# Patient Record
Sex: Male | Born: 1955 | Race: White | Hispanic: No | Marital: Married | Smoking: Never smoker
Health system: Southern US, Community
[De-identification: ages and names within clinical notes are randomized; demographics above are authoritative.]

## PROBLEM LIST (undated history)

## (undated) DIAGNOSIS — T7840XA Allergy, unspecified, initial encounter: Secondary | ICD-10-CM

## (undated) DIAGNOSIS — M545 Low back pain, unspecified: Secondary | ICD-10-CM

## (undated) DIAGNOSIS — IMO0002 Reserved for concepts with insufficient information to code with codable children: Secondary | ICD-10-CM

## (undated) DIAGNOSIS — G8929 Other chronic pain: Secondary | ICD-10-CM

## (undated) DIAGNOSIS — C61 Malignant neoplasm of prostate: Secondary | ICD-10-CM

## (undated) DIAGNOSIS — E7800 Pure hypercholesterolemia, unspecified: Secondary | ICD-10-CM

## (undated) DIAGNOSIS — K589 Irritable bowel syndrome without diarrhea: Secondary | ICD-10-CM

## (undated) DIAGNOSIS — J45909 Unspecified asthma, uncomplicated: Secondary | ICD-10-CM

## (undated) HISTORY — DX: Unspecified asthma, uncomplicated: J45.909

## (undated) HISTORY — DX: Other chronic pain: G89.29

## (undated) HISTORY — DX: Low back pain, unspecified: M54.50

## (undated) HISTORY — DX: Low back pain: M54.5

## (undated) HISTORY — PX: PILONIDAL CYST EXCISION: SHX744

## (undated) HISTORY — PX: CATARACT EXTRACTION: SUR2

## (undated) HISTORY — DX: Reserved for concepts with insufficient information to code with codable children: IMO0002

## (undated) HISTORY — PX: COLONOSCOPY: SHX174

## (undated) HISTORY — DX: Irritable bowel syndrome, unspecified: K58.9

---

## 2004-11-10 ENCOUNTER — Ambulatory Visit: Payer: Self-pay | Admitting: Internal Medicine

## 2005-04-23 ENCOUNTER — Ambulatory Visit: Payer: Self-pay | Admitting: Internal Medicine

## 2005-06-08 ENCOUNTER — Ambulatory Visit: Payer: Self-pay | Admitting: Internal Medicine

## 2005-08-31 ENCOUNTER — Ambulatory Visit: Payer: Self-pay | Admitting: Internal Medicine

## 2006-01-07 ENCOUNTER — Ambulatory Visit: Payer: Self-pay | Admitting: Gastroenterology

## 2006-01-18 ENCOUNTER — Ambulatory Visit: Payer: Self-pay | Admitting: Gastroenterology

## 2006-01-18 LAB — HM COLONOSCOPY

## 2007-08-04 ENCOUNTER — Ambulatory Visit: Payer: Self-pay | Admitting: Internal Medicine

## 2007-08-04 DIAGNOSIS — F528 Other sexual dysfunction not due to a substance or known physiological condition: Secondary | ICD-10-CM | POA: Insufficient documentation

## 2007-08-04 LAB — CONVERTED CEMR LAB
Bilirubin Urine: NEGATIVE
Glucose, Urine, Semiquant: NEGATIVE
Ketones, urine, test strip: NEGATIVE
Nitrite: NEGATIVE
Protein, U semiquant: NEGATIVE
Specific Gravity, Urine: 1.015
Urobilinogen, UA: 0.2
WBC Urine, dipstick: NEGATIVE
pH: 5.5

## 2007-08-06 LAB — CONVERTED CEMR LAB
ALT: 26 units/L (ref 0–53)
AST: 26 units/L (ref 0–37)
Albumin: 4.3 g/dL (ref 3.5–5.2)
Alkaline Phosphatase: 38 units/L — ABNORMAL LOW (ref 39–117)
BUN: 17 mg/dL (ref 6–23)
Basophils Absolute: 0 10*3/uL (ref 0.0–0.1)
Basophils Relative: 0.1 % (ref 0.0–1.0)
Bilirubin, Direct: 0.1 mg/dL (ref 0.0–0.3)
CO2: 31 meq/L (ref 19–32)
Calcium: 9.7 mg/dL (ref 8.4–10.5)
Chloride: 102 meq/L (ref 96–112)
Cholesterol: 183 mg/dL (ref 0–200)
Creatinine, Ser: 1 mg/dL (ref 0.4–1.5)
Eosinophils Absolute: 0.1 10*3/uL (ref 0.0–0.6)
Eosinophils Relative: 1.1 % (ref 0.0–5.0)
GFR calc Af Amer: 101 mL/min
GFR calc non Af Amer: 83 mL/min
Glucose, Bld: 116 mg/dL — ABNORMAL HIGH (ref 70–99)
HCT: 43.4 % (ref 39.0–52.0)
HDL: 61.1 mg/dL (ref >39.0)
Hemoglobin: 15.2 g/dL (ref 13.0–17.0)
LDL Cholesterol: 101 mg/dL — ABNORMAL HIGH (ref 0–99)
Lymphocytes Relative: 13.3 % (ref 12.0–46.0)
MCHC: 34.9 g/dL (ref 30.0–36.0)
MCV: 88.9 fL (ref 78.0–100.0)
Monocytes Absolute: 0.4 10*3/uL (ref 0.2–0.7)
Monocytes Relative: 4.3 % (ref 3.0–11.0)
Neutro Abs: 7.6 10*3/uL (ref 1.4–7.7)
Neutrophils Relative %: 81.2 % — ABNORMAL HIGH (ref 43.0–77.0)
PSA: 0.86 ng/mL (ref 0.10–4.00)
Platelets: 221 10*3/uL (ref 150–400)
Potassium: 4.5 meq/L (ref 3.5–5.1)
RBC: 4.88 M/uL (ref 4.22–5.81)
RDW: 11.9 % (ref 11.5–14.6)
Sodium: 140 meq/L (ref 135–145)
TSH: 1.36 microintl units/mL (ref 0.35–5.50)
Total Bilirubin: 0.7 mg/dL (ref 0.3–1.2)
Total CHOL/HDL Ratio: 3
Total Protein: 7.3 g/dL (ref 6.0–8.3)
Triglycerides: 105 mg/dL (ref 0–149)
VLDL: 21 mg/dL (ref 0–40)
WBC: 9.3 10*3/uL (ref 4.5–10.5)

## 2007-08-15 ENCOUNTER — Telehealth: Payer: Self-pay | Admitting: Internal Medicine

## 2007-08-18 ENCOUNTER — Encounter: Payer: Self-pay | Admitting: Internal Medicine

## 2007-08-20 LAB — CONVERTED CEMR LAB
Sex Hormone Binding: 42 nmol/L (ref 13–71)
Testosterone Free: 90.5 pg/mL (ref 47.0–244.0)
Testosterone-% Free: 1.8 % (ref 1.6–2.9)
Testosterone: 501.57 ng/dL (ref 350–890)

## 2007-08-25 ENCOUNTER — Telehealth: Payer: Self-pay | Admitting: Internal Medicine

## 2007-09-01 ENCOUNTER — Telehealth: Payer: Self-pay | Admitting: Internal Medicine

## 2007-11-04 ENCOUNTER — Telehealth: Payer: Self-pay | Admitting: Internal Medicine

## 2008-09-02 ENCOUNTER — Telehealth: Payer: Self-pay | Admitting: Internal Medicine

## 2008-09-20 ENCOUNTER — Encounter
Admission: RE | Admit: 2008-09-20 | Discharge: 2008-09-20 | Payer: Self-pay | Admitting: Physical Medicine and Rehabilitation

## 2008-09-27 ENCOUNTER — Encounter: Payer: Self-pay | Admitting: Internal Medicine

## 2008-10-18 ENCOUNTER — Ambulatory Visit: Payer: Self-pay | Admitting: Internal Medicine

## 2008-10-18 LAB — CONVERTED CEMR LAB
BUN: 21 mg/dL (ref 6–23)
Basophils Absolute: 0 10*3/uL (ref 0.0–0.1)
Basophils Relative: 0.4 % (ref 0.0–3.0)
CO2: 31 meq/L (ref 19–32)
Calcium: 9.3 mg/dL (ref 8.4–10.5)
Chloride: 106 meq/L (ref 96–112)
Cholesterol: 178 mg/dL (ref 0–200)
Creatinine, Ser: 1 mg/dL (ref 0.4–1.5)
Eosinophils Absolute: 0.3 10*3/uL (ref 0.0–0.7)
Eosinophils Relative: 5.6 % — ABNORMAL HIGH (ref 0.0–5.0)
GFR calc non Af Amer: 82.62 mL/min (ref >60)
Glucose, Bld: 100 mg/dL — ABNORMAL HIGH (ref 70–99)
HCT: 42.6 % (ref 39.0–52.0)
HDL: 60.4 mg/dL (ref >39.00)
Hemoglobin: 14.6 g/dL (ref 13.0–17.0)
LDL Cholesterol: 101 mg/dL — ABNORMAL HIGH (ref 0–99)
Lymphocytes Relative: 30 % (ref 12.0–46.0)
Lymphs Abs: 1.6 10*3/uL (ref 0.7–4.0)
MCHC: 34.4 g/dL (ref 30.0–36.0)
MCV: 89.3 fL (ref 78.0–100.0)
Monocytes Absolute: 0.4 10*3/uL (ref 0.1–1.0)
Monocytes Relative: 7.5 % (ref 3.0–12.0)
Neutro Abs: 3 10*3/uL (ref 1.4–7.7)
Neutrophils Relative %: 56.5 % (ref 43.0–77.0)
Platelets: 171 10*3/uL (ref 150.0–400.0)
Potassium: 4.9 meq/L (ref 3.5–5.1)
RBC: 4.77 M/uL (ref 4.22–5.81)
RDW: 12.2 % (ref 11.5–14.6)
Sodium: 142 meq/L (ref 135–145)
TSH: 1.07 microintl units/mL (ref 0.35–5.50)
Total CHOL/HDL Ratio: 3
Triglycerides: 82 mg/dL (ref 0.0–149.0)
VLDL: 16.4 mg/dL (ref 0.0–40.0)
WBC: 5.3 10*3/uL (ref 4.5–10.5)

## 2008-12-27 ENCOUNTER — Ambulatory Visit: Payer: Self-pay | Admitting: Internal Medicine

## 2008-12-30 LAB — CONVERTED CEMR LAB
ALT: 20 units/L (ref 0–53)
AST: 24 units/L (ref 0–37)
Albumin: 4.1 g/dL (ref 3.5–5.2)
Alkaline Phosphatase: 50 units/L (ref 39–117)
Bilirubin, Direct: 0.1 mg/dL (ref 0.0–0.3)
PSA: 0.8 ng/mL (ref 0.10–4.00)
Total Bilirubin: 0.8 mg/dL (ref 0.3–1.2)
Total Protein: 7.3 g/dL (ref 6.0–8.3)

## 2009-11-18 ENCOUNTER — Ambulatory Visit: Payer: Self-pay | Admitting: Internal Medicine

## 2009-11-23 ENCOUNTER — Encounter: Payer: Self-pay | Admitting: Internal Medicine

## 2010-01-18 ENCOUNTER — Telehealth: Payer: Self-pay | Admitting: Internal Medicine

## 2010-03-02 ENCOUNTER — Ambulatory Visit: Payer: Self-pay | Admitting: Internal Medicine

## 2010-03-02 LAB — CONVERTED CEMR LAB
ALT: 21 units/L (ref 0–53)
AST: 24 units/L (ref 0–37)
Albumin: 4.3 g/dL (ref 3.5–5.2)
Alkaline Phosphatase: 36 units/L — ABNORMAL LOW (ref 39–117)
BUN: 21 mg/dL (ref 6–23)
Basophils Absolute: 0 10*3/uL (ref 0.0–0.1)
Basophils Relative: 0.6 % (ref 0.0–3.0)
Bilirubin Urine: NEGATIVE
Bilirubin, Direct: 0.2 mg/dL (ref 0.0–0.3)
Blood in Urine, dipstick: NEGATIVE
CO2: 32 meq/L (ref 19–32)
Calcium: 9.3 mg/dL (ref 8.4–10.5)
Chloride: 103 meq/L (ref 96–112)
Cholesterol: 176 mg/dL (ref 0–200)
Creatinine, Ser: 1.1 mg/dL (ref 0.4–1.5)
Eosinophils Absolute: 0.3 10*3/uL (ref 0.0–0.7)
Eosinophils Relative: 7.1 % — ABNORMAL HIGH (ref 0.0–5.0)
GFR calc non Af Amer: 73.65 mL/min (ref >60)
Glucose, Bld: 92 mg/dL (ref 70–99)
Glucose, Urine, Semiquant: NEGATIVE
HCT: 43.4 % (ref 39.0–52.0)
HDL: 52.5 mg/dL (ref >39.00)
Hemoglobin: 14.6 g/dL (ref 13.0–17.0)
Ketones, urine, test strip: NEGATIVE
LDL Cholesterol: 110 mg/dL — ABNORMAL HIGH (ref 0–99)
Lymphocytes Relative: 29.1 % (ref 12.0–46.0)
Lymphs Abs: 1.4 10*3/uL (ref 0.7–4.0)
MCHC: 33.8 g/dL (ref 30.0–36.0)
MCV: 90 fL (ref 78.0–100.0)
Monocytes Absolute: 0.3 10*3/uL (ref 0.1–1.0)
Monocytes Relative: 7 % (ref 3.0–12.0)
Neutro Abs: 2.6 10*3/uL (ref 1.4–7.7)
Neutrophils Relative %: 56.2 % (ref 43.0–77.0)
Nitrite: NEGATIVE
PSA: 0.66 ng/mL (ref 0.10–4.00)
Platelets: 193 10*3/uL (ref 150.0–400.0)
Potassium: 4.9 meq/L (ref 3.5–5.1)
RBC: 4.82 M/uL (ref 4.22–5.81)
RDW: 13.4 % (ref 11.5–14.6)
Sodium: 140 meq/L (ref 135–145)
Specific Gravity, Urine: 1.025
TSH: 1.37 microintl units/mL (ref 0.35–5.50)
Total Bilirubin: 0.8 mg/dL (ref 0.3–1.2)
Total CHOL/HDL Ratio: 3
Total Protein: 6.8 g/dL (ref 6.0–8.3)
Triglycerides: 69 mg/dL (ref 0.0–149.0)
Urobilinogen, UA: 0.2
VLDL: 13.8 mg/dL (ref 0.0–40.0)
WBC Urine, dipstick: NEGATIVE
WBC: 4.7 10*3/uL (ref 4.5–10.5)
pH: 6

## 2010-03-15 ENCOUNTER — Ambulatory Visit: Payer: Self-pay | Admitting: Internal Medicine

## 2010-08-15 NOTE — Progress Notes (Signed)
Summary: refill simvastatin  Phone Note Call from Patient Call back at Home Phone [phone]   Caller: pt live Call For: Montrel Donahoe Summary of Call: simvustatin 20 mg would like 90 day rx mailed to his home for his mail in service  [address] [place] [address]  Initial call taken by: Roselle Locus,  November 04, 2007 9:35 AM  Follow-up for Phone Call        Rx will be at front desk for pick up. Patient notified.  Follow-up by: Gladis Riffle, RN,  November 04, 2007 1:14 PM      Prescriptions: ZOCOR 20 MG TABS (SIMVASTATIN) Take 1 tablet by mouth at bedtime  #100 x 3   Entered by:   Gladis Riffle, RN   Authorized by:   Birdie Sons MD   Signed by:   Gladis Riffle, RN on 11/04/2007   Method used:   Print then Give to Patient   RxID:   [id number]

## 2010-08-15 NOTE — Assessment & Plan Note (Signed)
Summary: cpx/ok per ellen/jls/PT RESCD//CCM   Vital Signs:  Patient profile:   55 year old male Height:      74 inches Weight:      212 pounds BMI:     27.32 Pulse rate:   64 / minute Resp:     12 per minute BP sitting:   114 / 70  (left arm)  Vitals Entered By: Gladis Riffle, RN (December 27, 2008 9:10 AM)  History of Present Illness: cpx  Current Problems (verified): 1)  Erectile Dysfunction  (ICD-302.72) 2)  Preventive Health Care  (ICD-V70.0)  Current Medications (verified): 1)  Simvastatin 20 Mg Tabs (Simvastatin) .... Take 1 Tablet By Mouth At Bedtime 2)  Citalopram Hydrobromide 20 Mg  Tabs (Citalopram Hydrobromide) .... One By Mouth Daily  Allergies: 1)  ! Codeine  Comments:  Nurse/Medical Assistant: cpx, labs done--recent diagnosis of herniated disc managed by PT The patient's medications and allergies were reviewed with the patient and were updated in the Medication and Allergy Lists. Gladis Riffle, RN (December 27, 2008 9:12 AM)  Past History:  Past Medical History: Last updated: 03/07/2007 ? IBS Low back pain-chronic  Family History: Last updated: 08/04/2007 Family History Other cancer-breast-mother still living Family History of Prostate CA 1st degree relative <50-prostate CA--still living  Social History: Last updated: 08/04/2007 Married Regular exercise-yes [place]-occupation  Risk Factors: Exercise: yes (03/07/2007)  Past Surgical History: Pilonidal cyst-age 39  Review of Systems       All other systems reviewed and were negative    Impression & Recommendations:  Problem # 1:  PREVENTIVE HEALTH CARE (ICD-V70.0)  helath maint utd regular exercise low fat diet labs today  Orders: UA Dipstick w/o Micro (automated)  (81003) Venipuncture (32355) TLB-Lipid Panel (80061-LIPID) TLB-BMP (Basic Metabolic Panel-BMET) (80048-METABOL) TLB-CBC Platelet - w/Differential (85025-CBCD) TLB-Hepatic/Liver Function Pnl (80076-HEPATIC) TLB-TSH  (Thyroid Stimulating Hormone) (84443-TSH) TLB-PSA (Prostate Specific Antigen) (84153-PSA)  Problem # 2:  ERECTILE DYSFUNCTION (ICD-302.72)  trial cialis written rx given 5mg  he will take 5- 20 mg as needed  side effects discussed  His updated medication list for this problem includes:    Cialis 5 Mg Tabs (Tadalafil) .Marland Kitchen... Take 1-4 tablet by mouth once a day or as directed  Complete Medication List: 1)  Simvastatin 20 Mg Tabs (Simvastatin) .... Take 1 tablet by mouth at bedtime 2)  Citalopram Hydrobromide 20 Mg Tabs (Citalopram hydrobromide) .... One by mouth daily 3)  Cialis 5 Mg Tabs (Tadalafil) .... Take 1-4 tablet by mouth once a day or as directed  Prescriptions: CIALIS 5 MG TABS (TADALAFIL) Take 1-4 tablet by mouth once a day or as directed  #30 x 11   Entered and Authorized by:   Birdie Sons MD   Signed by:   Birdie Sons MD on 12/27/2008   Method used:   Samples Given   RxID:   [id number]    Tetanus/Td Immunization History:    Tetanus/Td # 1:  Td (01/20/2008)  Physical Exam General Appearance: well developed, well nourished, no acute distress Eyes: conjunctiva and lids normal, PERRL, EOMI,  Ears, Nose, Mouth, Throat: TM clear, nares clear, oral exam WNL Neck: supple, no lymphadenopathy, no thyromegaly, no JVD Respiratory: clear to auscultation and percussion, respiratory effort normal Cardiovascular: regular rate and rhythm, S1-S2, no murmur, rub or gallop, no bruits, peripheral pulses normal and symmetric, no cyanosis, clubbing, edema or varicosities Chest: no scars, masses, tenderness; no asymmetry, skin changes, nipple discharge, no gynecomastia   Gastrointestinal: soft, non-tender; no hepatosplenomegaly, masses;  active bowel sounds all quadrants, ; no masses, tenderness, hemorrhoids  Genitourinary: no hernia, testicular mass, penile discharge, priapism or prostate enlargement Lymphatic: no cervical, axillary or inguinal adenopathy Musculoskeletal: gait  normal, muscle tone and strength WNL, no joint swelling, effusions, discoloration, crepitus  Skin: clear, good turgor, color WNL, no rashes, lesions, or ulcerations Neurologic: normal mental status, normal reflexes, normal strength, sensation, and motion Psychiatric: alert; oriented to person, place and time Other Exam:

## 2010-08-15 NOTE — Assessment & Plan Note (Signed)
Summary: cpx/njr pt decline cpx labs/njr   Vital Signs:  Patient Profile:   55 Years Old Male Height:     74 inches (187.96 cm) Weight:      210 pounds (95.45 kg) Temp:     98.4 degrees F (36.89 degrees C) oral Pulse rate:   64 / minute BP sitting:   134 / 68  (left arm)  Pt. in pain?   no  Vitals Entered By: Arcola Jansky, RN (August 04, 2007 2:16 PM)                  Chief Complaint:  CPX .  History of Present Illness: cpx complains of progressive erectile dysfunction for several months to two years.  Current Allergies (reviewed today): ! CODEINE  Past Medical History:    Reviewed history from 03/07/2007 and no changes required:       ? IBS       Low back pain-chronic  Past Surgical History:    Reviewed history from 03/07/2007 and no changes required:       Pilonidal cyst-age 72       Colonoscopy-01/18/2006   Family History:    Reviewed history from 03/07/2007 and no changes required:       Family History Other cancer-breast-mother still living       Family History of Prostate CA 1st degree relative <50-prostate CA--still living  Social History:    Reviewed history from 03/07/2007 and no changes required:       Married       Regular exercise-yes       [place]-occupation   Risk Factors:  Colonoscopy History:     Date of Last Colonoscopy:  01/18/2006    Results:  normal    Review of Systems       no other complaints in a complete ROS      Impression & Recommendations:  Problem # 1:  PREVENTIVE HEALTH CARE (ICD-V70.0) 336- [phone]  Problem # 2:  ERECTILE DYSFUNCTION (ICD-302.72)  Orders: T-Testosterone, Free and Total [phone])   Complete Medication List: 1)  Bentyl 20 Mg Tabs (Dicyclomine hcl) .... Take 1 tablet by mouth once a day as needed; 2)  Prozac 20 Mg Caps (Fluoxetine hcl) .... Take 1 capsule by mouth once a day 3)  Zocor 20 Mg Tabs (Simvastatin) .... Take 1 tablet by mouth at bedtime  Other Orders:  Venipuncture (19147) TLB-Lipid Panel (80061-LIPID) TLB-BMP (Basic Metabolic Panel-BMET) (80048-METABOL) TLB-CBC Platelet - w/Differential (85025-CBCD) TLB-Hepatic/Liver Function Pnl (80076-HEPATIC) TLB-TSH (Thyroid Stimulating Hormone) (84443-TSH) TLB-PSA (Prostate Specific Antigen) (84153-PSA) UA Dipstick w/o Micro (81002)     ]Physical Exam General Appearance: well developed, well nourished, no acute distress Eyes: conjunctiva and lids normal, PERRL, EOMI, fundi WNL Ears, Nose, Mouth, Throat: TM clear, nares clear, oral exam WNL Neck: supple, no lymphadenopathy, no thyromegaly, no JVD Respiratory: clear to auscultation and percussion, respiratory effort normal Cardiovascular: regular rate and rhythm, S1-S2, no murmur, rub or gallop, no bruits, peripheral pulses normal and symmetric, no cyanosis, clubbing, edema or varicosities Chest: no scars, masses, tenderness; no asymmetry, skin changes, nipple discharge, no gynecomastia   Gastrointestinal: soft, non-tender; no hepatosplenomegaly, masses; active bowel sounds all quadrants, ; no masses, tenderness, hemorrhoids  Genitourinary: no hernia, testicular mass,  or prostate enlargement Lymphatic: no cervical, axillary or inguinal adenopathy Musculoskeletal: gait normal, muscle tone and strength WNL, no joint swelling, effusions, discoloration, crepitus  Skin: clear, good turgor, color WNL, no rashes, lesions, or ulcerations Neurologic: normal mental  status, normal reflexes, normal strength, sensation, and motion Psychiatric: alert; oriented to person, place and time Other Exam:      Preventive Care Screening  Colonoscopy:    Date:  01/18/2006    Next Due:  01/2016    Results:  normal   Laboratory Results   Urine Tests    Routine Urinalysis   Color: yellow Appearance: Clear Glucose: negative   (Normal Range: Negative) Bilirubin: negative   (Normal Range: Negative) Ketone: negative   (Normal Range: Negative) Spec.  Gravity: 1.015   (Normal Range: 1.003-1.035) Blood: trace-intact   (Normal Range: Negative) pH: 5.5   (Normal Range: 5.0-8.0) Protein: negative   (Normal Range: Negative) Urobilinogen: 0.2   (Normal Range: 0-1) Nitrite: negative   (Normal Range: Negative) Leukocyte Esterace: negative   (Normal Range: Negative)    Comments: ...................................................................Milica Zimonjic  August 04, 2007 3:07 PM

## 2010-08-15 NOTE — Medication Information (Signed)
Summary: Denial of Coverage for Cialis  Denial of Coverage for Cialis   Imported By: Maryln Gottron 12/01/2009 15:39:06  _____________________________________________________________________  External Attachment:    Type:   Image     Comment:   External Document

## 2010-08-15 NOTE — Assessment & Plan Note (Signed)
Summary: cpx/njr   Vital Signs:  Patient profile:   55 year old male Height:      74 inches Weight:      220 pounds BMI:     28.35 Pulse rate:   72 / minute Pulse rhythm:   regular Resp:     12 per minute BP sitting:   108 / 70  (left arm) Cuff size:   regular  Vitals Entered By: Gladis Riffle, RN (March 15, 2010 9:15 AM)  Nutrition Counseling: Patient's BMI is greater than 25 and therefore counseled on weight management options. CC: cpx, labs done--has stopped running due to disc issues Is Patient Diabetic? No   CC:  cpx and labs done--has stopped running due to disc issues.  History of Present Illness: cpx  chronic back pain---better with exercise  Preventive Screening-Counseling & Management  Alcohol-Tobacco     Smoking Status: never  Current Problems (verified): 1)  Erectile Dysfunction  (ICD-302.72) 2)  Preventive Health Care  (ICD-V70.0)  Current Medications (verified): 1)  Simvastatin 20 Mg Tabs (Simvastatin) .... Take 1 Tablet By Mouth At Bedtime 2)  Citalopram Hydrobromide 20 Mg  Tabs (Citalopram Hydrobromide) .... One By Mouth Daily 3)  Cialis 20 Mg Tabs (Tadalafil) .Marland Kitchen.. 1 Tablet Every Other Day As Needed For Erectile Dysfunction  Allergies: 1)  ! Codeine  Past History:  Past Medical History: Last updated: 11/18/2009 ? IBS Low back pain-chronic herniated disc L1  Past Surgical History: Last updated: 12/27/2008 Pilonidal cyst-age 57  Family History: Last updated: 08/04/2007 Family History Other cancer-breast-mother still living Family History of Prostate CA 1st degree relative <50-prostate CA--still living  Social History: Last updated: 08/04/2007 Married Regular exercise-yes [place]occupation  Risk Factors: Exercise: yes (03/07/2007)  Risk Factors: Smoking Status: never (03/15/2010)   Impression & Recommendations:  Problem # 1:  PREVENTIVE HEALTH CARE (ICD-V70.0) health maint utd   Problem # 2:  ERECTILE DYSFUNCTION  (ICD-302.72)  His updated medication list for this problem includes:    Cialis 20 Mg Tabs (Tadalafil) .Marland Kitchen... 1 tablet every other day as needed for erectile dysfunction  Complete Medication List: 1)  Simvastatin 20 Mg Tabs (Simvastatin) .... Take 1 tablet by mouth at bedtime 2)  Citalopram Hydrobromide 20 Mg Tabs (Citalopram hydrobromide) .... One by mouth daily 3)  Cialis 20 Mg Tabs (Tadalafil) .Marland Kitchen.. 1 tablet every other day as needed for erectile dysfunction   Physical Exam General Appearance: well developed, well nourished, no acute distress Eyes: conjunctiva and lids normal, PERRL, EOMI,  Ears, Nose, Mouth, Throat: TM clear, nares clear, oral exam WNL Neck: supple, no lymphadenopathy, no thyromegaly, no JVD Respiratory: clear to auscultation and percussion, respiratory effort normal Cardiovascular: regular rate and rhythm, S1-S2, no murmur, rub or gallop, no bruits, peripheral pulses normal and symmetric, no cyanosis, clubbing, edema or varicosities Chest: no scars, masses, tenderness; no asymmetry, skin changes,  Gastrointestinal: soft, non-tender; no hepatosplenomegaly, masses; active bowel sounds all quadrants, guaiac negative stool; no masses, tenderness, hemorrhoids  Genitourinary: no hernia, testicular mass,or prostate enlargement Lymphatic: no cervical, axillary or inguinal adenopathy Musculoskeletal: gait normal, muscle tone and strength WNL, no joint swelling, effusions, discoloration, crepitus  Skin: clear, good turgor, color WNL, no rashes, lesions, or ulcerations Neurologic: normal mental status, normal reflexes, normal strength, sensation, and motion Psychiatric: alert; oriented to person, place and time Other Exam:

## 2010-08-15 NOTE — Progress Notes (Signed)
Summary: refill simvastatin and clonazepam  Phone Note Refill Request Message from:  Patient on January 18, 2010 11:24 AM  Refills Requested: Medication #1:  SIMVASTATIN 20 MG TABS Take 1 tablet by mouth at bedtime  Medication #2:  CITALOPRAM HYDROBROMIDE 20 MG  TABS one by mouth daily rx's have expired - please send new rx to [facility]   90 days with RFs   pt can be reach on cell if any questions [phone]   Method Requested: Fax to Local Pharmacy Initial call taken by: Duard Brady LPN,  January 18, 1609 11:26 AM Caller: Patient Call For: Birdie Sons MD Reason for Call: Refill Medication    Prescriptions: CITALOPRAM HYDROBROMIDE 20 MG  TABS (CITALOPRAM HYDROBROMIDE) one by mouth daily  #90 x 3   Entered by:   Gladis Riffle, RN   Authorized by:   Birdie Sons MD   Signed by:   Gladis Riffle, RN on 01/18/2010   Method used:   Faxed to ...       [address] [facility] (mail-order)             , [place]         Ph: [phone]       Fax: [phone]   RxID:   [id number] SIMVASTATIN 20 MG TABS (SIMVASTATIN) Take 1 tablet by mouth at bedtime  #90 x 3   Entered by:   Gladis Riffle, RN   Authorized by:   Birdie Sons MD   Signed by:   Gladis Riffle, RN on 01/18/2010   Method used:   Faxed to ...       [facility] (mail-order)             , [place]         Ph: [phone]       Fax: [phone]   RxID:   [id number]

## 2010-08-15 NOTE — Progress Notes (Signed)
Summary:  patient called back 2-18LMTCB 2-17 WCB 2-17  Phone Note Call from Patient   Caller: Patient Call For: [name] Summary of Call: States was told that if testosterone level was normal he would get a different dose of prozac.  Has been told was normal and got refill of prozac, but was same dose.  Now asking what to do--does he need a new dose or different medication? Initial call taken by: Gladis Riffle, RN,  September 01, 2007 1:00 PM  Follow-up for Phone Call        when finished with this refill, should try a different med. change to citalopram 20 mg by mouth once daily #30/11 refills Follow-up by: Birdie Sons MD,  September 01, 2007 9:05 PM  Additional Follow-up for Phone Call Additional follow up Details #1::        LMTCB ..................................................................Marland KitchenRudy Jew, RN  September 02, 2007 9:10 AM Reached wife.  She says he'll have to decide about changing meds & she'll have him call tomorrow. ..................................................................Marland KitchenRudy Jew, RN  September 02, 2007 3:21 PM     Additional Follow-up for Phone Call Additional follow up Details #2::    patient is returning your call [phone] ..................................................................Marland KitchenRoselle Locus  September 03, 2007 12:43 PM  New/Updated Medications: CITALOPRAM HYDROBROMIDE 20 MG  TABS (CITALOPRAM HYDROBROMIDE) one by mouth daily   Prescriptions: CITALOPRAM HYDROBROMIDE 20 MG  TABS (CITALOPRAM HYDROBROMIDE) one by mouth daily  #90 x 3   Entered by:   Lynann Beaver CMA   Authorized by:   Birdie Sons MD   Signed by:   Lynann Beaver CMA on 09/04/2007   Method used:   Print then Give to Patient   RxID:   [id number]  Spoke to pt and he needs this written and mailed.  Prescriptions printed and will mail after we get Dr. Agnes Lawrence signature.

## 2010-08-15 NOTE — Consult Note (Signed)
Summary: [facility]  [facility]   Imported By: Maryln Gottron 10/25/2008 13:51:04  _____________________________________________________________________  External Attachment:    Type:   Image     Comment:   External Document

## 2010-08-15 NOTE — Assessment & Plan Note (Signed)
Summary: ? bronchitis//ccm   Vital Signs:  Patient profile:   55 year old male Temp:     98.4 degrees F oral Pulse rate:   68 / minute Pulse rhythm:   regular Resp:     12 per minute BP sitting:   104 / 68  (left arm) Cuff size:   regular  Vitals Entered By: Gladis Riffle, RN (Nov 18, 2009 12:07 PM) CC: c/o cough with deep chest pain and nasal congestion, SOB-- a little better today--hurts when works out Is Patient Diabetic? No   CC:  c/o cough with deep chest pain and nasal congestion and SOB-- a little better today--hurts when works out.  History of Present Illness: sinus congestion 1 week ago 3 days ago developed cough feels better today had some SOB a few days ago while exercising no fever no ill contacts   All other systems reviewed and were negative   Preventive Screening-Counseling & Management  Alcohol-Tobacco     Smoking Status: never  Current Problems (verified): 1)  Erectile Dysfunction  (ICD-302.72) 2)  Preventive Health Care  (ICD-V70.0)  Current Medications (verified): 1)  Simvastatin 20 Mg Tabs (Simvastatin) .... Take 1 Tablet By Mouth At Bedtime 2)  Citalopram Hydrobromide 20 Mg  Tabs (Citalopram Hydrobromide) .... One By Mouth Daily 3)  Cialis 20 Mg Tabs (Tadalafil) .Marland Kitchen.. 1 Tablet Every Other Day As Needed For Erectile Dysfunction  Allergies: 1)  ! Codeine  Past History:  Past Surgical History: Last updated: 12/27/2008 Pilonidal cyst-age 23  Family History: Last updated: 08/04/2007 Family History Other cancer-breast-mother still living Family History of Prostate CA 1st degree relative <50-prostate CA--still living  Social History: Last updated: 08/04/2007 Married Regular exercise-yes [place]-occupation  Risk Factors: Exercise: yes (03/07/2007)  Risk Factors: Smoking Status: never (11/18/2009)  Past Medical History: ? IBS Low back pain-chronic herniated disc L1  Social History: Smoking Status:  never  Review of  Systems       All other systems reviewed and were negative   Physical Exam  Head:  normocephalic and atraumatic.   Eyes:  pupils equal and pupils round.   Ears:  R ear normal and L ear normal.   Lungs:  Normal respiratory effort, chest expands symmetrically. Lungs are clear to auscultation, no crackles or wheezes. Heart:  normal rate and regular rhythm.     Impression & Recommendations:  Problem # 1:  URI (ICD-465.9) no evidence of bacterial infection. call for any concerns, increased sxs, fever, persistence of sxs, wheeze, SOB.   Complete Medication List: 1)  Simvastatin 20 Mg Tabs (Simvastatin) .... Take 1 tablet by mouth at bedtime 2)  Citalopram Hydrobromide 20 Mg Tabs (Citalopram hydrobromide) .... One by mouth daily 3)  Cialis 20 Mg Tabs (Tadalafil) .Marland Kitchen.. 1 tablet every other day as needed for erectile dysfunction Prescriptions: CIALIS 20 MG TABS (TADALAFIL) 1 tablet every other day as needed for erectile dysfunction  #10 x 11   Entered and Authorized by:   Birdie Sons MD   Signed by:   Birdie Sons MD on 11/18/2009   Method used:   Print then Give to Patient   RxID:   [id number]

## 2010-08-15 NOTE — Progress Notes (Signed)
Summary: Rx request for Prozac  Phone Note Call from Patient   Caller: Patient Call For: Edward Lee Summary of Call: Pt called, asked question:  "received word that my testosterone is normal".  So, now requesting RX for Prozac if OK with Dr Cato Mulligan. Pt requesting mail-order 90 day supply, please call [phone] (cell) when ready to pick-up.  OK to leave a detailed message. Initial call taken by: Sid Falcon LPN,  August 25, 2007 12:46 PM  Follow-up for Phone Call        prozac 20 mg by mouth once daily #100/3 refills Follow-up by: Birdie Sons MD,  August 25, 2007 2:31 PM  Additional Follow-up for Phone Call Additional follow up Details #1::        Rx printed, signed for pt.  Called pt to inform his mail-order RX is ready for pick-up Additional Follow-up by: Sid Falcon LPN,  August 25, 2007 3:02 PM    New/Updated Medications: PROZAC 20 MG  CAPS (FLUOXETINE HCL) one by mouth daily   Prescriptions: PROZAC 20 MG  CAPS (FLUOXETINE HCL) one by mouth daily  #100 x 3   Entered by:   Sid Falcon LPN   Authorized by:   Birdie Sons MD   Signed by:   Sid Falcon LPN on 16/04/9603   Method used:   Print then Give to Patient   RxID:   [id number]

## 2010-09-05 ENCOUNTER — Encounter: Payer: Self-pay | Admitting: Internal Medicine

## 2010-09-06 ENCOUNTER — Encounter: Payer: Self-pay | Admitting: Internal Medicine

## 2010-09-06 ENCOUNTER — Ambulatory Visit (INDEPENDENT_AMBULATORY_CARE_PROVIDER_SITE_OTHER): Payer: Private Health Insurance - Indemnity | Admitting: Internal Medicine

## 2010-09-06 VITALS — BP 112/74 | HR 72 | Temp 98.6°F | Ht 74.0 in | Wt 225.0 lb

## 2010-09-06 DIAGNOSIS — F4310 Post-traumatic stress disorder, unspecified: Secondary | ICD-10-CM | POA: Insufficient documentation

## 2010-09-06 DIAGNOSIS — F39 Unspecified mood [affective] disorder: Secondary | ICD-10-CM

## 2010-09-06 MED ORDER — TADALAFIL 20 MG PO TABS: 20.0000 mg | ORAL_TABLET | ORAL | Status: DC

## 2010-09-06 MED ORDER — MELOXICAM 15 MG PO TABS: 15.0000 mg | ORAL_TABLET | Freq: Every day | ORAL | Status: DC

## 2010-09-06 MED ORDER — LAMOTRIGINE 100 MG PO TABS: 100.0000 mg | ORAL_TABLET | Freq: Every day | ORAL | Status: DC
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
## 2010-09-06 NOTE — Progress Notes (Signed)
  Subjective:    Patient ID: Edward Lee, male    DOB: 1956-02-17, 55 y.o.   MRN: [id number]  HPI  Back pain---started after prolonged standing---has happened before. Has had NS eval previously. Better with PT and meloxicam.    Review of Systems     Objective:   Physical Exam        Assessment & Plan:

## 2010-09-07 ENCOUNTER — Ambulatory Visit: Payer: Self-pay | Admitting: Internal Medicine

## 2010-10-31 ENCOUNTER — Encounter: Payer: Self-pay | Admitting: Internal Medicine

## 2010-10-31 ENCOUNTER — Ambulatory Visit (INDEPENDENT_AMBULATORY_CARE_PROVIDER_SITE_OTHER): Payer: Private Health Insurance - Indemnity | Admitting: Internal Medicine

## 2010-10-31 DIAGNOSIS — F39 Unspecified mood [affective] disorder: Secondary | ICD-10-CM

## 2010-10-31 MED ORDER — LAMOTRIGINE 100 MG PO TABS: 100.0000 mg | ORAL_TABLET | Freq: Every day | ORAL | Status: DC

## 2010-10-31 MED ORDER — CITALOPRAM HYDROBROMIDE 20 MG PO TABS: 20.0000 mg | ORAL_TABLET | Freq: Every day | ORAL | Status: DC

## 2010-10-31 MED ORDER — SIMVASTATIN 20 MG PO TABS: 20.0000 mg | ORAL_TABLET | Freq: Every day | ORAL | Status: DC

## 2010-10-31 NOTE — Patient Instructions (Signed)
In 3 months decrease Citalopram to 1/2 tablet daily.  See me in 6 months

## 2010-11-02 NOTE — Assessment & Plan Note (Signed)
Much improved on current dual meds Continue same for the time being  See me 6 months

## 2010-11-02 NOTE — Progress Notes (Signed)
  Subjective:    Patient ID: Edward Lee, male    DOB: 03-18-1956, 55 y.o.   MRN: [id number]  HPI  Feels significantly better Family members have noticed significant improvement in mood Pt admits to much less depression No significant side effects on eds  Past Medical History  Diagnosis Date  . IBS (irritable bowel syndrome)     ? possible  . Chronic lower back pain   . Herniated disc     L1   Past Surgical History  Procedure Date  . Pilonidal cyst excision 55 yrs old    reports that he has never smoked. He does not have any smokeless tobacco history on file. His alcohol and drug histories not on file. family history includes Cancer in his mother. Allergies  Allergen Reactions  . Codeine     REACTION: nausea vomiting     Review of Systems No N/V/. No other complaints    Objective:   Physical Exam  well-developed well-nourished male in no acute distress. HEENT exam atraumatic, normocephalic, neck supple without jugular venous distention. Chest clear to auscultation cardiac exam S1-S2 are regular. Abdominal exam overweight with bowel sounds, soft and nontender. Extremities no edema. Neurologic exam is alert with a normal gait.        Assessment & Plan:

## 2011-02-05 ENCOUNTER — Ambulatory Visit: Payer: Private Health Insurance - Indemnity | Admitting: Internal Medicine

## 2011-03-21 ENCOUNTER — Encounter: Payer: Self-pay | Admitting: Internal Medicine

## 2011-03-21 ENCOUNTER — Ambulatory Visit (INDEPENDENT_AMBULATORY_CARE_PROVIDER_SITE_OTHER): Payer: Private Health Insurance - Indemnity | Admitting: Internal Medicine

## 2011-03-21 VITALS — BP 110/76 | HR 76 | Temp 98.1°F | Ht 74.0 in | Wt 221.0 lb

## 2011-03-21 DIAGNOSIS — F39 Unspecified mood [affective] disorder: Secondary | ICD-10-CM

## 2011-03-21 DIAGNOSIS — E785 Hyperlipidemia, unspecified: Secondary | ICD-10-CM | POA: Insufficient documentation

## 2011-03-21 LAB — LIPID PANEL
Cholesterol: 164 mg/dL (ref 0–200)
HDL: 64.9 mg/dL (ref >39.00)
LDL Cholesterol: 86 mg/dL (ref 0–99)
Total CHOL/HDL Ratio: 3
Triglycerides: 67 mg/dL (ref 0.0–149.0)
VLDL: 13.4 mg/dL (ref 0.0–40.0)

## 2011-03-21 LAB — HEPATIC FUNCTION PANEL
ALT: 22 U/L (ref 0–53)
AST: 24 U/L (ref 0–37)
Albumin: 4.3 g/dL (ref 3.5–5.2)
Alkaline Phosphatase: 36 U/L — ABNORMAL LOW (ref 39–117)
Bilirubin, Direct: 0.1 mg/dL (ref 0.0–0.3)
Total Bilirubin: 0.7 mg/dL (ref 0.3–1.2)
Total Protein: 7.2 g/dL (ref 6.0–8.3)

## 2011-03-21 MED ORDER — LAMOTRIGINE 100 MG PO TABS: 100.0000 mg | ORAL_TABLET | Freq: Every day | ORAL | Status: DC

## 2011-03-25 ENCOUNTER — Encounter: Payer: Self-pay | Admitting: Internal Medicine

## 2011-03-25 NOTE — Assessment & Plan Note (Signed)
He will continue seeing a psychologist. He will increase lamotrigine 100 mg by mouth each bedtime. Continue same dose of citalopram.

## 2011-03-25 NOTE — Progress Notes (Signed)
  Subjective:    Patient ID: Edward Lee, male    DOB: 1955/08/21, 55 y.o.   MRN: [id number]  HPI  Patient comes in for followup. He has had mood disorder this done reasonably well treated he still notes some depressed mood and lack of energy. Reviewed previous note. He did not decrease citalopram. No side effects from medications.  Past Medical History  Diagnosis Date  . IBS (irritable bowel syndrome)     ? possible  . Chronic lower back pain   . Herniated disc     L1   Past Surgical History  Procedure Date  . Pilonidal cyst excision 55 yrs old    reports that he has never smoked. He does not have any smokeless tobacco history on file. His alcohol and drug histories not on file. family history includes Cancer in his mother. Allergies  Allergen Reactions  . Codeine     REACTION: nausea vomiting     Review of Systems    patient denies chest pain, shortness of breath, orthopnea. Denies lower extremity edema, abdominal pain, change in appetite, change in bowel movements. Patient denies rashes, musculoskeletal complaints. No other specific complaints in a complete review of systems.    Objective:   Physical Exam Well-developed male in no acute distress. HEENT exam atraumatic, normocephalic, neck supple. Affect is normal       Assessment & Plan:

## 2011-05-02 ENCOUNTER — Ambulatory Visit: Payer: Private Health Insurance - Indemnity | Admitting: Internal Medicine

## 2011-05-11 ENCOUNTER — Encounter: Payer: Self-pay | Admitting: Internal Medicine

## 2011-05-11 ENCOUNTER — Ambulatory Visit (INDEPENDENT_AMBULATORY_CARE_PROVIDER_SITE_OTHER): Payer: Private Health Insurance - Indemnity | Admitting: Internal Medicine

## 2011-05-11 DIAGNOSIS — F39 Unspecified mood [affective] disorder: Secondary | ICD-10-CM

## 2011-05-16 NOTE — Progress Notes (Signed)
  Subjective:    Patient ID: Edward Lee, male    DOB: 1955-10-10, 55 y.o.   MRN: [id number]  HPI  Patient comes in for followup of mood disorder. He's been feeling reasonably well. He is taking medications as directed. No significant side effects.  Past Medical History  Diagnosis Date  . IBS (irritable bowel syndrome)     ? possible  . Chronic lower back pain   . Herniated disc     L1   Past Surgical History  Procedure Date  . Pilonidal cyst excision 55 yrs old    reports that he has never smoked. He does not have any smokeless tobacco history on file. His alcohol and drug histories not on file. family history includes Cancer in his mother. Allergies  Allergen Reactions  . Codeine     REACTION: nausea vomiting     Review of Systems    patient denies chest pain, shortness of breath, orthopnea. Denies lower extremity edema, abdominal pain, change in appetite, change in bowel movements. Patient denies rashes, musculoskeletal complaints. No other specific complaints in a complete review of systems.    Objective:   Physical Exam  Well-developed male in no acute distress. Neck supple. Affect is normal.      Assessment & Plan:

## 2011-05-16 NOTE — Assessment & Plan Note (Signed)
Reviewed overview note. He's feeling well. We'll continue current medications. I told him would be okay if in the next 3 months he wanted to decrease the Celexa in half. He will make that decision and inform me of the results.

## 2011-09-07 ENCOUNTER — Telehealth: Payer: Self-pay | Admitting: Internal Medicine

## 2011-09-07 MED ORDER — CITALOPRAM HYDROBROMIDE 20 MG PO TABS: 20.0000 mg | ORAL_TABLET | Freq: Every day | ORAL | Status: DC

## 2011-09-07 NOTE — Telephone Encounter (Signed)
Ok x one year 

## 2011-09-07 NOTE — Telephone Encounter (Signed)
Rx last filled 10/31/10.  Pt last seen 05/11/11. Pls advise.

## 2011-09-07 NOTE — Telephone Encounter (Signed)
Rx faxed to [facility].

## 2011-09-07 NOTE — Telephone Encounter (Signed)
Pt called req refill of citalopram (CELEXA) 20 MG tablet to [facility] phone # [phone]

## 2011-11-28 ENCOUNTER — Ambulatory Visit (INDEPENDENT_AMBULATORY_CARE_PROVIDER_SITE_OTHER): Payer: Private Health Insurance - Indemnity | Admitting: Internal Medicine

## 2011-11-28 ENCOUNTER — Encounter: Payer: Self-pay | Admitting: Internal Medicine

## 2011-11-28 VITALS — BP 110/76 | HR 68 | Temp 98.0°F | Wt 188.0 lb

## 2011-11-28 DIAGNOSIS — R350 Frequency of micturition: Secondary | ICD-10-CM | POA: Insufficient documentation

## 2011-11-28 DIAGNOSIS — E785 Hyperlipidemia, unspecified: Secondary | ICD-10-CM

## 2011-11-28 LAB — POCT URINALYSIS DIPSTICK
Bilirubin, UA: NEGATIVE
Blood, UA: NEGATIVE
Glucose, UA: NEGATIVE
Ketones, UA: NEGATIVE
Leukocytes, UA: NEGATIVE
Nitrite, UA: NEGATIVE
Protein, UA: NEGATIVE
Spec Grav, UA: 1.01
Urobilinogen, UA: 0.2
pH, UA: 6.5

## 2011-11-28 LAB — LIPID PANEL
Cholesterol: 174 mg/dL (ref 0–200)
HDL: 71.5 mg/dL (ref >39.00)
LDL Cholesterol: 88 mg/dL (ref 0–99)
Total CHOL/HDL Ratio: 2
Triglycerides: 73 mg/dL (ref 0.0–149.0)
VLDL: 14.6 mg/dL (ref 0.0–40.0)

## 2011-11-28 LAB — PSA: PSA: 0.92 ng/mL (ref 0.10–4.00)

## 2011-11-28 MED ORDER — TAMSULOSIN HCL 0.4 MG PO CAPS: 0.4000 mg | ORAL_CAPSULE | Freq: Every day | ORAL | Status: DC

## 2011-11-28 NOTE — Progress Notes (Signed)
Patient ID: Edward Lee, male   DOB: 08-Jul-1956, 56 y.o.   MRN: [id number]  Frequent and urgent urination for  Months. No pain Nocturia x 4  No other complaints Note purposeful weight loss!  Past Medical History  Diagnosis Date  . IBS (irritable bowel syndrome)     ? possible  . Chronic lower back pain   . Herniated disc     L1    History   Social History  . Marital Status: Married    Spouse Name: N/A    Number of Children: N/A  . Years of Education: N/A   Occupational History  . Not on file.   Social History Main Topics  . Smoking status: Never Smoker   . Smokeless tobacco: Not on file  . Alcohol Use: Not on file  . Drug Use: Not on file  . Sexually Active: Not on file   Other Topics Concern  . Not on file   Social History Narrative  . No narrative on file    Past Surgical History  Procedure Date  . Pilonidal cyst excision 56 yrs old    Family History  Problem Relation Age of Onset  . Cancer Mother     Breast    Allergies  Allergen Reactions  . Codeine     REACTION: nausea vomiting    Current Outpatient Prescriptions on File Prior to Visit  Medication Sig Dispense Refill  . citalopram (CELEXA) 20 MG tablet Take 1 tablet (20 mg total) by mouth daily.  90 tablet  3  . lamoTRIgine (LAMICTAL) 100 MG tablet Take 1 tablet (100 mg total) by mouth daily.  90 tablet  3  . simvastatin (ZOCOR) 20 MG tablet Take 1 tablet (20 mg total) by mouth at bedtime.  90 tablet  3  . tadalafil (CIALIS) 20 MG tablet Take 1 tablet (20 mg total) by mouth every other day. Prn for erectile dysfunction  10 tablet  3     patient denies chest pain, shortness of breath, orthopnea. Denies lower extremity edema, abdominal pain, change in appetite, change in bowel movements. Patient denies rashes, musculoskeletal complaints. No other specific complaints in a complete review of systems.   BP 110/76  Pulse 68  Temp(Src) 98 F (36.7 C) (Oral)  Wt 188 lb (85.276 kg)  well-developed well-nourished male in no acute distress. HEENT exam atraumatic, normocephalic, neck supple without jugular venous distention. Chest clear to auscultation cardiac exam S1-S2 are regular. Abdominal exam overweight with bowel sounds, soft and nontender. Extremities no edema. Neurologic exam is alert with a normal gait.

## 2011-11-28 NOTE — Assessment & Plan Note (Signed)
i suspect BPH but exam is not terribly impressive Trial flomax Side effects discussed

## 2011-11-30 ENCOUNTER — Telehealth: Payer: Self-pay | Admitting: Internal Medicine

## 2011-11-30 NOTE — Telephone Encounter (Addendum)
Pt is calling back for ua/bloodwork  results

## 2011-11-30 NOTE — Progress Notes (Signed)
Quick Note:  Pt informed ______ 

## 2011-12-03 NOTE — Telephone Encounter (Signed)
See lab note.  

## 2011-12-19 ENCOUNTER — Telehealth: Payer: Self-pay | Admitting: Internal Medicine

## 2011-12-19 NOTE — Telephone Encounter (Signed)
Error/njr °

## 2012-01-22 ENCOUNTER — Other Ambulatory Visit: Payer: Self-pay | Admitting: Internal Medicine

## 2012-09-01 ENCOUNTER — Telehealth: Payer: Self-pay | Admitting: Internal Medicine

## 2012-09-01 NOTE — Telephone Encounter (Signed)
Called to leave message for Dr Cato Mulligan to request increased Citalopram dose.  Counselor suggested PCP be contacted to request increased dose since he is on a low dose now. Reported Citalopram 20 mg daily is not helping his moods at all. Last office visit 11/28/11.  Prefers to use [facility] order pharmacy.  Please call back to advise if MD approves dose increase.

## 2012-09-02 NOTE — Telephone Encounter (Signed)
Ok to increase to 40 mg po qd

## 2012-09-04 MED ORDER — CITALOPRAM HYDROBROMIDE 40 MG PO TABS: 40.0000 mg | ORAL_TABLET | Freq: Every day | ORAL | Status: DC

## 2012-09-04 NOTE — Telephone Encounter (Signed)
Pt aware, he will double up on the 20 mg for right now and I have sent a new rx for the 40 mg into [facility]

## 2013-01-12 ENCOUNTER — Telehealth: Payer: Self-pay | Admitting: Internal Medicine

## 2013-01-12 NOTE — Telephone Encounter (Signed)
Pt needs an appt has not been seen in a year

## 2013-01-12 NOTE — Telephone Encounter (Signed)
Pt needs refill on simvastatin 20 mg #90 with refills call into to [facility] order pharm [phone]

## 2013-01-12 NOTE — Telephone Encounter (Signed)
lmom for pt to call back

## 2013-01-15 NOTE — Telephone Encounter (Signed)
lmom for pt to call back

## 2013-01-19 ENCOUNTER — Telehealth: Payer: Self-pay | Admitting: Internal Medicine

## 2013-01-19 MED ORDER — SIMVASTATIN 20 MG PO TABS: 20.0000 mg | ORAL_TABLET | Freq: Every day | ORAL | Status: DC

## 2013-01-19 MED ORDER — SIMVASTATIN 20 MG PO TABS: ORAL_TABLET | ORAL | Status: DC

## 2013-01-19 NOTE — Telephone Encounter (Signed)
rx sent in electronically 

## 2013-01-19 NOTE — Telephone Encounter (Signed)
Pt is sch for 02-06-13

## 2013-01-19 NOTE — Telephone Encounter (Signed)
PT called and stated that he would like enough of his simvastatin (ZOCOR) 20 MG tablet, called into [facility], to last him until his med check on 02/06/13. Please assist.

## 2013-02-06 ENCOUNTER — Ambulatory Visit (INDEPENDENT_AMBULATORY_CARE_PROVIDER_SITE_OTHER): Payer: Private Health Insurance - Indemnity | Admitting: Internal Medicine

## 2013-02-06 ENCOUNTER — Encounter: Payer: Self-pay | Admitting: Internal Medicine

## 2013-02-06 VITALS — BP 114/64 | HR 60 | Temp 98.1°F | Ht 74.0 in | Wt 189.0 lb

## 2013-02-06 DIAGNOSIS — E785 Hyperlipidemia, unspecified: Secondary | ICD-10-CM

## 2013-02-06 LAB — CBC WITH DIFFERENTIAL/PLATELET
Basophils Absolute: 0 10*3/uL (ref 0.0–0.1)
Basophils Relative: 0.4 % (ref 0.0–3.0)
Eosinophils Absolute: 0.3 10*3/uL (ref 0.0–0.7)
Eosinophils Relative: 6.7 % — ABNORMAL HIGH (ref 0.0–5.0)
HCT: 41.7 % (ref 39.0–52.0)
Hemoglobin: 13.9 g/dL (ref 13.0–17.0)
Lymphocytes Relative: 27 % (ref 12.0–46.0)
Lymphs Abs: 1.3 10*3/uL (ref 0.7–4.0)
MCHC: 33.4 g/dL (ref 30.0–36.0)
MCV: 91.5 fl (ref 78.0–100.0)
Monocytes Absolute: 0.3 10*3/uL (ref 0.1–1.0)
Monocytes Relative: 7.2 % (ref 3.0–12.0)
Neutro Abs: 2.8 10*3/uL (ref 1.4–7.7)
Neutrophils Relative %: 58.7 % (ref 43.0–77.0)
Platelets: 170 10*3/uL (ref 150.0–400.0)
RBC: 4.56 Mil/uL (ref 4.22–5.81)
RDW: 13.6 % (ref 11.5–14.6)
WBC: 4.7 10*3/uL (ref 4.5–10.5)

## 2013-02-06 LAB — BASIC METABOLIC PANEL
BUN: 18 mg/dL (ref 6–23)
CO2: 29 mEq/L (ref 19–32)
Calcium: 9.5 mg/dL (ref 8.4–10.5)
Chloride: 100 mEq/L (ref 96–112)
Creatinine, Ser: 1.2 mg/dL (ref 0.4–1.5)
GFR: 64.67 mL/min (ref >60.00)
Glucose, Bld: 79 mg/dL (ref 70–99)
Potassium: 5.5 mEq/L — ABNORMAL HIGH (ref 3.5–5.1)
Sodium: 136 mEq/L (ref 135–145)

## 2013-02-06 LAB — HEPATIC FUNCTION PANEL
ALT: 16 U/L (ref 0–53)
AST: 22 U/L (ref 0–37)
Albumin: 4.1 g/dL (ref 3.5–5.2)
Alkaline Phosphatase: 36 U/L — ABNORMAL LOW (ref 39–117)
Bilirubin, Direct: 0.1 mg/dL (ref 0.0–0.3)
Total Bilirubin: 0.7 mg/dL (ref 0.3–1.2)
Total Protein: 6.6 g/dL (ref 6.0–8.3)

## 2013-02-06 LAB — LIPID PANEL
Cholesterol: 163 mg/dL (ref 0–200)
HDL: 76.2 mg/dL (ref >39.00)
LDL Cholesterol: 69 mg/dL (ref 0–99)
Total CHOL/HDL Ratio: 2
Triglycerides: 87 mg/dL (ref 0.0–149.0)
VLDL: 17.4 mg/dL (ref 0.0–40.0)

## 2013-02-06 LAB — PSA: PSA: 0.75 ng/mL (ref 0.10–4.00)

## 2013-02-06 NOTE — Assessment & Plan Note (Signed)
Check labs 

## 2013-02-06 NOTE — Progress Notes (Signed)
Mood disorder- doing well saw psychologist  Lipids-- needs f/u  He is taking great care of himself  Reviewed pmh, psh, meds   patient denies chest pain, shortness of breath, orthopnea. Denies lower extremity edema, abdominal pain, change in appetite, change in bowel movements. Patient denies rashes, musculoskeletal complaints. No other specific complaints in a complete review of systems.    well-developed well-nourished male in no acute distress. HEENT exam atraumatic, normocephalic, neck supple without jugular venous distention. Chest clear to auscultation cardiac exam S1-S2 are regular. Abdominal exam overweight with bowel sounds, soft and nontender. Extremities no edema. Neurologic exam is alert with a normal gait.

## 2013-02-18 ENCOUNTER — Telehealth: Payer: Self-pay | Admitting: Internal Medicine

## 2013-02-18 MED ORDER — SIMVASTATIN 20 MG PO TABS: 20.0000 mg | ORAL_TABLET | Freq: Every day | ORAL | Status: DC

## 2013-02-18 NOTE — Telephone Encounter (Signed)
Pt saw Dr Cato Mulligan last week and has not had his RX for simvastatin (ZOCOR) 20 MG tablet filled yet.   90 day 1 x day Pharm:   [employer]

## 2013-02-18 NOTE — Telephone Encounter (Signed)
rx sent in electronically 

## 2013-03-03 ENCOUNTER — Other Ambulatory Visit (INDEPENDENT_AMBULATORY_CARE_PROVIDER_SITE_OTHER): Payer: Private Health Insurance - Indemnity

## 2013-03-03 DIAGNOSIS — E785 Hyperlipidemia, unspecified: Secondary | ICD-10-CM

## 2013-03-03 LAB — BASIC METABOLIC PANEL
BUN: 14 mg/dL (ref 6–23)
CO2: 28 mEq/L (ref 19–32)
Calcium: 8.9 mg/dL (ref 8.4–10.5)
Chloride: 97 mEq/L (ref 96–112)
Creatinine, Ser: 1.2 mg/dL (ref 0.4–1.5)
GFR: 64.05 mL/min (ref >60.00)
Glucose, Bld: 98 mg/dL (ref 70–99)
Potassium: 3.8 mEq/L (ref 3.5–5.1)
Sodium: 131 mEq/L — ABNORMAL LOW (ref 135–145)

## 2013-08-25 ENCOUNTER — Telehealth: Payer: Self-pay | Admitting: Internal Medicine

## 2013-08-25 MED ORDER — CITALOPRAM HYDROBROMIDE 40 MG PO TABS: 40.0000 mg | ORAL_TABLET | Freq: Every day | ORAL | Status: DC

## 2013-08-25 NOTE — Telephone Encounter (Signed)
90 day supply to sent in electronically but pt needs an appt

## 2013-08-25 NOTE — Telephone Encounter (Signed)
Pt is requesting a refill of citalopram (CELEXA) 40 MG tablet to his [place].  Please advise. Thanks

## 2013-08-26 NOTE — Telephone Encounter (Signed)
Cpx/med fu appointment made for 09/25/13 8am

## 2013-09-25 ENCOUNTER — Encounter: Payer: Private Health Insurance - Indemnity | Admitting: Internal Medicine

## 2013-09-27 NOTE — Progress Notes (Signed)
cpx  Seeing dr. Gaynelle Arabian for urinary obstruction  Past Medical History  Diagnosis Date  . IBS (irritable bowel syndrome)     ? possible  . Chronic lower back pain   . Herniated disc     L1    History   Social History  . Marital Status: Married    Spouse Name: N/A    Number of Children: N/A  . Years of Education: N/A   Occupational History  . Not on file.   Social History Main Topics  . Smoking status: Never Smoker   . Smokeless tobacco: Not on file  . Alcohol Use: Not on file  . Drug Use: Not on file  . Sexual Activity: Not on file   Other Topics Concern  . Not on file   Social History Narrative  . No narrative on file    Past Surgical History  Procedure Laterality Date  . Pilonidal cyst excision  58 yrs old    Family History  Problem Relation Age of Onset  . Cancer Mother     Breast    Allergies  Allergen Reactions  . Codeine     REACTION: nausea vomiting    Current Outpatient Prescriptions on File Prior to Visit  Medication Sig Dispense Refill  . citalopram (CELEXA) 40 MG tablet Take 1 tablet (40 mg total) by mouth daily.  90 tablet  0  . simvastatin (ZOCOR) 20 MG tablet Take 1 tablet (20 mg total) by mouth at bedtime.  90 tablet  3   No current facility-administered medications on file prior to visit.     patient denies chest pain, shortness of breath, orthopnea. Denies lower extremity edema, abdominal pain, change in appetite, change in bowel movements. Patient denies rashes, musculoskeletal complaints. No other specific complaints in a complete review of systems.   Reviewed vitals Well-developed male in no acute distress. HEENT exam atraumatic, normocephalic, extraocular muscles are intact. Conjunctivae are pink without exudate. Neck is supple without lymphadenopathy, thyromegaly, jugular venous distention. Chest is clear to auscultation without increased work of breathing. Cardiac exam S1-S2 are regular. The PMI is normal. No significant  murmurs or gallops. Abdominal exam active bowel sounds, soft, nontender. No abdominal bruits. Extremities no clubbing cyanosis or edema. Peripheral pulses are normal without bruits. Neurologic exam alert and oriented without any motor or sensory deficits. Rectal exam normal tone prostate normal size without masses or asymmetry.  cpx- health maint utd

## 2013-09-28 ENCOUNTER — Ambulatory Visit (INDEPENDENT_AMBULATORY_CARE_PROVIDER_SITE_OTHER): Payer: Private Health Insurance - Indemnity | Admitting: Internal Medicine

## 2013-09-28 ENCOUNTER — Encounter: Payer: Self-pay | Admitting: Internal Medicine

## 2013-09-28 VITALS — BP 112/70 | HR 64 | Temp 98.3°F | Ht 74.0 in | Wt 192.0 lb

## 2013-09-28 DIAGNOSIS — Z0000 Encounter for general adult medical examination without abnormal findings: Secondary | ICD-10-CM

## 2013-09-28 LAB — CBC WITH DIFFERENTIAL/PLATELET
Basophils Absolute: 0 10*3/uL (ref 0.0–0.1)
Basophils Relative: 0.6 % (ref 0.0–3.0)
Eosinophils Absolute: 0.4 10*3/uL (ref 0.0–0.7)
Eosinophils Relative: 8.4 % — ABNORMAL HIGH (ref 0.0–5.0)
HCT: 42.3 % (ref 39.0–52.0)
Hemoglobin: 14 g/dL (ref 13.0–17.0)
Lymphocytes Relative: 26.5 % (ref 12.0–46.0)
Lymphs Abs: 1.4 10*3/uL (ref 0.7–4.0)
MCHC: 33.2 g/dL (ref 30.0–36.0)
MCV: 90.6 fl (ref 78.0–100.0)
Monocytes Absolute: 0.4 10*3/uL (ref 0.1–1.0)
Monocytes Relative: 7.9 % (ref 3.0–12.0)
Neutro Abs: 2.9 10*3/uL (ref 1.4–7.7)
Neutrophils Relative %: 56.6 % (ref 43.0–77.0)
Platelets: 197 10*3/uL (ref 150.0–400.0)
RBC: 4.67 Mil/uL (ref 4.22–5.81)
RDW: 13.7 % (ref 11.5–14.6)
WBC: 5.2 10*3/uL (ref 4.5–10.5)

## 2013-09-28 LAB — BASIC METABOLIC PANEL
BUN: 20 mg/dL (ref 6–23)
CO2: 32 mEq/L (ref 19–32)
Calcium: 9.5 mg/dL (ref 8.4–10.5)
Chloride: 101 mEq/L (ref 96–112)
Creatinine, Ser: 1.1 mg/dL (ref 0.4–1.5)
GFR: 70.5 mL/min (ref >60.00)
Glucose, Bld: 71 mg/dL (ref 70–99)
Potassium: 4.4 mEq/L (ref 3.5–5.1)
Sodium: 138 mEq/L (ref 135–145)

## 2013-09-28 LAB — POCT URINALYSIS DIPSTICK
Bilirubin, UA: NEGATIVE
Blood, UA: NEGATIVE
Glucose, UA: NEGATIVE
Ketones, UA: NEGATIVE
Leukocytes, UA: NEGATIVE
Nitrite, UA: NEGATIVE
Protein, UA: NEGATIVE
Spec Grav, UA: 1.015
Urobilinogen, UA: 0.2
pH, UA: 6.5

## 2013-09-28 LAB — HEPATIC FUNCTION PANEL
ALT: 20 U/L (ref 0–53)
AST: 23 U/L (ref 0–37)
Albumin: 4.3 g/dL (ref 3.5–5.2)
Alkaline Phosphatase: 37 U/L — ABNORMAL LOW (ref 39–117)
Bilirubin, Direct: 0.1 mg/dL (ref 0.0–0.3)
Total Bilirubin: 0.7 mg/dL (ref 0.3–1.2)
Total Protein: 6.9 g/dL (ref 6.0–8.3)

## 2013-09-28 LAB — LIPID PANEL
Cholesterol: 172 mg/dL (ref 0–200)
HDL: 75.9 mg/dL (ref >39.00)
LDL Cholesterol: 82 mg/dL (ref 0–99)
Total CHOL/HDL Ratio: 2
Triglycerides: 72 mg/dL (ref 0.0–149.0)
VLDL: 14.4 mg/dL (ref 0.0–40.0)

## 2013-09-28 LAB — TSH: TSH: 1.15 u[IU]/mL (ref 0.35–5.50)

## 2013-09-28 LAB — PSA: PSA: 0.88 ng/mL (ref 0.10–4.00)

## 2013-09-28 NOTE — Progress Notes (Signed)
Pre visit review using our clinic review tool, if applicable. No additional management support is needed unless otherwise documented below in the visit note. 

## 2013-10-19 ENCOUNTER — Telehealth: Payer: Self-pay | Admitting: Family Medicine

## 2013-10-19 MED ORDER — CITALOPRAM HYDROBROMIDE 40 MG PO TABS: 40.0000 mg | ORAL_TABLET | Freq: Every day | ORAL | Status: DC

## 2013-10-19 NOTE — Telephone Encounter (Signed)
Per Dr. Leanne Chang, okay to fill Citalopram and I did send e-scribe to [facility].

## 2014-01-20 ENCOUNTER — Other Ambulatory Visit: Payer: Self-pay | Admitting: Internal Medicine

## 2014-04-20 ENCOUNTER — Other Ambulatory Visit: Payer: Self-pay | Admitting: Family Medicine

## 2014-07-29 ENCOUNTER — Ambulatory Visit (INDEPENDENT_AMBULATORY_CARE_PROVIDER_SITE_OTHER): Payer: Private Health Insurance - Indemnity | Admitting: Family Medicine

## 2014-07-29 ENCOUNTER — Encounter: Payer: Self-pay | Admitting: Family Medicine

## 2014-07-29 VITALS — BP 102/60 | Temp 98.3°F | Wt 196.0 lb

## 2014-07-29 DIAGNOSIS — R350 Frequency of micturition: Secondary | ICD-10-CM

## 2014-07-29 DIAGNOSIS — F4310 Post-traumatic stress disorder, unspecified: Secondary | ICD-10-CM

## 2014-07-29 MED ORDER — CITALOPRAM HYDROBROMIDE 40 MG PO TABS: 40.0000 mg | ORAL_TABLET | Freq: Every day | ORAL | Status: DC

## 2014-07-29 NOTE — Assessment & Plan Note (Signed)
Polyuria and difficulty controlling. Urology workup in the past. i advised patient to seek urology care again at this point. He had questions about cialis which I am not sure would benefit him.

## 2014-07-29 NOTE — Assessment & Plan Note (Signed)
Symptoms have been well controlled on citalopram 40mg . Patient has not had to see his counselor over last year on this regimen. Trialed zoloft, prozac, citalopram 20mg  with poor control in the past. I told patient I would be happy to listen to the circumstances if he ever wishes to discuss in future but this was not a requirement but if worsening symptoms, would want to get him back to his counselor who he has discussed this with in past.

## 2014-07-29 NOTE — Patient Instructions (Addendum)
Ask them to change may appointment to annual physical and come in for bloodwork a week before. Standard labs fasting.   Refilled citalopram-glad you are doing well.
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
## 2014-09-28 ENCOUNTER — Ambulatory Visit: Payer: Private Health Insurance - Indemnity | Admitting: Family Medicine

## 2014-12-10 ENCOUNTER — Other Ambulatory Visit: Payer: Private Health Insurance - Indemnity

## 2014-12-14 ENCOUNTER — Encounter: Payer: Self-pay | Admitting: Family Medicine

## 2014-12-14 ENCOUNTER — Other Ambulatory Visit (INDEPENDENT_AMBULATORY_CARE_PROVIDER_SITE_OTHER): Payer: Managed Care, Other (non HMO)

## 2014-12-14 DIAGNOSIS — Z0000 Encounter for general adult medical examination without abnormal findings: Secondary | ICD-10-CM

## 2014-12-14 DIAGNOSIS — R972 Elevated prostate specific antigen [PSA]: Secondary | ICD-10-CM | POA: Insufficient documentation

## 2014-12-14 LAB — CBC WITH DIFFERENTIAL/PLATELET
Basophils Absolute: 0 10*3/uL (ref 0.0–0.1)
Basophils Relative: 0.4 % (ref 0.0–3.0)
Eosinophils Absolute: 0.4 10*3/uL (ref 0.0–0.7)
Eosinophils Relative: 6 % — ABNORMAL HIGH (ref 0.0–5.0)
HCT: 43.6 % (ref 39.0–52.0)
Hemoglobin: 14.6 g/dL (ref 13.0–17.0)
Lymphocytes Relative: 26.3 % (ref 12.0–46.0)
Lymphs Abs: 1.7 10*3/uL (ref 0.7–4.0)
MCHC: 33.5 g/dL (ref 30.0–36.0)
MCV: 89.3 fl (ref 78.0–100.0)
Monocytes Absolute: 0.5 10*3/uL (ref 0.1–1.0)
Monocytes Relative: 7.5 % (ref 3.0–12.0)
Neutro Abs: 4 10*3/uL (ref 1.4–7.7)
Neutrophils Relative %: 59.8 % (ref 43.0–77.0)
Platelets: 223 10*3/uL (ref 150.0–400.0)
RBC: 4.88 Mil/uL (ref 4.22–5.81)
RDW: 13.7 % (ref 11.5–15.5)
WBC: 6.6 10*3/uL (ref 4.0–10.5)

## 2014-12-14 LAB — POCT URINALYSIS DIPSTICK
Bilirubin, UA: NEGATIVE
Blood, UA: NEGATIVE
Glucose, UA: NEGATIVE
Ketones, UA: NEGATIVE
Leukocytes, UA: NEGATIVE
Nitrite, UA: NEGATIVE
Spec Grav, UA: 1.015
Urobilinogen, UA: 0.2
pH, UA: 8.5

## 2014-12-14 LAB — LIPID PANEL
Cholesterol: 173 mg/dL (ref 0–200)
HDL: 69.7 mg/dL (ref >39.00)
LDL Cholesterol: 89 mg/dL (ref 0–99)
NonHDL: 103.3
Total CHOL/HDL Ratio: 2
Triglycerides: 72 mg/dL (ref 0.0–149.0)
VLDL: 14.4 mg/dL (ref 0.0–40.0)

## 2014-12-14 LAB — COMPREHENSIVE METABOLIC PANEL
ALT: 13 U/L (ref 0–53)
AST: 19 U/L (ref 0–37)
Albumin: 4.2 g/dL (ref 3.5–5.2)
Alkaline Phosphatase: 36 U/L — ABNORMAL LOW (ref 39–117)
BUN: 18 mg/dL (ref 6–23)
CO2: 30 mEq/L (ref 19–32)
Calcium: 9.3 mg/dL (ref 8.4–10.5)
Chloride: 101 mEq/L (ref 96–112)
Creatinine, Ser: 1.12 mg/dL (ref 0.40–1.50)
GFR: 70.94 mL/min (ref >60.00)
Glucose, Bld: 90 mg/dL (ref 70–99)
Potassium: 4.9 mEq/L (ref 3.5–5.1)
Sodium: 137 mEq/L (ref 135–145)
Total Bilirubin: 0.5 mg/dL (ref 0.2–1.2)
Total Protein: 6.9 g/dL (ref 6.0–8.3)

## 2014-12-14 LAB — TSH: TSH: 1.52 u[IU]/mL (ref 0.35–4.50)

## 2014-12-14 LAB — PSA: PSA: 4.8 ng/mL — ABNORMAL HIGH (ref 0.10–4.00)

## 2014-12-17 ENCOUNTER — Encounter: Payer: Self-pay | Admitting: Family Medicine

## 2014-12-17 ENCOUNTER — Encounter: Payer: Private Health Insurance - Indemnity | Admitting: Family Medicine

## 2014-12-17 ENCOUNTER — Ambulatory Visit (INDEPENDENT_AMBULATORY_CARE_PROVIDER_SITE_OTHER): Payer: Managed Care, Other (non HMO) | Admitting: Family Medicine

## 2014-12-17 VITALS — BP 110/80 | HR 58 | Temp 98.2°F | Ht 73.0 in | Wt 195.0 lb

## 2014-12-17 DIAGNOSIS — Z0000 Encounter for general adult medical examination without abnormal findings: Secondary | ICD-10-CM

## 2014-12-17 DIAGNOSIS — Z7251 High risk heterosexual behavior: Secondary | ICD-10-CM | POA: Diagnosis not present

## 2014-12-17 DIAGNOSIS — K589 Irritable bowel syndrome without diarrhea: Secondary | ICD-10-CM | POA: Diagnosis not present

## 2014-12-17 DIAGNOSIS — F4310 Post-traumatic stress disorder, unspecified: Secondary | ICD-10-CM

## 2014-12-17 DIAGNOSIS — R972 Elevated prostate specific antigen [PSA]: Secondary | ICD-10-CM

## 2014-12-17 DIAGNOSIS — E785 Hyperlipidemia, unspecified: Secondary | ICD-10-CM

## 2014-12-17 LAB — PSA: PSA: 8.52 ng/mL — ABNORMAL HIGH (ref 0.10–4.00)

## 2014-12-17 MED ORDER — SIMVASTATIN 20 MG PO TABS: 20.0000 mg | ORAL_TABLET | Freq: Every day | ORAL | Status: DC

## 2014-12-17 MED ORDER — DICYCLOMINE HCL 20 MG PO TABS: 20.0000 mg | ORAL_TABLET | Freq: Four times a day (QID) | ORAL | Status: DC

## 2014-12-17 MED ORDER — CITALOPRAM HYDROBROMIDE 40 MG PO TABS: 40.0000 mg | ORAL_TABLET | Freq: Every day | ORAL | Status: DC

## 2014-12-17 NOTE — Assessment & Plan Note (Signed)
Controlled, continue simvastatin

## 2014-12-17 NOTE — Progress Notes (Signed)
Edward Reddish, MD Phone: [phone]  Subjective:  Patient presents today for their annual physical. Chief complaint-noted.   Elevated PSA detected on labs. No dysuria. Does have chronic urinary incontinence followed by Dr. Gaynelle Arabian in past. Wears diapers, no clear cause. Father with prostate cancer around 53. No rectal pain, bone pain.   Exercising regularly, eating reasonably well  Has for years had intermittent abdominal cramping. Previously on bentyl and requests refill as was helpful in past even with just prn use.   ROS- full  review of systems was completed and negative except for as noted above  The following were reviewed and entered/updated in epic: Past Medical History  Diagnosis Date  . IBS (irritable bowel syndrome)     ? possible  . Chronic lower back pain   . Herniated disc     L1   Patient Active Problem List   Diagnosis Date Noted  . Elevated PSA 12/14/2014    Priority: High  . Urinary frequency 11/28/2011    Priority: Medium  . Hyperlipidemia 03/21/2011    Priority: Medium  . PTSD (post-traumatic stress disorder) 09/06/2010    Priority: Medium  . IBS (irritable bowel syndrome)     Priority: Low   Past Surgical History  Procedure Laterality Date  . Pilonidal cyst excision  59 yrs old    Family History  Problem Relation Age of Onset  . Cancer Mother     Breast  . Prostate cancer Father     Medications- reviewed and updated Current Outpatient Prescriptions  Medication Sig Dispense Refill  . citalopram (CELEXA) 40 MG tablet Take 1 tablet (40 mg total) by mouth daily. 90 tablet 3  . simvastatin (ZOCOR) 20 MG tablet TAKE 1 TABLET AT BEDTIME 90 tablet 3   Allergies-reviewed and updated Allergies  Allergen Reactions  . Codeine     REACTION: nausea vomiting    History   Social History  . Marital Status: Married    Spouse Name: N/A  . Number of Children: N/A  . Years of Education: N/A   Social History Main Topics  . Smoking status:  Never Smoker   . Smokeless tobacco: Not on file  . Alcohol Use: Not on file  . Drug Use: Not on file  . Sexual Activity: Not on file   Other Topics Concern  . None   Social History Narrative    ROS--See HPI   Objective: BP 110/80 mmHg  Pulse 58  Temp(Src) 98.2 F (36.8 C) (Oral)  Ht 6\' 1"  (1.854 m)  Wt 195 lb (88.451 kg)  BMI 25.73 kg/m2 Gen: NAD, resting comfortably HEENT: Mucous membranes are moist. Oropharynx normal. TM normal Neck: no thyromegaly CV: RRR no murmurs rubs or gallops Lungs: CTAB no crackles, wheeze, rhonchi Abdomen: soft/nontender/nondistended/normal bowel sounds. No rebound or guarding.  Rectal: the prostate is enlarged diffusely without focal asymmetry but nontender & without nodules but the prostate is somewhat boggy  Ext: no edema Skin: warm, dry Neuro: grossly normal, moves all extremities, PERRLA   Assessment/Plan:  59 y.o. male presenting for annual physical.  Health Maintenance counseling: 1. Anticipatory guidance: Patient counseled regarding regular dental exams, wearing seatbelts, wearing sunscreen 2. Risk factor reduction:  Advised patient of need for regular exercise and diet rich and fruits and vegetables to reduce risk of heart attack and stroke.  3. Immunizations/screenings/ancillary studies Health Maintenance Due  Topic Date Due  . HIV Screening - test today 05/06/1969   Elevated PSA Asymptomatic. Boggy enlarged prostate without focal pain  and asymptomatic- possible prostatitis but with PSA elevation, refer to urology for their opinion based on AUA choosing wisely campaign advisement to not treat with antibiotics when patient asymptomatic with elevated PSA. Already plugged in with Dr. Gaynelle Arabian so hopeful can get patient in within a week if possible. A lot of anxiety obviously and patient with family history prostate cancer. Repeat PSA today even higher >8 up from >4 up from 0.8 a year ago.    IBS (irritable bowel syndrome) Treated  in past with bentyl. Willing to resume for intermittent cramping, bloating, diarrhea as up to date on colonoscopy   Hyperlipidemia Controlled, continue simvastatin   follow up depends on urology plans, but likely at least back for CPE  Orders Placed This Encounter  Procedures  . PSA  . HIV antibody    [facility]  . Ambulatory referral to Urology    Referral Priority:  Routine    Referral Type:  Consultation    Referral Reason:  Specialty Services Required    Requested Specialty:  Urology    Number of Visits Requested:  1    Meds ordered this encounter  Medications  . simvastatin (ZOCOR) 20 MG tablet    Sig: Take 1 tablet (20 mg total) by mouth at bedtime.    Dispense:  90 tablet    Refill:  3  . citalopram (CELEXA) 40 MG tablet    Sig: Take 1 tablet (40 mg total) by mouth daily.    Dispense:  90 tablet    Refill:  3  . dicyclomine (BENTYL) 20 MG tablet    Sig: Take 1 tablet (20 mg total) by mouth every 6 (six) hours. As needed    Dispense:  30 tablet    Refill:  1

## 2014-12-17 NOTE — Patient Instructions (Addendum)
We will call you within a week about your referral to urology. If you do not hear within 2 weeks, give [place] a call.   Check another PSA today to make sure not a false elevation.   Sent in 2 refills and trial bentyl  59 y.o. male presenting for annual physical.  Health Maintenance counseling: 1. Anticipatory guidance: Patient counseled regarding regular dental exams, wearing seatbelts, wearing sunscreen 2. Risk factor reduction:  Advised patient of need for regular exercise and diet rich and fruits and vegetables to reduce risk of heart attack and stroke.  3. Immunizations/screenings/ancillary studies Health Maintenance Due  Topic Date Due  . HIV Screening - future visit or given blood? 05/06/1969

## 2014-12-17 NOTE — Progress Notes (Signed)
Pre visit review using our clinic review tool, if applicable. No additional management support is needed unless otherwise documented below in the visit note. 

## 2014-12-17 NOTE — Assessment & Plan Note (Signed)
Asymptomatic. Boggy enlarged prostate without focal pain and asymptomatic- possible prostatitis but with PSA elevation, refer to urology for their opinion based on AUA choosing wisely campaign advisement to not treat with antibiotics when patient asymptomatic with elevated PSA. Already plugged in with Dr. Gaynelle Arabian so hopeful can get patient in within a week if possible. A lot of anxiety obviously and patient with family history prostate cancer. Repeat PSA today even higher >8 up from >4 up from 0.8 a year ago.

## 2014-12-17 NOTE — Assessment & Plan Note (Signed)
Treated in past with bentyl. Willing to resume for intermittent cramping, bloating, diarrhea as up to date on colonoscopy

## 2014-12-18 LAB — HIV ANTIBODY (ROUTINE TESTING W REFLEX): HIV 1&2 Ab, 4th Generation: NONREACTIVE

## 2014-12-21 ENCOUNTER — Telehealth: Payer: Self-pay | Admitting: Internal Medicine

## 2014-12-21 NOTE — Telephone Encounter (Signed)
Per office procedure the two doctors at [facility] Dr. Gladis Riffle and Dr. Gaynelle Arabian will have to discuss the patient switching over to Dr. Derald Macleod.

## 2014-12-21 NOTE — Telephone Encounter (Signed)
Pt call to say that he contacted [facility] because he wanted to see Dr Daria Pastures. He said he was told by the office that he needed to contact Dr Yong Channel concerning this. I spoke with Neoma Laming and she said that he could make a appt with anyone at the office. I told the pt what Neoma Laming said that he could call back over there and request Dr Daria Pastures. He did not want to saying that they probably would send him back to [place]. Pt said just have Dr Yong Channel call me.

## 2014-12-21 NOTE — Telephone Encounter (Signed)
Pt.notified

## 2014-12-22 ENCOUNTER — Encounter: Payer: Self-pay | Admitting: Internal Medicine

## 2014-12-31 LAB — PSA: PSA: 2.31

## 2015-01-03 ENCOUNTER — Other Ambulatory Visit (HOSPITAL_COMMUNITY): Payer: Self-pay | Admitting: Urology

## 2015-01-03 DIAGNOSIS — R972 Elevated prostate specific antigen [PSA]: Secondary | ICD-10-CM

## 2015-01-25 ENCOUNTER — Ambulatory Visit (HOSPITAL_COMMUNITY)
Admission: RE | Admit: 2015-01-25 | Discharge: 2015-01-25 | Disposition: A | Discharge status: Home / Self Care | Payer: Managed Care, Other (non HMO) | Source: Ambulatory Visit | Attending: Urology | Admitting: Urology

## 2015-01-25 DIAGNOSIS — R972 Elevated prostate specific antigen [PSA]: Secondary | ICD-10-CM | POA: Diagnosis not present

## 2015-01-25 DIAGNOSIS — N400 Enlarged prostate without lower urinary tract symptoms: Secondary | ICD-10-CM | POA: Diagnosis not present

## 2015-01-25 DIAGNOSIS — Z8042 Family history of malignant neoplasm of prostate: Secondary | ICD-10-CM | POA: Diagnosis not present

## 2015-01-25 MED ORDER — GADOBENATE DIMEGLUMINE 529 MG/ML IV SOLN
20.0000 mL | Freq: Once | INTRAVENOUS | Status: AC | PRN
  Administered 2015-01-25: 18 mL via INTRAVENOUS

## 2015-01-31 ENCOUNTER — Telehealth: Payer: Self-pay | Admitting: Family Medicine

## 2015-01-31 NOTE — Telephone Encounter (Signed)
Pt had mri and would like results. Pt is aware md out of office today

## 2015-01-31 NOTE — Telephone Encounter (Signed)
MRI was ordered by urology. It would be ideal if he could discuss this with Dr. Gaynelle Arabian so they can decide on next steps.

## 2015-02-01 NOTE — Telephone Encounter (Signed)
Called pt and pt states he is following up with Dr. Gaynelle Arabian tomorrow.

## 2015-02-04 ENCOUNTER — Encounter: Payer: Self-pay | Admitting: Family Medicine

## 2015-02-11 HISTORY — PX: PROSTATE BIOPSY: SHX241

## 2015-03-03 ENCOUNTER — Ambulatory Visit: Payer: Managed Care, Other (non HMO)

## 2015-03-03 ENCOUNTER — Ambulatory Visit
Admission: RE | Admit: 2015-03-03 | Payer: Managed Care, Other (non HMO) | Source: Ambulatory Visit | Admitting: Radiation Oncology

## 2015-04-25 ENCOUNTER — Encounter: Payer: Self-pay | Admitting: Radiation Oncology

## 2015-04-25 NOTE — Progress Notes (Addendum)
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 Raynelle Bring with a "significant" rise in his PSA from 0.8 in March 2015 to 4.8 in May of 2016.  Repeat Biopsy on 12/31/14 revealed a PSA of 2.3.  MRI of the pelvis revealed a 11 mm lesion in the right posterolateral mid region of the prostate and a 50mm "indeterminate lesion in the left mid region  Past/Anticipated interventions by urology, if any: Biopsy of the Prostate  Past/Anticipated interventions by medical oncology, if any: Unknown  Weight changes, if any: NO Bowel/Bladder complaints, if any: History of urinary frequency every "45 minutes during the daytime, urgency and nocturia x 2-3 and urge incontinence,  Nausea/Vomiting, if any:NO  Pain issues, if any:  NO  SAFETY ISSUES:  Prior radiation? No  Pacemaker/ICD?   Possible current pregnancy? N/A  Is the patient on methotrexate? No  Current Complaints / other details: Married,  1 son 1 daughter, drinks alcohol 1 per day,never smoker , no illicit  drug use  I-PSS score-14  Father prostate ca dx 65,living at age 53, Mother Breast cancer, surgery mastectomy and chemotherapy, dx age 58, living 42  Allergies:Codeine BP 124/66 mmHg  Pulse 66  Temp(Src) 98.1 F (36.7 C) (Oral)  Resp 20  Ht 6\' 2"  (1.88 m)  Wt 195 lb 12.8 oz (88.814 kg)  BMI 25.13 kg/m2  Wt Readings from Last 3 Encounters:  04/26/15 195 lb 12.8 oz (88.814 kg)  12/17/14 195 lb (88.451 kg)  07/29/14 196 lb (88.905 kg)

## 2015-04-26 ENCOUNTER — Ambulatory Visit
Admission: RE | Admit: 2015-04-26 | Discharge: 2015-04-26 | Disposition: A | Discharge status: Home / Self Care | Payer: Managed Care, Other (non HMO) | Source: Ambulatory Visit | Attending: Radiation Oncology | Admitting: Radiation Oncology

## 2015-04-26 ENCOUNTER — Encounter: Payer: Self-pay | Admitting: Radiation Oncology

## 2015-04-26 VITALS — BP 124/66 | HR 66 | Temp 98.1°F | Resp 20 | Ht 74.0 in | Wt 195.8 lb

## 2015-04-26 DIAGNOSIS — C61 Malignant neoplasm of prostate: Secondary | ICD-10-CM | POA: Insufficient documentation

## 2015-04-26 DIAGNOSIS — Z510 Encounter for antineoplastic radiation therapy: Secondary | ICD-10-CM | POA: Insufficient documentation

## 2015-04-26 HISTORY — DX: Malignant neoplasm of prostate: C61

## 2015-04-26 HISTORY — DX: Allergy, unspecified, initial encounter: T78.40XA

## 2015-04-26 HISTORY — DX: Pure hypercholesterolemia, unspecified: E78.00

## 2015-04-26 NOTE — Progress Notes (Signed)
Please see the Nurse Progress Note in the MD Initial Consult Encounter for this patient. 

## 2015-04-26 NOTE — Addendum Note (Signed)
Encounter addended by: Doreen Beam, RN on: 04/26/2015 12:18 PM<BR>     Documentation filed: Inpatient Patient Education

## 2015-04-26 NOTE — Progress Notes (Signed)
[facility] NEW PATIENT EVALUATION  Name: Edward Lee MRN: [id number]  Date:   04/26/2015           DOB: [birth year]  Status: outpatient   CC: [name], MD  Raynelle Bring, MD , Dr. 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 male who is seen today through the courtesy of Dr. 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  His PSA was 0.8 back in March 2015.  His PSA rose to 4.8 by May 2016.  A Dr. Yong Channel repeated his PSA and it rose further to 8.52.  The patient was seen back by Dr. Gaynelle Arabian who repeated his PSA on 12/31/2014, and it fell to 2.31.  A MRI scan of the prostate on 01/25/2015 showed a 1.1 cm focus of possible low-grade carcinoma versus prostatitis in the right posterior lateral mid gland.  There was an additional 0.7 cm focus in the contralateral left mid gland felt to be nonspecific.  Ultrasound-guided biopsies by Dr. Gaynelle Arabian on 02/11/2015 showed Gleason 6 (3+3) adenocarcinoma involving 5% of one core from the right apex.  This represented one of 15 biopsies.  His prostate volume was 18 mL.  He does have a history of lower urinary tract symptoms with frequency and urgency not felt to be obstructive.  However, he did fail anticholinergic therapy.  He does have erectile dysfunction for which he takes sildenafil with good success.  No rectal issues.  He does have a past history of "irritable bowel".  PREVIOUS RADIATION THERAPY: No   PAST MEDICAL HISTORY:  has a past medical history of IBS (irritable bowel syndrome); Chronic lower back pain; Herniated disc; Prostate cancer ([place]); Hypercholesteremia; and Allergy.     PAST SURGICAL HISTORY:  Past Surgical History  Procedure Laterality Date  . Pilonidal cyst excision  59 yrs  old  . Prostate biopsy  02/11/15  . Cataract extraction       FAMILY HISTORY: family history includes Breast cancer in his mother; Prostate cancer in his father.  His father was diagnosed with prostate cancer at age 82, underwent surgery and is disease-free at age 38.  He does have some dementia and resides in a memory care center.  His mother was diagnosed with breast cancer 48 and is alive and well at 67.   SOCIAL HISTORY:  reports that he has never smoked. He does not have any smokeless tobacco history on file. He reports that he drinks alcohol.  Married, 2 children.  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   ALLERGIES: Codeine   MEDICATIONS:  Current Outpatient Prescriptions  Medication Sig Dispense Refill  . citalopram (CELEXA) 40 MG tablet Take 1 tablet (40 mg total) by mouth daily. 90 tablet 3  . dicyclomine (BENTYL) 20 MG tablet Take 1 tablet (20 mg total) by mouth every 6 (six) hours. As needed 30 tablet 1  . meloxicam (MOBIC) 7.5 MG tablet Take 7.5 mg by mouth daily.    . sildenafil (REVATIO) 20 MG tablet Take 20 mg by mouth as needed (2 -5 tabs as needed).    . simvastatin (ZOCOR) 20 MG tablet Take 1 tablet (20 mg total) by mouth at bedtime. 90 tablet 3   No current facility-administered medications for this  encounter.     REVIEW OF SYSTEMS:  Pertinent items are noted in HPI.    PHYSICAL EXAM:  height is 6\' 2"  (1.88 m) and weight is 195 lb 12.8 oz (88.814 kg). His oral temperature is 98.1 F (36.7 C). His blood pressure is 124/66 and his pulse is 66. His respiration is 20.   He is not examined today.   LABORATORY DATA:  Lab Results  Component Value Date   WBC 6.6 12/14/2014   HGB 14.6 12/14/2014   HCT 43.6 12/14/2014   MCV 89.3 12/14/2014   PLT 223.0 12/14/2014   Lab Results  Component Value Date   NA 137 12/14/2014   K 4.9 12/14/2014   CL 101 12/14/2014   CO2 30 12/14/2014   Lab Results  Component Value  Date   ALT 13 12/14/2014   AST 19 12/14/2014   ALKPHOS 36* 12/14/2014   BILITOT 0.5 12/14/2014   PSA 2.31 from 12/31/2014   IMPRESSION: Clinical stage T1c favorable risk adenocarcinoma the prostate.  I explained to the patient that his prognosis is related to his stage, PSA level, and Gleason score.  These are all favorable.  Other prognostic factors include PSA doubling time and disease volume.  These are also favorable.  We discussed surgery versus active surveillance versus radiation therapy.  Radiation therapy options include seed implantation alone versus external beam/IMRT.  We discussed external beam/IMRT and seed implantation in great detail.  A recently published  non-inferiority hypofractionated RTOG trial published in [facility] this past July looked at 5-1/2 weeks of external beam compared to standard fractionation (8 weeks)  for favorable risk patients.  Median follow-up approaching 6 years showed noninferiority to hypofractionated treatment in terms of disease-free survival ,but with a slight increase in late GI/genitourinary adverse events with hypofractionated treatment.  We discussed in detail the potential acute and late complications of both seed implantation and external beam/IMRT.  He knows that with his underlying bladder irritability his symptoms may worsen with either surgery or radiation therapy.  He chooses active surveillance at this time and expects to have a repeat biopsy early next year.   PLAN: As discussed above.  I spent 60 minutes face to face with the patient and more than 50% of that time was spent in counseling and/or coordination of care.

## 2015-05-30 LAB — PSA: PSA: 0.98

## 2015-06-16 ENCOUNTER — Encounter: Payer: Self-pay | Admitting: Family Medicine

## 2016-01-03 ENCOUNTER — Encounter: Payer: Self-pay | Admitting: Gastroenterology

## 2016-01-07 ENCOUNTER — Other Ambulatory Visit: Payer: Self-pay | Admitting: Family Medicine

## 2016-01-20 ENCOUNTER — Telehealth: Payer: Self-pay | Admitting: Family Medicine

## 2016-01-20 ENCOUNTER — Encounter: Payer: Self-pay | Admitting: Family Medicine

## 2016-01-20 NOTE — Telephone Encounter (Signed)
Error/ltd ° °

## 2016-01-23 ENCOUNTER — Ambulatory Visit (INDEPENDENT_AMBULATORY_CARE_PROVIDER_SITE_OTHER): Payer: Managed Care, Other (non HMO) | Admitting: Family Medicine

## 2016-01-23 ENCOUNTER — Encounter: Payer: Self-pay | Admitting: Family Medicine

## 2016-01-23 VITALS — BP 112/88 | HR 64 | Temp 97.9°F | Ht 74.0 in | Wt 203.0 lb

## 2016-01-23 DIAGNOSIS — Z0000 Encounter for general adult medical examination without abnormal findings: Secondary | ICD-10-CM | POA: Diagnosis not present

## 2016-01-23 DIAGNOSIS — Z0001 Encounter for general adult medical examination with abnormal findings: Secondary | ICD-10-CM

## 2016-01-23 DIAGNOSIS — R5383 Other fatigue: Secondary | ICD-10-CM | POA: Diagnosis not present

## 2016-01-23 LAB — COMPREHENSIVE METABOLIC PANEL
ALT: 15 U/L (ref 0–53)
AST: 21 U/L (ref 0–37)
Albumin: 4.5 g/dL (ref 3.5–5.2)
Alkaline Phosphatase: 35 U/L — ABNORMAL LOW (ref 39–117)
BUN: 23 mg/dL (ref 6–23)
CO2: 30 mEq/L (ref 19–32)
Calcium: 9.8 mg/dL (ref 8.4–10.5)
Chloride: 101 mEq/L (ref 96–112)
Creatinine, Ser: 1.09 mg/dL (ref 0.40–1.50)
GFR: 72.92 mL/min (ref >60.00)
Glucose, Bld: 92 mg/dL (ref 70–99)
Potassium: 4.7 mEq/L (ref 3.5–5.1)
Sodium: 138 mEq/L (ref 135–145)
Total Bilirubin: 0.7 mg/dL (ref 0.2–1.2)
Total Protein: 7.2 g/dL (ref 6.0–8.3)

## 2016-01-23 LAB — CBC
HCT: 43.2 % (ref 39.0–52.0)
Hemoglobin: 14.4 g/dL (ref 13.0–17.0)
MCHC: 33.4 g/dL (ref 30.0–36.0)
MCV: 88.6 fl (ref 78.0–100.0)
Platelets: 198 10*3/uL (ref 150.0–400.0)
RBC: 4.88 Mil/uL (ref 4.22–5.81)
RDW: 13.4 % (ref 11.5–15.5)
WBC: 5.7 10*3/uL (ref 4.0–10.5)

## 2016-01-23 LAB — LIPID PANEL
Cholesterol: 175 mg/dL (ref 0–200)
HDL: 73.6 mg/dL (ref >39.00)
LDL Cholesterol: 86 mg/dL (ref 0–99)
NonHDL: 101.84
Total CHOL/HDL Ratio: 2
Triglycerides: 79 mg/dL (ref 0.0–149.0)
VLDL: 15.8 mg/dL (ref 0.0–40.0)

## 2016-01-23 MED ORDER — SIMVASTATIN 20 MG PO TABS: 20.0000 mg | ORAL_TABLET | Freq: Every day | ORAL | Status: DC

## 2016-01-23 MED ORDER — CITALOPRAM HYDROBROMIDE 40 MG PO TABS: 40.0000 mg | ORAL_TABLET | Freq: Every day | ORAL | Status: DC

## 2016-01-23 NOTE — Patient Instructions (Addendum)
Call your insurace about the shingles shot to see if it is covered or how much it would cost and where is cheaper (here or pharmacy).  If you want to receive here, call for nurse visit. I can send in a prescription to your pharmacy as well.   Labs before you go  Glad you are calling about colonoscopy  Target weight back around 195

## 2016-01-23 NOTE — Progress Notes (Signed)
Phone: [phone]  Subjective:  Patient presents today for their annual physical. Chief complaint-noted.   See problem oriented charting- ROS- full  review of systems was completed and negative except for: some low back pain and other arthritic pain. Some more fatigue than in past.   The following were reviewed and entered/updated in epic: Past Medical History  Diagnosis Date  . IBS (irritable bowel syndrome)     ? possible  . Chronic lower back pain   . Herniated disc     L1  . Prostate cancer ([place])   . Hypercholesteremia   . Allergy    Patient Active Problem List   Diagnosis Date Noted  . Malignant neoplasm of prostate ([place]) 04/26/2015    Priority: High  . Urinary frequency 11/28/2011    Priority: Medium  . Hyperlipidemia 03/21/2011    Priority: Medium  . PTSD (post-traumatic stress disorder) 09/06/2010    Priority: Medium  . IBS (irritable bowel syndrome)     Priority: Low   Past Surgical History  Procedure Laterality Date  . Pilonidal cyst excision  60 yrs old  . Prostate biopsy  02/11/15  . Cataract extraction      Family History  Problem Relation Age of Onset  . Breast cancer Mother     Breast  . Prostate cancer Father     Medications- reviewed and updated Current Outpatient Prescriptions  Medication Sig Dispense Refill  . citalopram (CELEXA) 40 MG tablet Take 1 tablet (40 mg total) by mouth daily. 90 tablet 3  . dicyclomine (BENTYL) 20 MG tablet Take 1 tablet (20 mg total) by mouth every 6 (six) hours. As needed 30 tablet 1  . meloxicam (MOBIC) 7.5 MG tablet Take 7.5 mg by mouth daily.    . sildenafil (REVATIO) 20 MG tablet Take 20 mg by mouth as needed (2 -5 tabs as needed).    . simvastatin (ZOCOR) 20 MG tablet Take 1 tablet (20 mg total) by mouth at bedtime. 90 tablet 3   No current facility-administered medications for this visit.    Allergies-reviewed and updated Allergies  Allergen Reactions  . Codeine     REACTION: nausea vomiting     Social History   Social History  . Marital Status: Married    Spouse Name: N/A  . Number of Children: 2  . Years of Education: N/A   Occupational History  . Buyer, retail    Social History Main Topics  . Smoking status: Never Smoker   . Smokeless tobacco: None  . Alcohol Use: 0.0 oz/week    0 Standard drinks or equivalent per week     Comment: 1 drink per day  . Drug Use: None  . Sexual Activity: Not Asked   Other Topics Concern  . None   Social History Narrative    Objective: BP 112/88 mmHg  Pulse 64  Temp(Src) 97.9 F (36.6 C) (Oral)  Ht 6\' 2"  (1.88 m)  Wt 203 lb (92.08 kg)  BMI 26.05 kg/m2  SpO2 95% Gen: NAD, resting comfortably HEENT: Mucous membranes are moist. Oropharynx normal Neck: no thyromegaly CV: RRR no murmurs rubs or gallops Lungs: CTAB no crackles, wheeze, rhonchi Abdomen: soft/nontender/nondistended/normal bowel sounds. No rebound or guarding.  Ext: no edema Skin: warm, dry Neuro: grossly normal, moves all extremities, PERRLA  Rectal deferred to urology  Assessment/Plan:  60 y.o. male presenting for annual physical.  Health Maintenance counseling: 1. Anticipatory guidance: Patient counseled regarding regular dental exams, eye exams, wearing seatbelts.  2. Risk  factor reduction:  Advised patient of need for regular exercise and diet rich and fruits and vegetables to reduce risk of heart attack and stroke. Exercise getting more difficult due to arthritis. Weight up 8 lbs- hard to see why based on dietary history. Advised try to target more 195.  3. Immunizations/screenings/ancillary studies Immunization History  Administered Date(s) Administered  . Td 01/14/1997, 01/20/2008   Health Maintenance Due  Topic Date Due  . ZOSTAVAX - will check with insurance 05/06/2014  . COLONOSCOPY - planning to call to schedule, just got letter 01/19/2016   4. Prostate cancer under active surveillance, PSAs stable- biopsy July 2016, Gleason  3+3=6  5. Colon cancer screening - see above   Status of chronic or acute concerns  Polyuria workup- having 2nd opinion with Dr. McDiarmid  Malignant neoplasm of prostate [facility])  Prostate cancer under active surveillance, PSAs stable- biopsy July 2016, Gleason 3+3=6  PTSD (post-traumatic stress disorder) Stable on celexa 40mg . No Si/HI  HLD- on simvastatin 20mg - update lipids today  Fasting- asks for testosterone level as has felt more fatigued lately. NO CP or SOB Orders Placed This Encounter  Procedures  . CBC    [place]  . Comprehensive metabolic panel    [place]    Order Specific Question:  Has the patient fasted?    Answer:  No  . Lipid panel    [place]    Order Specific Question:  Has the patient fasted?    Answer:  No  . Testosterone, Free, Total, SHBG    Meds ordered this encounter  Medications  . citalopram (CELEXA) 40 MG tablet    Sig: Take 1 tablet (40 mg total) by mouth daily.    Dispense:  90 tablet    Refill:  3  . simvastatin (ZOCOR) 20 MG tablet    Sig: Take 1 tablet (20 mg total) by mouth at bedtime.    Dispense:  90 tablet    Refill:  3    Return precautions advised.   [name], MD

## 2016-01-23 NOTE — Assessment & Plan Note (Signed)
Prostate cancer under active surveillance, PSAs stable- biopsy July 2016, Gleason 3+3=6

## 2016-01-23 NOTE — Assessment & Plan Note (Signed)
Stable on celexa 40mg . No Si/HI

## 2016-01-23 NOTE — Progress Notes (Signed)
Pre visit review using our clinic review tool, if applicable. No additional management support is needed unless otherwise documented below in the visit note. 

## 2016-01-26 ENCOUNTER — Encounter: Payer: Self-pay | Admitting: Gastroenterology

## 2016-01-27 ENCOUNTER — Telehealth: Payer: Self-pay

## 2016-01-27 NOTE — Telephone Encounter (Signed)
Patient called and patient verbalized understanding regarding testosterone level being normal

## 2016-03-22 ENCOUNTER — Ambulatory Visit (AMBULATORY_SURGERY_CENTER): Payer: Self-pay | Admitting: *Deleted

## 2016-03-22 VITALS — Ht 74.0 in | Wt 202.6 lb

## 2016-03-22 DIAGNOSIS — Z1211 Encounter for screening for malignant neoplasm of colon: Secondary | ICD-10-CM

## 2016-03-22 MED ORDER — NA SULFATE-K SULFATE-MG SULF 17.5-3.13-1.6 GM/177ML PO SOLN: 1.0000 | Freq: Once | ORAL | 0 refills | Status: DC

## 2016-03-22 NOTE — Progress Notes (Signed)
Denies allergies to eggs or soy products. Denies complications with sedation or anesthesia. Denies O2 use. Denies use of diet or weight loss medications.  Emmi instructions given for colonoscopy.  

## 2016-04-05 ENCOUNTER — Encounter: Payer: Managed Care, Other (non HMO) | Admitting: Gastroenterology

## 2016-04-26 ENCOUNTER — Encounter: Payer: Self-pay | Admitting: Internal Medicine

## 2016-05-09 ENCOUNTER — Ambulatory Visit (AMBULATORY_SURGERY_CENTER): Payer: Managed Care, Other (non HMO) | Admitting: Internal Medicine

## 2016-05-09 ENCOUNTER — Encounter: Payer: Self-pay | Admitting: Internal Medicine

## 2016-05-09 VITALS — BP 112/74 | HR 63 | Temp 96.6°F | Resp 10 | Ht 74.0 in | Wt 202.0 lb

## 2016-05-09 DIAGNOSIS — Z1212 Encounter for screening for malignant neoplasm of rectum: Secondary | ICD-10-CM

## 2016-05-09 DIAGNOSIS — Z1211 Encounter for screening for malignant neoplasm of colon: Secondary | ICD-10-CM

## 2016-05-09 MED ORDER — SODIUM CHLORIDE 0.9 % IV SOLN: 500.0000 mL | INTRAVENOUS | Status: DC

## 2016-05-09 NOTE — Progress Notes (Signed)
Report to PACU, RN, vss, BBS= Clear.  

## 2016-05-09 NOTE — Op Note (Signed)
[facility] Patient Name: Edward Lee Procedure Date: 05/09/2016 7:48 AM MRN: [id number] Endoscopist: Gatha Mayer , MD Age: 60 Referring MD:  Date of Birth: 03-06-1956 Gender: Male Account #: [id number] Procedure:                Colonoscopy Indications:              Screening for colorectal malignant neoplasm, Last                            colonoscopy: 2007 Medicines:                Propofol per Anesthesia, Monitored Anesthesia Care Procedure:                Pre-Anesthesia Assessment:                           - Prior to the procedure, a History and Physical                            was performed, and patient medications and                            allergies were reviewed. The patient's tolerance of                            previous anesthesia was also reviewed. The risks                            and benefits of the procedure and the sedation                            options and risks were discussed with the patient.                            All questions were answered, and informed consent                            was obtained. Prior Anticoagulants: The patient has                            taken no previous anticoagulant or antiplatelet                            agents. ASA Grade Assessment: II - A patient with                            mild systemic disease. After reviewing the risks                            and benefits, the patient was deemed in                            satisfactory condition to undergo the procedure.  After obtaining informed consent, the colonoscope                            was passed under direct vision. Throughout the                            procedure, the patient's blood pressure, pulse, and                            oxygen saturations were monitored continuously. The                            Model CF-HQ190L [id number]) scope was introduced                            through the anus  and advanced to the the cecum,                            identified by appendiceal orifice and ileocecal                            valve. The colonoscopy was performed without                            difficulty. The patient tolerated the procedure                            well. The quality of the bowel preparation was                            excellent. The bowel preparation used was Miralax.                            The ileocecal valve, appendiceal orifice, and                            rectum were photographed. Scope In: 7:52:29 AM Scope Out: 8:04:12 AM Scope Withdrawal Time: 0 hours 8 minutes 13 seconds  Total Procedure Duration: 0 hours 11 minutes 43 seconds  Findings:                 The perianal and digital rectal examinations were                            normal. Pertinent negatives include normal prostate                            (size, shape, and consistency).                           The colon (entire examined portion) appeared normal.                           No additional abnormalities were found on  retroflexion. Complications:            No immediate complications. Estimated Blood Loss:     Estimated blood loss: none. Impression:               - The entire examined colon is normal.                           - No specimens collected. Recommendation:           - Patient has a contact number available for                            emergencies. The signs and symptoms of potential                            delayed complications were discussed with the                            patient. Return to normal activities tomorrow.                            Written discharge instructions were provided to the                            patient.                           - Resume previous diet.                           - Continue present medications.                           - Repeat colonoscopy/ do other screening test in 10                             years for screening purposes. Gatha Mayer, MD 05/09/2016 8:08:54 AM This report has been signed electronically.

## 2016-05-09 NOTE — Patient Instructions (Addendum)
The colonoscopy was normal.  Next routine colonoscopy/screening test in 10 years - 2027.  I appreciate the opportunity to care for you. Gatha Mayer, MD, FACG  YOU HAD AN ENDOSCOPIC PROCEDURE TODAY AT [facility]:   Refer to the procedure report that was given to you for any specific questions about what was found during the examination.  If the procedure report does not answer your questions, please call your gastroenterologist to clarify.  If you requested that your care partner not be given the details of your procedure findings, then the procedure report has been included in a sealed envelope for you to review at your convenience later.  YOU SHOULD EXPECT: Some feelings of bloating in the abdomen. Passage of more gas than usual.  Walking can help get rid of the air that was put into your GI tract during the procedure and reduce the bloating. If you had a lower endoscopy (such as a colonoscopy or flexible sigmoidoscopy) you may notice spotting of blood in your stool or on the toilet paper. If you underwent a bowel prep for your procedure, you may not have a normal bowel movement for a few days.  Please Note:  You might notice some irritation and congestion in your nose or some drainage.  This is from the oxygen used during your procedure.  There is no need for concern and it should clear up in a day or so.  SYMPTOMS TO REPORT IMMEDIATELY:   Following lower endoscopy (colonoscopy or flexible sigmoidoscopy):  Excessive amounts of blood in the stool  Significant tenderness or worsening of abdominal pains  Swelling of the abdomen that is new, acute  Fever of 100F or higher   Following upper endoscopy (EGD)  Vomiting of blood or coffee ground material  New chest pain or pain under the shoulder blades  Painful or persistently difficult swallowing  New shortness of breath  Fever of 100F or higher  Black, tarry-looking stools  For urgent or emergent issues, a  gastroenterologist can be reached at any hour by calling [phone].   DIET:  We do recommend a small meal at first, but then you may proceed to your regular diet.  Drink plenty of fluids but you should avoid alcoholic beverages for 24 hours.  ACTIVITY:  You should plan to take it easy for the rest of today and you should NOT DRIVE or use heavy machinery until tomorrow (because of the sedation medicines used during the test).    FOLLOW UP: Our staff will call the number listed on your records the next business day following your procedure to check on you and address any questions or concerns that you may have regarding the information given to you following your procedure. If we do not reach you, we will leave a message.  However, if you are feeling well and you are not experiencing any problems, there is no need to return our call.  We will assume that you have returned to your regular daily activities without incident.  If any biopsies were taken you will be contacted by phone or by letter within the next 1-3 weeks.  Please call us at [phone] if you have not heard about the biopsies in 3 weeks.    SIGNATURES/CONFIDENTIALITY: You and/or your care partner have signed paperwork which will be entered into your electronic medical record.  These signatures attest to the fact that that the information above on your After Visit Summary has been reviewed and  is understood.  Full responsibility of the confidentiality of this discharge information lies with you and/or your care-partner.

## 2016-05-10 ENCOUNTER — Telehealth: Payer: Self-pay

## 2016-05-10 NOTE — Telephone Encounter (Signed)
  Follow up Call-  Call back number 05/09/2016  Post procedure Call Back phone  # [phone] hm  Permission to leave phone message Yes  Some recent data might be hidden     Patient questions:  Do you have a fever, pain , or abdominal swelling? No. Pain Score  0 *  Have you tolerated food without any problems? Yes.    Have you been able to return to your normal activities? Yes.    Do you have any questions about your discharge instructions: Diet   No. Medications  No. Follow up visit  No.  Do you have questions or concerns about your Care? No.  Actions: * If pain score is 4 or above: No action needed, pain <4.

## 2016-07-14 ENCOUNTER — Ambulatory Visit (HOSPITAL_COMMUNITY)
Admission: EM | Admit: 2016-07-14 | Discharge: 2016-07-14 | Disposition: A | Discharge status: Home / Self Care | Payer: Managed Care, Other (non HMO) | Attending: Emergency Medicine | Admitting: Emergency Medicine

## 2016-07-14 ENCOUNTER — Ambulatory Visit (INDEPENDENT_AMBULATORY_CARE_PROVIDER_SITE_OTHER): Payer: Managed Care, Other (non HMO)

## 2016-07-14 ENCOUNTER — Encounter (HOSPITAL_COMMUNITY): Payer: Self-pay | Admitting: *Deleted

## 2016-07-14 DIAGNOSIS — J40 Bronchitis, not specified as acute or chronic: Secondary | ICD-10-CM

## 2016-07-14 MED ORDER — AZITHROMYCIN 250 MG PO TABS: ORAL_TABLET | ORAL | 0 refills | Status: DC

## 2016-07-14 MED ORDER — PREDNISONE 50 MG PO TABS: ORAL_TABLET | ORAL | 0 refills | Status: DC

## 2016-07-14 MED ORDER — IBUPROFEN 800 MG PO TABS
800.0000 mg | ORAL_TABLET | Freq: Once | ORAL | Status: AC
  Administered 2016-07-14: 800 mg via ORAL

## 2016-07-14 MED ORDER — IBUPROFEN 800 MG PO TABS
ORAL_TABLET | ORAL | Status: AC
  Filled 2016-07-14: qty 1

## 2016-07-14 NOTE — Discharge Instructions (Signed)
You have bronchitis. Take azithromycin and prednisone as prescribed. You should see improvement in the next 2-3 days. If you are having persistent fevers, difficulty breathing, or are just not getting better, please come back or go to the emergency room.

## 2016-07-14 NOTE — ED Provider Notes (Signed)
Edward Lee    CSN: [id number] Arrival date & time: 07/14/16  1222     History   Chief Complaint Chief Complaint  Patient presents with  . Cough    HPI Edward Lee is a 60 y.o. male.   HPI  He is a 60 year old man here for evaluation of cough. His symptoms started one week ago with cough and chest congestion. Cough is nonproductive, but he can hear rattling in the chest. He states he feels like he cannot take a deep breath. He reports feeling generally weak, but has been able to do his normal for mild walk every day. He reports feeling achy, but states he also has arthritis. He been checking his temperature at home and it was 99.5. Here he has a fever of 102.5. He's been taking Advil Cold and Sinus with some improvement. No nausea or vomiting.  Past Medical History:  Diagnosis Date  . Allergy   . Chronic lower back pain   . Herniated disc    L1  . Hypercholesteremia   . IBS (irritable bowel syndrome)    ? possible  . Prostate cancer [name])     Patient Active Problem List   Diagnosis Date Noted  . Malignant neoplasm of prostate ([place]) 04/26/2015  . IBS (irritable bowel syndrome)   . Urinary frequency 11/28/2011  . Hyperlipidemia 03/21/2011  . PTSD (post-traumatic stress disorder) 09/06/2010    Past Surgical History:  Procedure Laterality Date  . CATARACT EXTRACTION    . COLONOSCOPY    . PILONIDAL CYST EXCISION  60 yrs old  . PROSTATE BIOPSY  02/11/15       Home Medications    Prior to Admission medications   Medication Sig Start Date End Date Taking? Authorizing Provider  azithromycin (ZITHROMAX Z-PAK) 250 MG tablet Take 2 pills today, then 1 pill daily until gone. 07/14/16   Melony Overly, MD  citalopram (CELEXA) 40 MG tablet Take 40 mg by mouth daily.    Historical Provider, MD  meloxicam (MOBIC) 7.5 MG tablet Take 7.5 mg by mouth daily.    Historical Provider, MD  predniSONE (DELTASONE) 50 MG tablet Take 1 pill daily for 5 days. 07/14/16    Melony Overly, MD  sildenafil (REVATIO) 20 MG tablet Take 20 mg by mouth as needed (2 -5 tabs as needed).    Historical Provider, MD  simvastatin (ZOCOR) 20 MG tablet Take 1 tablet (20 mg total) by mouth at bedtime. 01/23/16   [name], MD    Family History Family History  Problem Relation Age of Onset  . Breast cancer Mother     Breast  . Prostate cancer Father   . Colon cancer Neg Hx   . Esophageal cancer Neg Hx   . Rectal cancer Neg Hx   . Stomach cancer Neg Hx   . Pancreatic cancer Neg Hx     Social History Social History  Substance Use Topics  . Smoking status: Never Smoker  . Smokeless tobacco: Never Used  . Alcohol use 4.2 - 4.8 oz/week    7 - 8 Glasses of wine per week     Comment: 1 drink per day     Allergies   Codeine   Review of Systems Review of Systems As in history of present illness  Physical Exam Triage Vital Signs ED Triage Vitals  Enc Vitals Group     BP 07/14/16 1419 127/68     Pulse Rate 07/14/16 1419 82  Resp 07/14/16 1419 18     Temp 07/14/16 1419 102.5 F (39.2 C)     Temp Source 07/14/16 1419 Oral     SpO2 07/14/16 1419 100 %     Weight --      Height --      Head Circumference --      Peak Flow --      Pain Score 07/14/16 1422 6     Pain Loc --      Pain Edu? --      Excl. in College? --    No data found.   Updated Vital Signs BP 127/68 (BP Location: Right Arm)   Pulse 82   Temp 102.5 F (39.2 C) (Oral)   Resp 18   SpO2 100%   Visual Acuity Right Eye Distance:   Left Eye Distance:   Bilateral Distance:    Right Eye Near:   Left Eye Near:    Bilateral Near:     Physical Exam  Constitutional: He is oriented to person, place, and time. He appears well-developed and well-nourished. No distress.  Neck: Neck supple.  Cardiovascular: Normal rate, regular rhythm and normal heart sounds.   No murmur heard. Pulmonary/Chest: Effort normal. No respiratory distress. He has no wheezes. He has no rales.  Diffusely  coarse breath sounds  Neurological: He is alert and oriented to person, place, and time.     UC Treatments / Results  Labs (all labs ordered are listed, but only abnormal results are displayed) Labs Reviewed - No data to display  EKG  EKG Interpretation None       Radiology Dg Chest 2 View  Result Date: 07/14/2016 CLINICAL DATA:  Patient states that he cough(non-productive) fever/aches, sob x 5 days. Non-smoker. EXAM: CHEST  2 VIEW COMPARISON:  None. FINDINGS: The heart size and mediastinal contours are within normal limits. Both lungs are clear. The visualized skeletal structures are unremarkable. IMPRESSION: No active cardiopulmonary disease. Electronically Signed   By: Nolon Nations M.D.   On: 07/14/2016 15:25    Procedures Procedures (including critical care time)  Medications Ordered in UC Medications  ibuprofen (ADVIL,MOTRIN) tablet 800 mg (800 mg Oral Given 07/14/16 1506)     Initial Impression / Assessment and Plan / UC Course  I have reviewed the triage vital signs and the nursing notes.  Pertinent labs & imaging results that were available during my care of the patient were reviewed by me and considered in my medical decision making (see chart for details).  Clinical Course     For bronchitis with azithromycin and prednisone. Patient declines stronger cough medicine. Follow-up as needed.  Final Clinical Impressions(s) / UC Diagnoses   Final diagnoses:  Bronchitis    New Prescriptions Discharge Medication List as of 07/14/2016  3:31 PM    START taking these medications   Details  azithromycin (ZITHROMAX Z-PAK) 250 MG tablet Take 2 pills today, then 1 pill daily until gone., Normal    predniSONE (DELTASONE) 50 MG tablet Take 1 pill daily for 5 days., Normal         Melony Overly, MD 07/14/16 [phone]

## 2016-07-14 NOTE — ED Triage Notes (Signed)
Pt  Reports  A    Cough  X  5  Days       Cough  Is  Rattling      With   Shortness  Of  Breath         Weakness    As   Well  Body  Aches     Pt  Has   A  Fever

## 2016-08-17 ENCOUNTER — Telehealth: Payer: Self-pay | Admitting: Family Medicine

## 2016-08-17 NOTE — Telephone Encounter (Signed)
Pt request refill  dicyclomine (BENTYL) 20 MG tablet   [facility]

## 2016-08-17 NOTE — Telephone Encounter (Signed)
Overview Addendum 12/17/2014 5:24 PM by [name], MD  Treated in past with bentyl. Willing to resume for intermittent cramping, bloating, diarrhea as up to date on colonoscopy   May refill

## 2016-08-21 ENCOUNTER — Other Ambulatory Visit: Payer: Self-pay

## 2016-08-21 MED ORDER — DICYCLOMINE HCL 20 MG PO TABS: 20.0000 mg | ORAL_TABLET | Freq: Four times a day (QID) | ORAL | 1 refills | Status: DC

## 2016-08-21 NOTE — Telephone Encounter (Signed)
Prescription sent to pharmacy. Called patient to let him know

## 2016-11-19 LAB — PSA: PSA: 0.63

## 2016-12-05 ENCOUNTER — Encounter: Payer: Self-pay | Admitting: Family Medicine

## 2017-01-08 ENCOUNTER — Other Ambulatory Visit: Payer: Self-pay | Admitting: Family Medicine

## 2017-01-10 ENCOUNTER — Ambulatory Visit (INDEPENDENT_AMBULATORY_CARE_PROVIDER_SITE_OTHER): Payer: 59 | Admitting: Family Medicine

## 2017-01-10 ENCOUNTER — Encounter: Payer: Self-pay | Admitting: Family Medicine

## 2017-01-10 VITALS — BP 112/72 | HR 62 | Temp 98.6°F | Ht 73.75 in | Wt 195.8 lb

## 2017-01-10 DIAGNOSIS — G8929 Other chronic pain: Secondary | ICD-10-CM | POA: Diagnosis not present

## 2017-01-10 DIAGNOSIS — E785 Hyperlipidemia, unspecified: Secondary | ICD-10-CM | POA: Diagnosis not present

## 2017-01-10 DIAGNOSIS — Z0001 Encounter for general adult medical examination with abnormal findings: Secondary | ICD-10-CM

## 2017-01-10 DIAGNOSIS — M545 Low back pain, unspecified: Secondary | ICD-10-CM | POA: Insufficient documentation

## 2017-01-10 DIAGNOSIS — Z0000 Encounter for general adult medical examination without abnormal findings: Secondary | ICD-10-CM

## 2017-01-10 DIAGNOSIS — F4310 Post-traumatic stress disorder, unspecified: Secondary | ICD-10-CM

## 2017-01-10 LAB — LIPID PANEL
Cholesterol: 174 mg/dL (ref 0–200)
HDL: 71 mg/dL (ref >39.00)
LDL Cholesterol: 92 mg/dL (ref 0–99)
NonHDL: 102.66
Total CHOL/HDL Ratio: 2
Triglycerides: 51 mg/dL (ref 0.0–149.0)
VLDL: 10.2 mg/dL (ref 0.0–40.0)

## 2017-01-10 LAB — COMPREHENSIVE METABOLIC PANEL
ALT: 18 U/L (ref 0–53)
AST: 22 U/L (ref 0–37)
Albumin: 4.6 g/dL (ref 3.5–5.2)
Alkaline Phosphatase: 32 U/L — ABNORMAL LOW (ref 39–117)
BUN: 28 mg/dL — ABNORMAL HIGH (ref 6–23)
CO2: 31 mEq/L (ref 19–32)
Calcium: 9.6 mg/dL (ref 8.4–10.5)
Chloride: 104 mEq/L (ref 96–112)
Creatinine, Ser: 1.23 mg/dL (ref 0.40–1.50)
GFR: 63.23 mL/min (ref >60.00)
Glucose, Bld: 104 mg/dL — ABNORMAL HIGH (ref 70–99)
Potassium: 4.7 mEq/L (ref 3.5–5.1)
Sodium: 139 mEq/L (ref 135–145)
Total Bilirubin: 0.6 mg/dL (ref 0.2–1.2)
Total Protein: 7 g/dL (ref 6.0–8.3)

## 2017-01-10 LAB — CBC
HCT: 43.4 % (ref 39.0–52.0)
Hemoglobin: 14.8 g/dL (ref 13.0–17.0)
MCHC: 34 g/dL (ref 30.0–36.0)
MCV: 89.9 fl (ref 78.0–100.0)
Platelets: 212 10*3/uL (ref 150.0–400.0)
RBC: 4.83 Mil/uL (ref 4.22–5.81)
RDW: 13.7 % (ref 11.5–15.5)
WBC: 5.2 10*3/uL (ref 4.0–10.5)

## 2017-01-10 MED ORDER — CITALOPRAM HYDROBROMIDE 40 MG PO TABS: 40.0000 mg | ORAL_TABLET | Freq: Every day | ORAL | 3 refills | Status: DC

## 2017-01-10 NOTE — Patient Instructions (Signed)
You are doing fantastic as usual. Great job trimming those 8 lbs off!   Please stop by lab before you go

## 2017-01-10 NOTE — Assessment & Plan Note (Signed)
HLD- update lipds on simvastatin 20mg 

## 2017-01-10 NOTE — Assessment & Plan Note (Addendum)
Deals with chronic low back pain. Sparing mobic 7.5 mg- willing to refill Urinary incontinence- no clear cause despite extensive evaluation by Dr. Wendy Poet. Possible couldbe from nerve damage from prior disc protrusion

## 2017-01-10 NOTE — Assessment & Plan Note (Signed)
PTSD- stable on celexa 40mg  with no SI/HI. PHQ2 0

## 2017-01-10 NOTE — Progress Notes (Signed)
Phone: [phone]  Subjective:  Patient presents today for their annual physical. Chief complaint-noted.   See problem oriented charting- ROS- full  review of systems was completed and negative except for: chronic urinary incontinence. Chronic low back pain  The following were reviewed and entered/updated in epic: Past Medical History:  Diagnosis Date  . Allergy   . Chronic lower back pain   . Herniated disc    L1  . Hypercholesteremia   . IBS (irritable bowel syndrome)    ? possible  . Prostate cancer [facility])    Patient Active Problem List   Diagnosis Date Noted  . Malignant neoplasm of prostate ([place]) 04/26/2015    Priority: High  . Chronic low back pain 01/10/2017    Priority: Medium  . Urinary frequency 11/28/2011    Priority: Medium  . Hyperlipidemia 03/21/2011    Priority: Medium  . PTSD (post-traumatic stress disorder) 09/06/2010    Priority: Medium  . IBS (irritable bowel syndrome)     Priority: Low   Past Surgical History:  Procedure Laterality Date  . CATARACT EXTRACTION    . COLONOSCOPY    . PILONIDAL CYST EXCISION  61 yrs old  . PROSTATE BIOPSY  02/11/15    Family History  Problem Relation Age of Onset  . Breast cancer Mother        survived without recurrence  . Prostate cancer Father        around age 49 -surgeyr  . Dementia Father        died from this at age 5  . Healthy Sister   . Healthy Brother   . Hemochromatosis Son        none in patient  . Down syndrome Son   . Colon cancer Neg Hx   . Esophageal cancer Neg Hx   . Rectal cancer Neg Hx   . Stomach cancer Neg Hx   . Pancreatic cancer Neg Hx     Medications- reviewed and updated Current Outpatient Prescriptions  Medication Sig Dispense Refill  . citalopram (CELEXA) 40 MG tablet Take 1 tablet (40 mg total) by mouth daily. 90 tablet 3  . dicyclomine (BENTYL) 20 MG tablet Take 1 tablet (20 mg total) by mouth every 6 (six) hours. 60 tablet 1  . meloxicam (MOBIC) 7.5 MG tablet Take  7.5 mg by mouth daily.    . sildenafil (REVATIO) 20 MG tablet Take 20 mg by mouth as needed (2 -5 tabs as needed).    . simvastatin (ZOCOR) 20 MG tablet TAKE 1 TABLET AT BEDTIME 90 tablet 3   No current facility-administered medications for this visit.     Allergies-reviewed and updated Allergies  Allergen Reactions  . Codeine     REACTION: nausea vomiting    Social History   Social History  . Marital status: Married    Spouse name: N/A  . Number of children: 2  . Years of education: N/A   Occupational History  . Buyer, retail    Social History Main Topics  . Smoking status: Never Smoker  . Smokeless tobacco: Never Used  . Alcohol use 8.4 - 9.0 oz/week    7 - 8 Glasses of wine, 7 Standard drinks or equivalent per week  . Drug use: No  . Sexual activity: Yes   Other Topics Concern  . None   Social History Narrative   Married 32. Son with downs syndrome and daughter is deaf.       Works for [employer] from  home.       Hobbies: enjoys walking/exercise, painting, gardening.     Objective: BP 112/72 (BP Location: Left Arm, Patient Position: Sitting, Cuff Size: Large)   Pulse 62   Temp 98.6 F (37 C) (Oral)   Ht 6' 1.75" (1.873 m)   Wt 195 lb 12.8 oz (88.8 kg)   SpO2 98%   BMI 25.31 kg/m  Gen: NAD, resting comfortably HEENT: Mucous membranes are moist. Oropharynx normal Neck: no thyromegaly CV: RRR no murmurs rubs or gallops Lungs: CTAB no crackles, wheeze, rhonchi Abdomen: soft/nontender/nondistended/normal bowel sounds. No rebound or guarding.  Ext: no edema Skin: warm, dry Neuro: grossly normal, moves all extremities, PERRLA Rectal deferred to urology  Assessment/Plan:  61 y.o. male presenting for annual physical.  Health Maintenance counseling: 1. Anticipatory guidance: Patient counseled regarding regular dental exams - q6 months, eye exams - yearly, wearing seatbelts.  2. Risk factor reduction:  Advised patient of need for  regular exercise and diet rich and fruits and vegetables to reduce risk of heart attack and stroke. Exercise- walks 4 miles a day still- at least 5 days a week sometimes six. Diet-down 8 lbs over last year- works on having rather low carb, cheeses, fatty foods. Heavy in fish, chicken, veggies Wt Readings from Last 3 Encounters:  01/10/17 195 lb 12.8 oz (88.8 kg)  05/09/16 202 lb (91.6 kg)  03/22/16 202 lb 9.6 oz (91.9 kg)  3. Immunizations/screenings/ancillary studies- offered shingrix - declines today. Had shingles age 17 and rather severe- wants to hold off for now.  Immunization History  Administered Date(s) Administered  . Td 01/14/1997, 01/20/2008  4. Prostate cancer surveillance- under active surveillance with urology. PSAs actually trending down. Biopsy July 2016 Gleason 3+3=6 . Continue monitoring Lab Results  Component Value Date   PSA 0.63 11/19/2016   PSA 0.98 05/30/2015   PSA 2.31 12/31/2014   5. Colon cancer screening - 04/2016 with 10 year follow up.  6. Skin cancer screening- no concerning skin lesions- had derm visit in January.   Status of chronic or acute concerns   Chronic low back pain Deals with chronic low back pain. Sparing mobic 7.5 mg- willing to refill Urinary incontinence- no clear cause despite extensive evaluation by Dr. Wendy Poet. Possible couldbe from nerve damage from prior disc protrusion  Hyperlipidemia HLD- update lipds on simvastatin 20mg   PTSD (post-traumatic stress disorder) PTSD- stable on celexa 40mg  with no SI/HI. PHQ2 0   1 year physical  Orders Placed This Encounter  Procedures  . CBC    [place]  . Comprehensive metabolic panel    [place]    Order Specific Question:   Has the patient fasted?    Answer:   No  . Lipid panel    [place]    Order Specific Question:   Has the patient fasted?    Answer:   No    Meds ordered this encounter  Medications  . citalopram (CELEXA) 40 MG tablet    Sig: Take 1 tablet (40 mg total) by mouth  daily.    Dispense:  90 tablet    Refill:  3    Return precautions advised.   [name], MD

## 2017-01-21 ENCOUNTER — Other Ambulatory Visit: Payer: Self-pay

## 2017-01-21 ENCOUNTER — Encounter: Payer: Self-pay | Admitting: Family Medicine

## 2017-01-21 MED ORDER — CITALOPRAM HYDROBROMIDE 40 MG PO TABS: 40.0000 mg | ORAL_TABLET | Freq: Every day | ORAL | 3 refills | Status: DC

## 2017-03-11 ENCOUNTER — Ambulatory Visit (INDEPENDENT_AMBULATORY_CARE_PROVIDER_SITE_OTHER): Payer: 59 | Admitting: Family Medicine

## 2017-03-11 ENCOUNTER — Encounter: Payer: Self-pay | Admitting: Family Medicine

## 2017-03-11 ENCOUNTER — Ambulatory Visit (INDEPENDENT_AMBULATORY_CARE_PROVIDER_SITE_OTHER)
Admission: RE | Admit: 2017-03-11 | Discharge: 2017-03-11 | Disposition: A | Discharge status: Home / Self Care | Payer: 59 | Source: Ambulatory Visit | Attending: Family Medicine | Admitting: Family Medicine

## 2017-03-11 VITALS — BP 110/70 | HR 64 | Temp 98.7°F | Ht 73.75 in | Wt 200.8 lb

## 2017-03-11 DIAGNOSIS — M25512 Pain in left shoulder: Secondary | ICD-10-CM | POA: Diagnosis not present

## 2017-03-11 DIAGNOSIS — R29898 Other symptoms and signs involving the musculoskeletal system: Secondary | ICD-10-CM

## 2017-03-11 MED ORDER — PREDNISONE 20 MG PO TABS: ORAL_TABLET | ORAL | 0 refills | Status: DC

## 2017-03-11 NOTE — Progress Notes (Signed)
Subjective:  Edward Lee is a 61 y.o. year old very pleasant male patient who presents for/with See problem oriented charting ROS-  No paresthesias, not dropping objects. No chest pain or shortness of breath- including with exertion. Denies neck pain. Denies fecal incontinence. Stable baseline urinary incontinence.    Past Medical History-  Patient Active Problem List   Diagnosis Date Noted  . Malignant neoplasm of prostate ([place]) 04/26/2015    Priority: High  . Chronic low back pain 01/10/2017    Priority: Medium  . Urinary frequency 11/28/2011    Priority: Medium  . Hyperlipidemia 03/21/2011    Priority: Medium  . PTSD (post-traumatic stress disorder) 09/06/2010    Priority: Medium  . IBS (irritable bowel syndrome)     Priority: Low    Medications- reviewed and updated Current Outpatient Prescriptions  Medication Sig Dispense Refill  . citalopram (CELEXA) 40 MG tablet Take 1 tablet (40 mg total) by mouth daily. 90 tablet 3  . dicyclomine (BENTYL) 20 MG tablet Take 1 tablet (20 mg total) by mouth every 6 (six) hours. 60 tablet 1  . meloxicam (MOBIC) 7.5 MG tablet Take 7.5 mg by mouth daily.    . sildenafil (REVATIO) 20 MG tablet Take 20 mg by mouth as needed (2 -5 tabs as needed).    . simvastatin (ZOCOR) 20 MG tablet TAKE 1 TABLET AT BEDTIME 90 tablet 3  . predniSONE (DELTASONE) 20 MG tablet Take 2 pills for 3 days, 1 pill for 4 days 10 tablet 0   No current facility-administered medications for this visit.     Objective: BP 110/70 (BP Location: Left Arm, Patient Position: Sitting, Cuff Size: Large)   Pulse 64   Temp 98.7 F (37.1 C) (Oral)   Ht 6' 1.75" (1.873 m)   Wt 200 lb 12.8 oz (91.1 kg)   SpO2 95%   BMI 25.96 kg/m  Gen: NAD, resting comfortably CV: RRR no murmurs rubs or gallops Lungs: CTAB no crackles, wheeze, rhonchi Ext: no edema Skin: warm, dry Neuro: negative Spurling test. 5-/5 grip strength on the left compared to 5/5 on right. Intact distal  sensation  Left Shoulder: Inspection reveals no abnormalities, atrophy or asymmetry. Palpation is normal with no tenderness over AC joint or bicipital groove. Does have some pain with palpation medial to left scapula ROM is full in all planes. Rotator cuff strength normal throughout. No signs of impingement with negative Neer and Hawkin's tests, empty can. No painful arc and no drop arm sign.  Assessment/Plan:  Acute pain of left shoulder - Plan: DG Cervical Spine Complete Left hand weakness - Plan: DG Cervical Spine Complete S: left shoulder pain (posterior) for 3 weeks. Felt like an ice pick at first. Comes and goes. Has to concentrate to hold objects- left hand feels weaker. No paresthesias. Feels weaker in forearm arm as well- 60 % of strength. Right now pain 5/10 , at its worse was 10/10. right hand dominant.   Not worse with arm or shoulder motion or neck motion. Not much makes it better- ibuprofen takes edge off. Heating pad helps slightly. If he leaves his arms by his side for any extended period seems to trigger it. Worse in the morning. mobic tried one day when wasn't taking ibuprofen and that didn't help either.   Never had anything like this before. Has had short term issues in the past similar but always less than an hour.  Known cervical arthritis he states but has not had films in  years.  A/P: 61 year old man with known cervical arthritis with Left shoulder pain and left forearm and hand weakness. Concerning for cervical radiculopathy. Trial prednisone 7 days. If not improved- will refer to Dr. Paulla Fore of sports medicine  Orders Placed This Encounter  Procedures  . DG Cervical Spine Complete    Standing Status:   Future    Standing Expiration Date:   05/11/2018    Order Specific Question:   Reason for Exam (SYMPTOM  OR DIAGNOSIS REQUIRED)    Answer:   left shoulder pain, left hand weakness    Order Specific Question:   Preferred imaging location?    Answer:   [facility]    Order Specific Question:   Radiology Contrast Protocol - do NOT remove file path    Answer:   [email or web address]    Meds ordered this encounter  Medications  . predniSONE (DELTASONE) 20 MG tablet    Sig: Take 2 pills for 3 days, 1 pill for 4 days    Dispense:  10 tablet    Refill:  0    Return precautions advised.  [name], MD

## 2017-03-11 NOTE — Patient Instructions (Signed)
Suspect this is coming from your neck  Known arthritis in low back- high chance of having some in the neck too  Please go to [facility] - located [address] across [address] from [place] - in the basement - Hours: 8:30-5:30 PM M-F. Do not need appointment.    Trial prednisone for next 7 days. If not better in 10 days (at least 50%) let me refer you to our sports medicine physician Dr. Paulla Fore

## 2017-03-19 ENCOUNTER — Other Ambulatory Visit: Payer: Self-pay

## 2017-03-19 ENCOUNTER — Encounter: Payer: Self-pay | Admitting: Family Medicine

## 2017-03-19 DIAGNOSIS — R29898 Other symptoms and signs involving the musculoskeletal system: Secondary | ICD-10-CM

## 2017-03-22 ENCOUNTER — Encounter: Payer: Self-pay | Admitting: Family Medicine

## 2017-03-25 MED ORDER — PREDNISONE 20 MG PO TABS: ORAL_TABLET | ORAL | 0 refills | Status: DC

## 2017-04-15 ENCOUNTER — Telehealth: Payer: Self-pay | Admitting: Family Medicine

## 2017-04-15 NOTE — Telephone Encounter (Signed)
Did symptoms resolve on prednisone? Or did he see someone else?

## 2017-04-15 NOTE — Telephone Encounter (Signed)
Patient cancelled appointment with Dr. Paulla Fore. He states he no longer needs referral.

## 2017-04-16 NOTE — Telephone Encounter (Signed)
Called and spoke to patient who states he did not take the prednisone and he has not seen someone else. He feels his symptoms have resolved and he doesn't see the need to keep the appointment since he feels better

## 2017-04-17 ENCOUNTER — Ambulatory Visit: Payer: 59 | Admitting: Sports Medicine

## 2017-04-17 ENCOUNTER — Other Ambulatory Visit: Payer: Self-pay | Admitting: Family Medicine

## 2017-04-19 ENCOUNTER — Ambulatory Visit: Payer: 59 | Admitting: Sports Medicine

## 2017-04-24 ENCOUNTER — Encounter: Payer: Self-pay | Admitting: Family Medicine

## 2017-04-24 MED ORDER — CITALOPRAM HYDROBROMIDE 40 MG PO TABS: 40.0000 mg | ORAL_TABLET | Freq: Every day | ORAL | 3 refills | Status: DC

## 2017-05-02 ENCOUNTER — Encounter: Payer: Self-pay | Admitting: Family Medicine

## 2017-05-16 ENCOUNTER — Encounter: Payer: Self-pay | Admitting: Family Medicine

## 2017-05-24 ENCOUNTER — Encounter: Payer: Self-pay | Admitting: Family Medicine

## 2017-05-24 ENCOUNTER — Ambulatory Visit: Payer: 59 | Admitting: Family Medicine

## 2017-05-24 ENCOUNTER — Ambulatory Visit (INDEPENDENT_AMBULATORY_CARE_PROVIDER_SITE_OTHER): Payer: 59

## 2017-05-24 VITALS — BP 110/70 | HR 67 | Temp 98.4°F | Ht 73.75 in | Wt 197.2 lb

## 2017-05-24 DIAGNOSIS — R0989 Other specified symptoms and signs involving the circulatory and respiratory systems: Secondary | ICD-10-CM | POA: Diagnosis not present

## 2017-05-24 DIAGNOSIS — R05 Cough: Secondary | ICD-10-CM

## 2017-05-24 DIAGNOSIS — R059 Cough, unspecified: Secondary | ICD-10-CM

## 2017-05-24 NOTE — Progress Notes (Signed)
Subjective:  Edward Lee is a 61 y.o. year old very pleasant male patient who presents for/with See problem oriented charting ROS- no fever. Has had some mild shortness of breath with exercise. Some couh but improving. Denies sinus congestion   Past Medical History-  Patient Active Problem List   Diagnosis Date Noted  . Malignant neoplasm of prostate ([place]) 04/26/2015    Priority: High  . Chronic low back pain 01/10/2017    Priority: Medium  . Urinary frequency 11/28/2011    Priority: Medium  . Hyperlipidemia 03/21/2011    Priority: Medium  . PTSD (post-traumatic stress disorder) 09/06/2010    Priority: Medium  . IBS (irritable bowel syndrome)     Priority: Low    Medications- reviewed and updated Current Outpatient Medications  Medication Sig Dispense Refill  . citalopram (CELEXA) 40 MG tablet Take 1 tablet (40 mg total) by mouth daily. 90 tablet 3  . dicyclomine (BENTYL) 20 MG tablet Take 1 tablet (20 mg total) by mouth every 6 (six) hours. 60 tablet 1  . meloxicam (MOBIC) 7.5 MG tablet Take 7.5 mg by mouth daily.    . sildenafil (REVATIO) 20 MG tablet Take 20 mg by mouth as needed (2 -5 tabs as needed).    . simvastatin (ZOCOR) 20 MG tablet TAKE 1 TABLET AT BEDTIME 90 tablet 3   No current facility-administered medications for this visit.     Objective: BP 110/70 (BP Location: Left Arm, Patient Position: Sitting, Cuff Size: Large)   Pulse 67   Temp 98.4 F (36.9 C) (Oral)   Ht 6' 1.75" (1.873 m)   Wt 197 lb 3.2 oz (89.4 kg)   SpO2 96%   BMI 25.49 kg/m  Gen: NAD, resting comfortably, well appearing Oropharynx and nares normal CV: RRR no murmurs rubs or gallops Lungs: CTAB no crackles, wheeze, rhonchi Abdomen: soft/nontender/nondistended/normal bowel sounds.  Ext: no edema Skin: warm, dry  Dg Chest 2 View  Result Date: 05/24/2017 CLINICAL DATA:  Cough and congestion 6 weeks. Mild shortness-of-breath. EXAM: CHEST  2 VIEW COMPARISON:  07/14/2016 FINDINGS: Lungs  are adequately inflated without consolidation or effusion. Cardiomediastinal silhouette is normal. Mild degenerate change of the spine. IMPRESSION: No active cardiopulmonary disease. Electronically Signed   By: [name] M.D.   On: 05/24/2017 13:24   Assessment/Plan:  Chest congestion and cough x 6 weeks - Plan: DG Chest 2 View S: Cough for 6 weeks- starting to improve . Still feels like he has the same congestion that started at the same time though- dates back into September. Started with chest cold coming [place] from [place]. Mild shortness of breath with exercise. No issues walking down the hall or without exertion. No fever .advil cold and sinus early on but no other treatments tried. Has prednisone from September never used A/P: 61 year old with 6 weeks of cough and chest congestion. Considered prednisone for post viral cough but with reported mild SOB opted to get CXR to rule out walking pneumonia. None on x-ray. Will use prednisone he has on hand along with mucinex for 1 week. May need to consider other causes of chronic cough if fails to improve.   Orders Placed This Encounter  Procedures  . DG Chest 2 View    Standing Status:   Future    Number of Occurrences:   1    Standing Expiration Date:   07/24/2018    Order Specific Question:   Reason for Exam (SYMPTOM  OR DIAGNOSIS REQUIRED)  Answer:   chest congestion 6 weeks. rule out pneumonia    Order Specific Question:   Preferred imaging location?    Answer:   [place]   Return precautions advised.  [name], MD

## 2017-05-24 NOTE — Patient Instructions (Signed)
X-ray today. May leave after that  Will call or mychart with results  If x-ray does not show pneumonia- lets use the prednisone along with mucinex for a week to see if we can improve symptoms for you

## 2017-06-18 ENCOUNTER — Encounter: Payer: Self-pay | Admitting: Family Medicine

## 2017-06-18 MED ORDER — PREDNISONE 20 MG PO TABS: ORAL_TABLET | ORAL | 0 refills | Status: DC

## 2017-06-28 ENCOUNTER — Encounter: Payer: Self-pay | Admitting: Family Medicine

## 2017-07-05 ENCOUNTER — Ambulatory Visit: Payer: 59 | Admitting: Family Medicine

## 2017-07-05 ENCOUNTER — Encounter: Payer: Self-pay | Admitting: Family Medicine

## 2017-07-05 VITALS — BP 108/70 | HR 66 | Temp 97.9°F | Ht 73.75 in | Wt 197.8 lb

## 2017-07-05 DIAGNOSIS — R062 Wheezing: Secondary | ICD-10-CM | POA: Diagnosis not present

## 2017-07-05 DIAGNOSIS — R0602 Shortness of breath: Secondary | ICD-10-CM

## 2017-07-05 MED ORDER — ALBUTEROL SULFATE HFA 108 (90 BASE) MCG/ACT IN AERS
2.0000 | INHALATION_SPRAY | Freq: Four times a day (QID) | RESPIRATORY_TRACT | 2 refills | Status: DC | PRN
Start: 2017-07-05 — End: 2018-08-26

## 2017-07-05 MED ORDER — PREDNISONE 20 MG PO TABS: ORAL_TABLET | ORAL | 0 refills | Status: DC

## 2017-07-05 MED ORDER — FLUTICASONE PROPIONATE HFA 220 MCG/ACT IN AERO: 2.0000 | INHALATION_SPRAY | Freq: Two times a day (BID) | RESPIRATORY_TRACT | 12 refills | Status: DC

## 2017-07-05 NOTE — Progress Notes (Signed)
Subjective:  Edward Lee is a 61 y.o. year old very pleasant male patient who presents for/with See problem oriented charting ROS- no fever, chills, nausea, vomiting. No edema.    Past Medical History-  Patient Active Problem List   Diagnosis Date Noted  . Malignant neoplasm of prostate ([place]) 04/26/2015    Priority: High  . Chronic low back pain 01/10/2017    Priority: Medium  . Urinary frequency 11/28/2011    Priority: Medium  . Hyperlipidemia 03/21/2011    Priority: Medium  . PTSD (post-traumatic stress disorder) 09/06/2010    Priority: Medium  . IBS (irritable bowel syndrome)     Priority: Low    Medications- reviewed and updated Current Outpatient Medications  Medication Sig Dispense Refill  . citalopram (CELEXA) 40 MG tablet Take 1 tablet (40 mg total) by mouth daily. 90 tablet 3  . dicyclomine (BENTYL) 20 MG tablet Take 1 tablet (20 mg total) by mouth every 6 (six) hours. 60 tablet 1  . meloxicam (MOBIC) 7.5 MG tablet Take 7.5 mg by mouth daily.    . sildenafil (REVATIO) 20 MG tablet Take 20 mg by mouth as needed (2 -5 tabs as needed).    . simvastatin (ZOCOR) 20 MG tablet TAKE 1 TABLET AT BEDTIME 90 tablet 3   No current facility-administered medications for this visit.     Objective: BP 108/70 (BP Location: Left Arm, Patient Position: Sitting, Cuff Size: Large)   Pulse 66   Temp 97.9 F (36.6 C) (Oral)   Ht 6' 1.75" (1.873 m)   Wt 197 lb 12.8 oz (89.7 kg)   SpO2 97%   BMI 25.57 kg/m  Gen: NAD, resting comfortably CV: RRR no murmurs rubs or gallops Lungs: CTAB no crackles, wheeze, rhonchi Abdomen: soft/nontender/nondistended/normal bowel sounds. thin Ext: no edema. No calf pain Skin: warm, dry  Assessment/Plan:  Shortness of breath - Plan: Ambulatory referral to Pulmonology  Wheezing - Plan: Ambulatory referral to Pulmonology S: Patient complains of recurrent periods of chest congestion, tightness, and wheezing which responds to prednisone very well  but by the time he stops taking the medicine- symptoms seem to recur shortly after.   Triggers: cold, moist, nighttime   03/11/17 Steroid for shoulder blade late summer helped with his symptoms (although per mychart messages appears he did not take the prednisone) 05/24/17 treated with prednisone for chest congestion and cough for 6 weeks. X-ray without pneumonia.  06/18/17 we repeated the prednisone  Most recently , Symptoms coming back within 24-48 hours of finishing prednisone.   Today he feels ok and is not having any wheezing but feels he may get worse again tonight which is his general trend.   A/P: Patient with history of asthma in several family members- his brother has very similar symptoms to him. One of family members symptoms started in adult life.   We will use prednisone again at lower dose while also placing him on inhaled steroids as below. Will give albuterol this time (have not used previous). Also refer to pulmonary  Patient Instructions  Concerned about potential asthma given recurrence of wheezing, tightness in chest  For the underlying potential asthma we will try to calm it down with flovent twice a day. Prednisone to help calm it down while the flovent kicks in.   Albuterol is a rescue inhaler which you can use for short term 4-6 hour symptom relief  We will call you within a week or two about your referral to pulmonary as you will  likely need pulmonary function tests to formally diagnose asthma if present. If you do not hear within 3 weeks, give [place] a call.    Orders Placed This Encounter  Procedures  . Ambulatory referral to Pulmonology    Referral Priority:   Routine    Referral Type:   Consultation    Referral Reason:   Specialty Services Required    Requested Specialty:   Pulmonary Disease    Number of Visits Requested:   1    Meds ordered this encounter  Medications  . fluticasone (FLOVENT HFA) 220 MCG/ACT inhaler    Sig: Inhale 2 puffs into the lungs  2 (two) times daily.    Dispense:  1 Inhaler    Refill:  12  . predniSONE (DELTASONE) 20 MG tablet    Sig: Take 1 tablet by mouth daily for 5 days, then 1/2 tablet daily for 2 days    Dispense:  6 tablet    Refill:  0  . albuterol (PROVENTIL HFA;VENTOLIN HFA) 108 (90 Base) MCG/ACT inhaler    Sig: Inhale 2 puffs into the lungs every 6 (six) hours as needed for wheezing or shortness of breath.    Dispense:  1 Inhaler    Refill:  2    Return precautions advised.  [name], MD

## 2017-07-05 NOTE — Patient Instructions (Addendum)
Concerned about potential asthma given recurrence of wheezing, tightness in chest  For the underlying potential asthma we will try to calm it down with flovent twice a day. Prednisone to help calm it down while the flovent kicks in.   Albuterol is a rescue inhaler which you can use for short term 4-6 hour symptom relief  We will call you within a week or two about your referral to pulmonary as you will likely need pulmonary function tests to formally diagnose asthma if present. If you do not hear within 3 weeks, give [place] a call.

## 2017-07-25 ENCOUNTER — Other Ambulatory Visit (INDEPENDENT_AMBULATORY_CARE_PROVIDER_SITE_OTHER): Payer: 59

## 2017-07-25 ENCOUNTER — Encounter: Payer: Self-pay | Admitting: Pulmonary Disease

## 2017-07-25 ENCOUNTER — Ambulatory Visit: Payer: 59 | Admitting: Pulmonary Disease

## 2017-07-25 VITALS — BP 122/70 | HR 67 | Temp 97.8°F | Ht 74.0 in | Wt 198.6 lb

## 2017-07-25 DIAGNOSIS — R0602 Shortness of breath: Secondary | ICD-10-CM | POA: Diagnosis not present

## 2017-07-25 LAB — CBC WITH DIFFERENTIAL/PLATELET
Basophils Absolute: 0 10*3/uL (ref 0.0–0.1)
Basophils Relative: 0.4 % (ref 0.0–3.0)
Eosinophils Absolute: 0.5 10*3/uL (ref 0.0–0.7)
Eosinophils Relative: 5 % (ref 0.0–5.0)
HCT: 42.4 % (ref 39.0–52.0)
Hemoglobin: 14.2 g/dL (ref 13.0–17.0)
Lymphocytes Relative: 12.4 % (ref 12.0–46.0)
Lymphs Abs: 1.1 10*3/uL (ref 0.7–4.0)
MCHC: 33.4 g/dL (ref 30.0–36.0)
MCV: 91.3 fl (ref 78.0–100.0)
Monocytes Absolute: 0.9 10*3/uL (ref 0.1–1.0)
Monocytes Relative: 9.8 % (ref 3.0–12.0)
Neutro Abs: 6.6 10*3/uL (ref 1.4–7.7)
Neutrophils Relative %: 72.4 % (ref 43.0–77.0)
Platelets: 162 10*3/uL (ref 150.0–400.0)
RBC: 4.64 Mil/uL (ref 4.22–5.81)
RDW: 13.7 % (ref 11.5–15.5)
WBC: 9.1 10*3/uL (ref 4.0–10.5)

## 2017-07-25 LAB — NITRIC OXIDE: Nitric Oxide: 54

## 2017-07-25 MED ORDER — FLUTICASONE FUROATE-VILANTEROL 200-25 MCG/INH IN AEPB: 1.0000 | INHALATION_SPRAY | Freq: Every day | RESPIRATORY_TRACT | 3 refills | Status: DC

## 2017-07-25 MED ORDER — FLUTICASONE FUROATE-VILANTEROL 200-25 MCG/INH IN AEPB: 1.0000 | INHALATION_SPRAY | Freq: Every day | RESPIRATORY_TRACT | 0 refills | Status: DC

## 2017-07-25 MED ORDER — FLUTICASONE FUROATE-VILANTEROL 200-25 MCG/INH IN AEPB: 1.0000 | INHALATION_SPRAY | Freq: Every day | RESPIRATORY_TRACT | 0 refills | Status: AC

## 2017-07-25 NOTE — Progress Notes (Signed)
Edward Lee    [id number]    11/21/1955  Primary Care Physician:Hunter, Brayton Mars, MD  Referring Physician: [name], MD [place], [place] [address]  Chief complaint:  Consult for asthma  HPI: 62 year old with history of allergies, hyperlipidemia, irritable bowel syndrome, prostate cancer.  Complains of increasing dyspnea since September 2018.  Symptoms associated with chest congestion, wheezing mostly at night, nonproductive cough.  Denies any sputum production, fevers, chills.  He was treated with several rounds of prednisone which improves symptoms temporarily.  Started on Flovent in December 2018 by Dr. Yong Channel, primary care.  He is also on albuterol as needed.  Feels that the symptoms are improved but not completely resolved He has history of seasonal allergies, denies acid reflux.  There is a strong history of asthma in the family.    Pets: Dogs, no cats, birds Occupation: Works for the [employer].  Works from home Exposures: No known exposures.  No mold at home.  Hardwood, carpet at home.  Forced air heating Smoking history: Never smoker  Travel History: Traveled to [place], [place] recently.  No other significant travel.  Outpatient Encounter Medications as of 07/25/2017  Medication Sig  . albuterol (PROVENTIL HFA;VENTOLIN HFA) 108 (90 Base) MCG/ACT inhaler Inhale 2 puffs into the lungs every 6 (six) hours as needed for wheezing or shortness of breath.  . citalopram (CELEXA) 40 MG tablet Take 1 tablet (40 mg total) by mouth daily.  Marland Kitchen dicyclomine (BENTYL) 20 MG tablet Take 1 tablet (20 mg total) by mouth every 6 (six) hours.  . fluticasone (FLOVENT HFA) 220 MCG/ACT inhaler Inhale 2 puffs into the lungs 2 (two) times daily.  . meloxicam (MOBIC) 7.5 MG tablet Take 7.5 mg by mouth daily.  . sildenafil (REVATIO) 20 MG tablet Take 20 mg by mouth as needed (2 -5 tabs as needed).  . simvastatin (ZOCOR) 20 MG tablet TAKE 1 TABLET AT BEDTIME   . [DISCONTINUED] predniSONE (DELTASONE) 20 MG tablet Take 1 tablet by mouth daily for 5 days, then 1/2 tablet daily for 2 days   No facility-administered encounter medications on file as of 07/25/2017.     Allergies as of 07/25/2017 - Review Complete 07/25/2017  Allergen Reaction Noted  . Codeine      Past Medical History:  Diagnosis Date  . Allergy   . Chronic lower back pain   . Herniated disc    L1  . Hypercholesteremia   . IBS (irritable bowel syndrome)    ? possible  . Prostate cancer [facility])     Past Surgical History:  Procedure Laterality Date  . CATARACT EXTRACTION    . COLONOSCOPY    . PILONIDAL CYST EXCISION  62 yrs old  . PROSTATE BIOPSY  02/11/15    Family History  Problem Relation Age of Onset  . Breast cancer Mother        survived without recurrence  . Prostate cancer Father        around age 33 -surgeyr  . Dementia Father        died from this at age 32  . Healthy Sister   . Healthy Brother   . Hemochromatosis Son        none in patient  . Down syndrome Son   . Colon cancer Neg Hx   . Esophageal cancer Neg Hx   . Rectal cancer Neg Hx   . Stomach cancer Neg Hx   .  Pancreatic cancer Neg Hx     Social History   Socioeconomic History  . Marital status: Married    Spouse name: Not on file  . Number of children: 2  . Years of education: Not on file  . Highest education level: Not on file  Social Needs  . Financial resource strain: Not on file  . Food insecurity - worry: Not on file  . Food insecurity - inability: Not on file  . Transportation needs - medical: Not on file  . Transportation needs - non-medical: Not on file  Occupational History  . Occupation: Buyer, retail  Tobacco Use  . Smoking status: Never Smoker  . Smokeless tobacco: Never Used  Substance and Sexual Activity  . Alcohol use: Yes    Alcohol/week: 8.4 - 9.0 oz    Types: 7 - 8 Glasses of wine, 7 Standard drinks or equivalent per week    Comment: occ  . Drug use:  No  . Sexual activity: Yes  Other Topics Concern  . Not on file  Social History Narrative   Married 2. Son with downs syndrome and daughter is deaf.       Works for [employer] from home.       Hobbies: enjoys walking/exercise, painting, gardening.     Review of systems: Review of Systems  Constitutional: Negative for fever and chills.  HENT: Negative.   Eyes: Negative for blurred vision.  Respiratory: as per HPI  Cardiovascular: Negative for chest pain and palpitations.  Gastrointestinal: Negative for vomiting, diarrhea, blood per rectum. Genitourinary: Negative for dysuria, urgency, frequency and hematuria.  Musculoskeletal: Negative for myalgias, back pain and joint pain.  Skin: Negative for itching and rash.  Neurological: Negative for dizziness, tremors, focal weakness, seizures and loss of consciousness.  Endo/Heme/Allergies: Negative for environmental allergies.  Psychiatric/Behavioral: Negative for depression, suicidal ideas and hallucinations.  All other systems reviewed and are negative.  Physical Exam: Blood pressure 122/70, pulse 67, temperature 97.8 F (36.6 C), temperature source Oral, height 6\' 2"  (1.88 m), weight 198 lb 9.6 oz (90.1 kg), SpO2 99 %. Gen:      No acute distress HEENT:  EOMI, sclera anicteric Neck:     No masses; no thyromegaly Lungs:    Scattered exp wheeze; normal respiratory effort CV:         Regular rate and rhythm; no murmurs Abd:      + bowel sounds; soft, non-tender; no palpable masses, no distension Ext:    No edema; adequate peripheral perfusion Skin:      Warm and dry; no rash Neuro: alert and oriented x 3 Psych: normal mood and affect  Data Reviewed: FENO 07/25/17-54 Chest x-ray 05/24/17--Clear lungs, no acute abnormality.  I have reviewed the images personally. CBC 12/14/14-WBC count 6.6, absolute eosinophil count 400  Assessment:  Consult for asthma Symptoms are consistent with asthma.  He has elevated FENO and prior  CBCs showing eosinophilia. He is still symptomatic with wheezing in office today.  We will stop Flovent and start Breo.  Continue albuterol rescue inhaler Recheck CBC with differential and blood allergy profile to evaluate eosinophilia and IgE levels. Schedule PFTs.  Return to clinic in 1 month to review results and response to therapy.   Plan/Recommendations: - Stop flovent, start breo - Continue albuterol - Check CBC with differential, blood allergy profile - PFTs    [name] MD [facility] Pager [phone] 07/25/2017, 10:30 AM  CC: [name],  MD   

## 2017-07-25 NOTE — Patient Instructions (Signed)
We will check a CBC with differential, blood allergy profile Stop using the Flovent.  We will start you on Breo 200.  Continue using the albuterol rescue inhaler Return to clinic in 1 month with PFTs.

## 2017-07-26 LAB — RESPIRATORY ALLERGY PROFILE REGION II ~~LOC~~

## 2017-07-26 LAB — INTERPRETATION:

## 2017-07-29 ENCOUNTER — Telehealth: Payer: Self-pay | Admitting: Pulmonary Disease

## 2017-07-29 NOTE — Telephone Encounter (Signed)
Notes recorded by [name], MD on 07/29/2017 at 6:22 AM EST Please let the pt know that the blood tests are normal ------------------------------- Spoke with pt. He is aware of results. Nothing further was needed.

## 2017-07-31 ENCOUNTER — Telehealth: Payer: Self-pay | Admitting: Pulmonary Disease

## 2017-07-31 NOTE — Telephone Encounter (Signed)
Pt returning call Spoke with patient It appears that Lattie Haw called him with his lab results but Ria Comment already spoke with patient regarding his results on 1.14.19  Nothing further needed; will sign off

## 2017-07-31 NOTE — Progress Notes (Signed)
Pt already aware per 1.14.19 and 1.16.19 phone notes

## 2017-08-27 ENCOUNTER — Encounter: Payer: Self-pay | Admitting: Pulmonary Disease

## 2017-08-27 ENCOUNTER — Ambulatory Visit (INDEPENDENT_AMBULATORY_CARE_PROVIDER_SITE_OTHER): Payer: 59 | Admitting: Pulmonary Disease

## 2017-08-27 ENCOUNTER — Ambulatory Visit: Payer: 59 | Admitting: Pulmonary Disease

## 2017-08-27 VITALS — BP 128/82 | HR 68 | Ht 74.0 in | Wt 198.0 lb

## 2017-08-27 DIAGNOSIS — R0602 Shortness of breath: Secondary | ICD-10-CM | POA: Diagnosis not present

## 2017-08-27 DIAGNOSIS — J4530 Mild persistent asthma, uncomplicated: Secondary | ICD-10-CM

## 2017-08-27 LAB — PULMONARY FUNCTION TEST
DL/VA % pred: 87 %
DL/VA: 4.21 ml/min/mmHg/L
DLCO cor % pred: 96 %
DLCO cor: 35.6 ml/min/mmHg
DLCO unc % pred: 94 %
DLCO unc: 35.19 ml/min/mmHg
FEF 25-75 Post: 5.69 L/sec
FEF 25-75 Pre: 4.46 L/sec
FEF2575-%Change-Post: 27 %
FEF2575-%Pred-Post: 181 %
FEF2575-%Pred-Pre: 141 %
FEV1-%Change-Post: 7 %
FEV1-%Pred-Post: 128 %
FEV1-%Pred-Pre: 120 %
FEV1-Post: 5.09 L
FEV1-Pre: 4.74 L
FEV1FVC-%Change-Post: 5 %
FEV1FVC-%Pred-Pre: 103 %
FEV6-%Change-Post: 2 %
FEV6-%Pred-Post: 123 %
FEV6-%Pred-Pre: 120 %
FEV6-Post: 6.18 L
FEV6-Pre: 6.04 L
FEV6FVC-%Change-Post: 0 %
FEV6FVC-%Pred-Post: 105 %
FEV6FVC-%Pred-Pre: 104 %
FVC-%Change-Post: 1 %
FVC-%Pred-Post: 117 %
FVC-%Pred-Pre: 115 %
FVC-Post: 6.18 L
FVC-Pre: 6.1 L
Post FEV1/FVC ratio: 82 %
Post FEV6/FVC ratio: 100 %
Pre FEV1/FVC ratio: 78 %
Pre FEV6/FVC Ratio: 99 %
RV % pred: 89 %
RV: 2.24 L
TLC % pred: 112 %
TLC: 8.7 L

## 2017-08-27 LAB — NITRIC OXIDE: Nitric Oxide: 85

## 2017-08-27 NOTE — Progress Notes (Signed)
PFT done today. 

## 2017-08-27 NOTE — Progress Notes (Signed)
Edward Lee    [id number]    May 25, 1956  Primary Care Physician:Hunter, Brayton Mars, MD  Referring Physician: [name], MD [place], [place] [address]  Chief complaint:  Follow up for asthma  HPI: 62 year old with history of allergies, hyperlipidemia, irritable bowel syndrome, prostate cancer.  Complains of increasing dyspnea since September 2018.  Symptoms associated with chest congestion, wheezing mostly at night, nonproductive cough.  Denies any sputum production, fevers, chills.  He was treated with several rounds of prednisone which improves symptoms temporarily.  Started on Flovent in December 2018 by Dr. Yong Channel, primary care.  He is also on albuterol as needed.  Feels that the symptoms are improved but not completely resolved He has history of seasonal allergies, denies acid reflux.  There is a strong history of asthma in the family.    Pets: Dogs, no cats, birds Occupation: Works for the [employer].  Works from home Exposures: No known exposures.  No mold at home.  Hardwood, carpet at home.  Forced air heating Smoking history: Never smoker  Travel History: Traveled to [place], [place] recently.  No other significant travel.  Interim History: Started on breo at last visit.  He feels that his breathing is doing well.  He actually does not notice any difference between the Flovent and Breo.  He does not have any nighttime awakenings and is not using his albuterol rescue inhaler at all.  Outpatient Encounter Medications as of 08/27/2017  Medication Sig  . albuterol (PROVENTIL HFA;VENTOLIN HFA) 108 (90 Base) MCG/ACT inhaler Inhale 2 puffs into the lungs every 6 (six) hours as needed for wheezing or shortness of breath.  . citalopram (CELEXA) 40 MG tablet Take 1 tablet (40 mg total) by mouth daily.  Marland Kitchen dicyclomine (BENTYL) 20 MG tablet Take 1 tablet (20 mg total) by mouth every 6 (six) hours.  . fluticasone (FLOVENT HFA) 220 MCG/ACT  inhaler Inhale 2 puffs into the lungs 2 (two) times daily.  . fluticasone furoate-vilanterol (BREO ELLIPTA) 200-25 MCG/INH AEPB Inhale 1 puff into the lungs daily.  . meloxicam (MOBIC) 7.5 MG tablet Take 7.5 mg by mouth daily.  . sildenafil (REVATIO) 20 MG tablet Take 20 mg by mouth as needed (2 -5 tabs as needed).  . simvastatin (ZOCOR) 20 MG tablet TAKE 1 TABLET AT BEDTIME   No facility-administered encounter medications on file as of 08/27/2017.     Allergies as of 08/27/2017 - Review Complete 08/27/2017  Allergen Reaction Noted  . Codeine      Past Medical History:  Diagnosis Date  . Allergy   . Chronic lower back pain   . Herniated disc    L1  . Hypercholesteremia   . IBS (irritable bowel syndrome)    ? possible  . Prostate cancer [facility])     Past Surgical History:  Procedure Laterality Date  . CATARACT EXTRACTION    . COLONOSCOPY    . PILONIDAL CYST EXCISION  62 yrs old  . PROSTATE BIOPSY  02/11/15    Family History  Problem Relation Age of Onset  . Breast cancer Mother        survived without recurrence  . Prostate cancer Father        around age 51 -surgeyr  . Dementia Father        died from this at age 62  . Healthy Sister   . Healthy Brother   . Hemochromatosis Son  none in patient  . Down syndrome Son   . Colon cancer Neg Hx   . Esophageal cancer Neg Hx   . Rectal cancer Neg Hx   . Stomach cancer Neg Hx   . Pancreatic cancer Neg Hx     Social History   Socioeconomic History  . Marital status: Married    Spouse name: Not on file  . Number of children: 2  . Years of education: Not on file  . Highest education level: Not on file  Social Needs  . Financial resource strain: Not on file  . Food insecurity - worry: Not on file  . Food insecurity - inability: Not on file  . Transportation needs - medical: Not on file  . Transportation needs - non-medical: Not on file  Occupational History  . Occupation: Buyer, retail  Tobacco Use  .  Smoking status: Never Smoker  . Smokeless tobacco: Never Used  Substance and Sexual Activity  . Alcohol use: Yes    Alcohol/week: 8.4 - 9.0 oz    Types: 7 - 8 Glasses of wine, 7 Standard drinks or equivalent per week    Comment: occ  . Drug use: No  . Sexual activity: Yes  Other Topics Concern  . Not on file  Social History Narrative   Married 39. Son with downs syndrome and daughter is deaf.       Works for [employer] from home.       Hobbies: enjoys walking/exercise, painting, gardening.    Review of systems: Review of Systems  Constitutional: Negative for fever and chills.  HENT: Negative.   Eyes: Negative for blurred vision.  Respiratory: as per HPI  Cardiovascular: Negative for chest pain and palpitations.  Gastrointestinal: Negative for vomiting, diarrhea, blood per rectum. Genitourinary: Negative for dysuria, urgency, frequency and hematuria.  Musculoskeletal: Negative for myalgias, back pain and joint pain.  Skin: Negative for itching and rash.  Neurological: Negative for dizziness, tremors, focal weakness, seizures and loss of consciousness.  Endo/Heme/Allergies: Negative for environmental allergies.  Psychiatric/Behavioral: Negative for depression, suicidal ideas and hallucinations.  All other systems reviewed and are negative.  Physical Exam: Blood pressure 128/82, pulse 68, height 6\' 2"  (1.88 m), weight 198 lb (89.8 kg), SpO2 98 %. Gen:      No acute distress HEENT:  EOMI, sclera anicteric Neck:     No masses; no thyromegaly Lungs:    Clear to auscultation bilaterally; normal respiratory effort CV:         Regular rate and rhythm; no murmurs Abd:      + bowel sounds; soft, non-tender; no palpable masses, no distension Ext:    No edema; adequate peripheral perfusion Skin:      Warm and dry; no rash Neuro: alert and oriented x 3 Psych: normal mood and affect  Data Reviewed: FENO  07/25/17- 54   Chest x-ray 05/24/17--Clear lungs, no acute  abnormality.  I have reviewed the images personally.  CBC 12/14/14-WBC count 6.6, absolute eosinophil count 400  CBC 07/25/17-WBC count 9.1, absolute eosinophil count 500 Blood allergy profile 07/25/17-IgE 58, RAST panel is negative.  PFTs 08/27/17 FVC 6.18 [117%], FEV1 5.09 [128%], F/F 82, TLC 112%, DLCO 94% Normal study.  Assessment:  Mild persistent asthma Symptoms are consistent with asthma with elevated FENO and prior CBCs showing eosinophilia. PFTs reviewed which not show any obstruction.  There is improvement in mid flow rates post albuterol indicating small airways disease  We will recheck FENO today.  Continue  the [facility] for now.  Reassess in 3 months If he is doing as well then we can discuss scaling back the inhaler therapy.  Plan/Recommendations: - Surveyor, mining. Recheck FENO  Return in 3 months.   [name] MD [facility] Pager [phone] 08/27/2017, 11:04 AM  CC: [name], MD

## 2017-08-27 NOTE — Patient Instructions (Signed)
We will recheck FENO today Continue on the Breo We will reassess in 3 months.  Follow-up in clinic in 3 months.

## 2017-08-27 NOTE — Addendum Note (Signed)
Addended by: Maryanna Shape A on: 08/27/2017 11:31 AM   Modules accepted: Orders

## 2017-11-19 ENCOUNTER — Ambulatory Visit: Payer: 59 | Admitting: Pulmonary Disease

## 2017-11-22 ENCOUNTER — Encounter: Payer: Self-pay | Admitting: Pulmonary Disease

## 2017-11-22 ENCOUNTER — Ambulatory Visit: Payer: 59 | Admitting: Pulmonary Disease

## 2017-11-22 VITALS — BP 132/88 | HR 62 | Ht 74.0 in | Wt 200.0 lb

## 2017-11-22 DIAGNOSIS — J4530 Mild persistent asthma, uncomplicated: Secondary | ICD-10-CM | POA: Diagnosis not present

## 2017-11-22 LAB — NITRIC OXIDE: Nitric Oxide: 99

## 2017-11-22 NOTE — Progress Notes (Signed)
Edward Lee    [id number]    [birth year]  Primary Care Physician:Hunter, Brayton Mars, MD  Referring Physician: [name], MD [address], [place] [address]  Chief complaint:  Follow up for asthma  HPI: 62 year old with history of allergies, hyperlipidemia, irritable bowel syndrome, prostate cancer.  Complains of increasing dyspnea since September 2018.  Symptoms associated with chest congestion, wheezing mostly at night, nonproductive cough.  Denies any sputum production, fevers, chills.  He was treated with several rounds of prednisone which improves symptoms temporarily.  Started on Flovent in December 2018 by Dr. Yong Channel, primary care.  He is also on albuterol as needed.  Feels that the symptoms are improved but not completely resolved He has history of seasonal allergies, denies acid reflux.  There is a strong history of asthma in the family.    Pets: Dogs, no cats, birds Occupation: Works for the [employer].  Works from home Exposures: No known exposures.  No mold at home.  Hardwood, carpet at home.  Forced air heating Smoking history: Never smoker  Travel History: Traveled to [place], [place] recently.  No other significant travel.  Interim History: Continues on Breo.  States that breathing is doing well.  He has used the albuterol inhaler once in the past 3 months.  Denies any nocturnal awakening  Outpatient Encounter Medications as of 11/22/2017  Medication Sig  . albuterol (PROVENTIL HFA;VENTOLIN HFA) 108 (90 Base) MCG/ACT inhaler Inhale 2 puffs into the lungs every 6 (six) hours as needed for wheezing or shortness of breath.  . citalopram (CELEXA) 40 MG tablet Take 1 tablet (40 mg total) by mouth daily.  Marland Kitchen dicyclomine (BENTYL) 20 MG tablet Take 1 tablet (20 mg total) by mouth every 6 (six) hours.  . fluticasone (FLOVENT HFA) 220 MCG/ACT inhaler Inhale 2 puffs into the lungs 2 (two) times daily.  . fluticasone furoate-vilanterol (BREO  ELLIPTA) 200-25 MCG/INH AEPB Inhale 1 puff into the lungs daily.  . meloxicam (MOBIC) 7.5 MG tablet Take 7.5 mg by mouth daily.  . sildenafil (REVATIO) 20 MG tablet Take 20 mg by mouth as needed (2 -5 tabs as needed).  . simvastatin (ZOCOR) 20 MG tablet TAKE 1 TABLET AT BEDTIME   No facility-administered encounter medications on file as of 11/22/2017.     Allergies as of 11/22/2017 - Review Complete 11/22/2017  Allergen Reaction Noted  . Codeine      Past Medical History:  Diagnosis Date  . Allergy   . Chronic lower back pain   . Herniated disc    L1  . Hypercholesteremia   . IBS (irritable bowel syndrome)    ? possible  . Prostate cancer [facility])     Past Surgical History:  Procedure Laterality Date  . CATARACT EXTRACTION    . COLONOSCOPY    . PILONIDAL CYST EXCISION  62 yrs old  . PROSTATE BIOPSY  02/11/15    Family History  Problem Relation Age of Onset  . Breast cancer Mother        survived without recurrence  . Prostate cancer Father        around age 44 -surgeyr  . Dementia Father        died from this at age 77  . Healthy Sister   . Healthy Brother   . Hemochromatosis Son        none in patient  . Down syndrome Son   . Colon cancer Neg  Hx   . Esophageal cancer Neg Hx   . Rectal cancer Neg Hx   . Stomach cancer Neg Hx   . Pancreatic cancer Neg Hx     Social History   Socioeconomic History  . Marital status: Married    Spouse name: Not on file  . Number of children: 2  . Years of education: Not on file  . Highest education level: Not on file  Occupational History  . Occupation: Buyer, retail  Social Needs  . Financial resource strain: Not on file  . Food insecurity:    Worry: Not on file    Inability: Not on file  . Transportation needs:    Medical: Not on file    Non-medical: Not on file  Tobacco Use  . Smoking status: Never Smoker  . Smokeless tobacco: Never Used  Substance and Sexual Activity  . Alcohol use: Yes    Alcohol/week:  8.4 - 9.0 oz    Types: 7 - 8 Glasses of wine, 7 Standard drinks or equivalent per week    Comment: occ  . Drug use: No  . Sexual activity: Yes  Lifestyle  . Physical activity:    Days per week: Not on file    Minutes per session: Not on file  . Stress: Not on file  Relationships  . Social connections:    Talks on phone: Not on file    Gets together: Not on file    Attends religious service: Not on file    Active member of club or organization: Not on file    Attends meetings of clubs or organizations: Not on file    Relationship status: Not on file  . Intimate partner violence:    Fear of current or ex partner: Not on file    Emotionally abused: Not on file    Physically abused: Not on file    Forced sexual activity: Not on file  Other Topics Concern  . Not on file  Social History Narrative   Married 71. Son with downs syndrome and daughter is deaf.       Works for [employer] from home.       Hobbies: enjoys walking/exercise, painting, gardening.    Review of systems: Review of Systems  Constitutional: Negative for fever and chills.  HENT: Negative.   Eyes: Negative for blurred vision.  Respiratory: as per HPI  Cardiovascular: Negative for chest pain and palpitations.  Gastrointestinal: Negative for vomiting, diarrhea, blood per rectum. Genitourinary: Negative for dysuria, urgency, frequency and hematuria.  Musculoskeletal: Negative for myalgias, back pain and joint pain.  Skin: Negative for itching and rash.  Neurological: Negative for dizziness, tremors, focal weakness, seizures and loss of consciousness.  Endo/Heme/Allergies: Negative for environmental allergies.  Psychiatric/Behavioral: Negative for depression, suicidal ideas and hallucinations.  All other systems reviewed and are negative.  Physical Exam: Blood pressure 128/82, pulse 68, height 6\' 2"  (1.88 m), weight 198 lb (89.8 kg), SpO2 98 %. Gen:      No acute distress HEENT:  EOMI, sclera  anicteric Neck:     No masses; no thyromegaly Lungs:    Clear to auscultation bilaterally; normal respiratory effort CV:         Regular rate and rhythm; no murmurs Abd:      + bowel sounds; soft, non-tender; no palpable masses, no distension Ext:    No edema; adequate peripheral perfusion Skin:      Warm and dry; no rash Neuro: alert and oriented  x 3 Psych: normal mood and affect  Data Reviewed: FENO  07/25/17- 54 08/27/2017-85 11/22/2017-99  Chest x-ray 05/24/17--Clear lungs, no acute abnormality.  I have reviewed the images personally.  CBC 12/14/14-WBC count 6.6, absolute eosinophil count 400  CBC 07/25/17-WBC count 9.1, absolute eosinophil count 500 Blood allergy profile 07/25/17-IgE 58, RAST panel is negative.  PFTs 08/27/17 FVC 6.18 [117%], FEV1 5.09 [128%], F/F 82, TLC 112%, DLCO 94% Normal study.  Assessment:  Mild persistent asthma Symptoms are consistent with asthma with elevated FENO and prior CBCs showing eosinophilia. PFTs reviewed which not show any obstruction.  There is improvement in mid flow rates post albuterol indicating small airways disease  FENO continues to be elevated.  Patient has been compliant with the inhaler with no break in therapy We will give him instructions on inhaler usage to make sure that the medication is getting into his lungs.   Plan/Recommendations: - Surveyor, mining.  Return in 6 months  [name] MD [facility] 11/22/2017, 10:45 AM  CC: [name], MD

## 2017-11-22 NOTE — Patient Instructions (Signed)
Please continue using the [facility] We will give you instructions on proper usage to make sure that the medication is getting into the lung Follow-up in 6 months.

## 2018-01-03 ENCOUNTER — Other Ambulatory Visit: Payer: Self-pay | Admitting: Family Medicine

## 2018-01-03 ENCOUNTER — Encounter: Payer: Self-pay | Admitting: Pulmonary Disease

## 2018-01-03 NOTE — Telephone Encounter (Signed)
Hi Ryden,  I am sorry you are not feeling well. We can try prednisone taper starting at 40 mg. Reduce dose by 10 mg every 3 days. Start spiriva respimat 2.5 mcg  We can make a sooner appointment in clinic to discuss alternate therapies which may include injections of this does not improve symptoms.  [name] MD [facility] 01/03/2018, 5:41 PM

## 2018-01-06 MED ORDER — PREDNISONE 10 MG PO TABS: ORAL_TABLET | ORAL | 0 refills | Status: DC

## 2018-01-06 MED ORDER — TIOTROPIUM BROMIDE MONOHYDRATE 2.5 MCG/ACT IN AERS: 2.0000 | INHALATION_SPRAY | Freq: Every day | RESPIRATORY_TRACT | 3 refills | Status: DC

## 2018-01-06 NOTE — Telephone Encounter (Signed)
Dr. Vaughan Browner please advise on patients last question on his email. Should he stop taking his arthritis medication while being on prednisone. Thank you.

## 2018-01-06 NOTE — Telephone Encounter (Signed)
I am not sure what IB is?  If this is mobic then we can stop while on prednisone as long as your joint symptoms do not flare up

## 2018-01-28 ENCOUNTER — Encounter: Payer: Self-pay | Admitting: Pulmonary Disease

## 2018-01-28 ENCOUNTER — Other Ambulatory Visit: Payer: 59

## 2018-01-28 ENCOUNTER — Telehealth: Payer: Self-pay | Admitting: Pulmonary Disease

## 2018-01-28 ENCOUNTER — Ambulatory Visit: Payer: 59 | Admitting: Pulmonary Disease

## 2018-01-28 VITALS — BP 126/72 | HR 68 | Ht 74.0 in | Wt 197.0 lb

## 2018-01-28 DIAGNOSIS — J4551 Severe persistent asthma with (acute) exacerbation: Secondary | ICD-10-CM | POA: Diagnosis not present

## 2018-01-28 LAB — NITRIC OXIDE: Nitric Oxide: 157

## 2018-01-28 MED ORDER — PREDNISONE 10 MG PO TABS: ORAL_TABLET | ORAL | 0 refills | Status: DC

## 2018-01-28 MED ORDER — PREDNISONE 10 MG PO TABS: ORAL_TABLET | ORAL | 1 refills | Status: DC

## 2018-01-28 NOTE — Progress Notes (Signed)
Edward Lee    [id number]    06-02-1956  Primary Care Physician:Hunter, Brayton Mars, MD  Referring Physician: [name], MD [address], [place] [address]  Chief complaint:  Follow up for asthma  HPI: 62 year old with history of allergies, hyperlipidemia, irritable bowel syndrome, prostate cancer.  Complains of increasing dyspnea since September 2018 after return from travel to [place].  Symptoms associated with chest congestion, wheezing mostly at night, nonproductive cough.  Denies any sputum production, fevers, chills.  He was treated with several rounds of prednisone which improves symptoms temporarily.  Started on Flovent in December 2018 by Dr. Yong Channel, primary care.  He is also on albuterol as needed.  Feels that the symptoms are improved but not completely resolved He has history of seasonal allergies, denies acid reflux.  There is a strong history of asthma in the family.    Pets: Dogs, no cats, birds Occupation: Works for the [employer].  Works from home Exposures: No known exposures.  No mold at home.  Hardwood, carpet at home.  Forced air heating Smoking history: Never smoker  Travel History: Traveled to [place], [place] recently.  No other significant travel.  Interim History: He states that the only thing that helps with the breathing is a prednisone and symptoms recur as soon as he gets off the prednisone Currently using Spiriva.  He stopped using the Breo inhaler as he was under the impression that he should not use both of them together.  Has daily symptoms of cough, dyspnea, wheezing, nocturnal awakenings.  Outpatient Encounter Medications as of 01/28/2018  Medication Sig  . albuterol (PROVENTIL HFA;VENTOLIN HFA) 108 (90 Base) MCG/ACT inhaler Inhale 2 puffs into the lungs every 6 (six) hours as needed for wheezing or shortness of breath.  . citalopram (CELEXA) 40 MG tablet Take 1 tablet (40 mg total) by mouth daily.  Marland Kitchen  dicyclomine (BENTYL) 20 MG tablet Take 1 tablet (20 mg total) by mouth every 6 (six) hours.  . fluticasone (FLOVENT HFA) 220 MCG/ACT inhaler Inhale 2 puffs into the lungs 2 (two) times daily.  . meloxicam (MOBIC) 7.5 MG tablet Take 7.5 mg by mouth daily.  . sildenafil (REVATIO) 20 MG tablet Take 20 mg by mouth as needed (2 -5 tabs as needed).  . simvastatin (ZOCOR) 20 MG tablet TAKE 1 TABLET AT BEDTIME  . Tiotropium Bromide Monohydrate (SPIRIVA RESPIMAT) 2.5 MCG/ACT AERS Inhale 2 puffs into the lungs daily.  . [DISCONTINUED] fluticasone furoate-vilanterol (BREO ELLIPTA) 200-25 MCG/INH AEPB Inhale 1 puff into the lungs daily. (Patient not taking: Reported on 01/28/2018)  . [DISCONTINUED] predniSONE (DELTASONE) 10 MG tablet Take 40 mg daily x 3 days then take 30 mg daily x 3 days then take 20 mg daily x 3 days then take 10 mg daily x 3 days then STOP   No facility-administered encounter medications on file as of 01/28/2018.     Allergies as of 01/28/2018 - Review Complete 01/28/2018  Allergen Reaction Noted  . Codeine      Past Medical History:  Diagnosis Date  . Allergy   . Chronic lower back pain   . Herniated disc    L1  . Hypercholesteremia   . IBS (irritable bowel syndrome)    ? possible  . Prostate cancer [facility])     Past Surgical History:  Procedure Laterality Date  . CATARACT EXTRACTION    . COLONOSCOPY    . PILONIDAL CYST EXCISION  62 yrs old  . PROSTATE BIOPSY  02/11/15    Family History  Problem Relation Age of Onset  . Breast cancer Mother        survived without recurrence  . Prostate cancer Father        around age 14 -surgeyr  . Dementia Father        died from this at age 27  . Healthy Sister   . Healthy Brother   . Hemochromatosis Son        none in patient  . Down syndrome Son   . Colon cancer Neg Hx   . Esophageal cancer Neg Hx   . Rectal cancer Neg Hx   . Stomach cancer Neg Hx   . Pancreatic cancer Neg Hx     Social History   Socioeconomic  History  . Marital status: Married    Spouse name: Not on file  . Number of children: 2  . Years of education: Not on file  . Highest education level: Not on file  Occupational History  . Occupation: Buyer, retail  Social Needs  . Financial resource strain: Not on file  . Food insecurity:    Worry: Not on file    Inability: Not on file  . Transportation needs:    Medical: Not on file    Non-medical: Not on file  Tobacco Use  . Smoking status: Never Smoker  . Smokeless tobacco: Never Used  Substance and Sexual Activity  . Alcohol use: Yes    Alcohol/week: 8.4 - 9.0 oz    Types: 7 - 8 Glasses of wine, 7 Standard drinks or equivalent per week    Comment: occ  . Drug use: No  . Sexual activity: Yes  Lifestyle  . Physical activity:    Days per week: Not on file    Minutes per session: Not on file  . Stress: Not on file  Relationships  . Social connections:    Talks on phone: Not on file    Gets together: Not on file    Attends religious service: Not on file    Active member of club or organization: Not on file    Attends meetings of clubs or organizations: Not on file    Relationship status: Not on file  . Intimate partner violence:    Fear of current or ex partner: Not on file    Emotionally abused: Not on file    Physically abused: Not on file    Forced sexual activity: Not on file  Other Topics Concern  . Not on file  Social History Narrative   Married 54. Son with downs syndrome and daughter is deaf.       Works for [employer] from home.       Hobbies: enjoys walking/exercise, painting, gardening.    Review of systems: Review of Systems  Constitutional: Negative for fever and chills.  HENT: Negative.   Eyes: Negative for blurred vision.  Respiratory: as per HPI  Cardiovascular: Negative for chest pain and palpitations.  Gastrointestinal: Negative for vomiting, diarrhea, blood per rectum. Genitourinary: Negative for dysuria, urgency,  frequency and hematuria.  Musculoskeletal: Negative for myalgias, back pain and joint pain.  Skin: Negative for itching and rash.  Neurological: Negative for dizziness, tremors, focal weakness, seizures and loss of consciousness.  Endo/Heme/Allergies: Negative for environmental allergies.  Psychiatric/Behavioral: Negative for depression, suicidal ideas and hallucinations.  All other systems reviewed and are negative.  Physical Exam: Blood pressure 126/72, pulse  68, height 6\' 2"  (1.88 m), weight 197 lb (89.4 kg), SpO2 95 %. Gen:      No acute distress HEENT:  EOMI, sclera anicteric Neck:     No masses; no thyromegaly Lungs:    Clear to auscultation bilaterally; normal respiratory effort CV:         Regular rate and rhythm; no murmurs Abd:      + bowel sounds; soft, non-tender; no palpable masses, no distension Ext:    No edema; adequate peripheral perfusion Skin:      Warm and dry; no rash Neuro: alert and oriented x 3 Psych: normal mood and affect  Data Reviewed: FENO  07/25/17- 54 08/27/2017-85 11/22/2017-99 11/28/2017-157  Chest x-ray 05/24/17--Clear lungs, no acute abnormality.  I have reviewed the images personally.  CBC 12/14/14-WBC count 6.6, absolute eosinophil count 400  CBC 07/25/17-WBC count 9.1, absolute eosinophil count 500 Blood allergy profile 07/25/17-IgE 58, RAST panel is negative.  PFTs 08/27/17 FVC 6.18 [117%], FEV1 5.09 [128%], F/F 82, TLC 112%, DLCO 94% Normal study.  Assessment:  Severe persistent asthma Symptoms are consistent with asthma with elevated FENO and prior CBCs showing eosinophilia. PFTs reviewed which do not show any obstruction.  There is improvement in mid flow rates post albuterol indicating small airways disease  FENO continues to be elevated.  I have asked him to start using the Spiriva and Breo together on a regular basis He has required multiple rounds of prednisone over the past few months with of control of symptoms.  Will start him on  immunotherapy with Dupixent Start paperwork to get insurance approval. I will give him another prednisone taper starting at 40 mg.  Advised him to reduce dose by 10 mg every 3 days until he reaches a stable dose of 20 mg We will continue at this dose of prednisone until we can get him started on the biologic.  Plan/Recommendations: - Surveyor, mining, Spiriva - Start paperwork for dupixent - Prednisone taper starting at 40 mg.  Reduce dose by 10 mg every 3 days until you reach a dose of 20 mg  Follow up in 1 month.  [name] MD [facility] 01/28/2018, 3:19 PM  CC: [name], MD

## 2018-01-28 NOTE — Telephone Encounter (Signed)
Patient accepted 3pm appt today with Dr. Vaughan Browner. Placed him on the schedule -pt needed nothing further -pr

## 2018-01-28 NOTE — Patient Instructions (Signed)
We will start you on prednisone again.  Start at 40 mg.  Reduce dose by 10 mg every 3 days until you reach a dose of 20 mg.  Maintain at that dose until he can be reassessed in the clinic We will recheck some labs including CBC with differential, blood allergy profile We will start paperwork for Dupixent therapy Start using the [facility] and the Spiriva together.  We will send in prescriptions for these inhalers I will see her back in clinic in 1 month.

## 2018-01-29 LAB — RESPIRATORY ALLERGY PROFILE REGION II ~~LOC~~

## 2018-01-29 LAB — INTERPRETATION:

## 2018-02-04 ENCOUNTER — Encounter: Payer: Self-pay | Admitting: Pulmonary Disease

## 2018-02-04 NOTE — Telephone Encounter (Signed)
Mr. Loeza  The NO test was elevated in office indicating airway inflammation. The blood allergy test is negative, same as it was before.   [name] MD [facility] 02/04/2018, 3:26 PM

## 2018-02-04 NOTE — Telephone Encounter (Signed)
Dr. Vaughan Browner, pt is requesting the results of his allergy blood test. Please advise. Thanks.

## 2018-02-15 ENCOUNTER — Encounter: Payer: Self-pay | Admitting: Pulmonary Disease

## 2018-02-17 NOTE — Telephone Encounter (Signed)
Dr. Mannam - please advise. Thanks. 

## 2018-02-17 NOTE — Telephone Encounter (Signed)
Hi Mr Go,  Yes. It would be ok to see you later in the month if your symptoms are under control. We will schedule you for visit later this month.  [name] MD [place] 02/17/2018, 9:04 AM

## 2018-02-21 ENCOUNTER — Ambulatory Visit: Payer: 59 | Admitting: Pulmonary Disease

## 2018-02-26 ENCOUNTER — Telehealth: Payer: Self-pay | Admitting: Pulmonary Disease

## 2018-02-26 NOTE — Telephone Encounter (Signed)
I faxed pt's [place] paperwork to [facility] my way. It's been afew wks. so I called them and they claimed they didn't receive the first pg.. I sent it to them 02/25/18. I didn't receive a ph. Call or a fax letting me know the first pg was missing.

## 2018-02-27 ENCOUNTER — Ambulatory Visit: Payer: 59 | Admitting: Pulmonary Disease

## 2018-02-27 ENCOUNTER — Encounter: Payer: Self-pay | Admitting: Pulmonary Disease

## 2018-02-27 VITALS — BP 120/66 | HR 67 | Ht 74.0 in | Wt 203.0 lb

## 2018-02-27 DIAGNOSIS — J4551 Severe persistent asthma with (acute) exacerbation: Secondary | ICD-10-CM | POA: Insufficient documentation

## 2018-02-27 DIAGNOSIS — J4550 Severe persistent asthma, uncomplicated: Secondary | ICD-10-CM

## 2018-02-27 DIAGNOSIS — J4530 Mild persistent asthma, uncomplicated: Secondary | ICD-10-CM

## 2018-02-27 LAB — NITRIC OXIDE: Nitric Oxide: 55

## 2018-02-27 NOTE — Patient Instructions (Signed)
I am glad your breathing is better Continue the Breo and Spiriva Follow-up in 3 months Please call us sooner if there is any worsening of your symptoms.

## 2018-02-27 NOTE — Progress Notes (Signed)
Edward Lee    [id number]    05-Feb-1956  Primary Care Physician:Hunter, Brayton Mars, MD  Referring Physician: [name], MD [place], [place] [address]  Chief complaint:  Follow up for asthma  HPI: 62 year old with history of allergies, hyperlipidemia, irritable bowel syndrome, prostate cancer.  Complains of increasing dyspnea since September 2018 after return from travel to [place].  Symptoms associated with chest congestion, wheezing mostly at night, nonproductive cough.  Denies any sputum production, fevers, chills.  He was treated with several rounds of prednisone which improves symptoms temporarily.  Started on Flovent in December 2018 by Dr. Yong Channel, primary care.  He is also on albuterol as needed.  Feels that the symptoms are improved but not completely resolved He has history of seasonal allergies, denies acid reflux.  There is a strong history of asthma in the family.    Pets: Dogs, no cats, birds Occupation: Works for the [employer].  Works from home Exposures: No known exposures.  No mold at home.  Hardwood, carpet at home.  Forced air heating Smoking history: Never smoker  Travel History: Traveled to [place], [place] recently.  No other significant travel.  Interim History: Given a prednisone taper at last visit due to exacerbation He got off prednisone 4 days ago.  States that breathing is doing well Continues on [place].  He is hardly needing to use his rescue inhaler.  Outpatient Encounter Medications as of 02/27/2018  Medication Sig  . albuterol (PROVENTIL HFA;VENTOLIN HFA) 108 (90 Base) MCG/ACT inhaler Inhale 2 puffs into the lungs every 6 (six) hours as needed for wheezing or shortness of breath.  . citalopram (CELEXA) 40 MG tablet Take 1 tablet (40 mg total) by mouth daily.  Marland Kitchen dicyclomine (BENTYL) 20 MG tablet Take 1 tablet (20 mg total) by mouth every 6 (six) hours.  . fluticasone (FLOVENT HFA) 220 MCG/ACT  inhaler Inhale 2 puffs into the lungs 2 (two) times daily.  . meloxicam (MOBIC) 7.5 MG tablet Take 7.5 mg by mouth daily.  . sildenafil (REVATIO) 20 MG tablet Take 20 mg by mouth as needed (2 -5 tabs as needed).  . simvastatin (ZOCOR) 20 MG tablet TAKE 1 TABLET AT BEDTIME  . Tiotropium Bromide Monohydrate (SPIRIVA RESPIMAT) 2.5 MCG/ACT AERS Inhale 2 puffs into the lungs daily.  . [DISCONTINUED] predniSONE (DELTASONE) 10 MG tablet Take 40mg x3days,30mg x3days,then continue on 20mg  daily   No facility-administered encounter medications on file as of 02/27/2018.     Allergies as of 02/27/2018 - Review Complete 02/27/2018  Allergen Reaction Noted  . Codeine      Past Medical History:  Diagnosis Date  . Allergy   . Chronic lower back pain   . Herniated disc    L1  . Hypercholesteremia   . IBS (irritable bowel syndrome)    ? possible  . Prostate cancer [facility])     Past Surgical History:  Procedure Laterality Date  . CATARACT EXTRACTION    . COLONOSCOPY    . PILONIDAL CYST EXCISION  62 yrs old  . PROSTATE BIOPSY  02/11/15    Family History  Problem Relation Age of Onset  . Breast cancer Mother        survived without recurrence  . Prostate cancer Father        around age 69 -surgeyr  . Dementia Father        died from this at age 56  . Healthy  Sister   . Healthy Brother   . Hemochromatosis Son        none in patient  . Down syndrome Son   . Colon cancer Neg Hx   . Esophageal cancer Neg Hx   . Rectal cancer Neg Hx   . Stomach cancer Neg Hx   . Pancreatic cancer Neg Hx     Social History   Socioeconomic History  . Marital status: Married    Spouse name: Not on file  . Number of children: 2  . Years of education: Not on file  . Highest education level: Not on file  Occupational History  . Occupation: Buyer, retail  Social Needs  . Financial resource strain: Not on file  . Food insecurity:    Worry: Not on file    Inability: Not on file  . Transportation  needs:    Medical: Not on file    Non-medical: Not on file  Tobacco Use  . Smoking status: Never Smoker  . Smokeless tobacco: Never Used  Substance and Sexual Activity  . Alcohol use: Yes    Alcohol/week: 14.0 - 15.0 standard drinks    Types: 7 - 8 Glasses of wine, 7 Standard drinks or equivalent per week    Comment: occ  . Drug use: No  . Sexual activity: Yes  Lifestyle  . Physical activity:    Days per week: Not on file    Minutes per session: Not on file  . Stress: Not on file  Relationships  . Social connections:    Talks on phone: Not on file    Gets together: Not on file    Attends religious service: Not on file    Active member of club or organization: Not on file    Attends meetings of clubs or organizations: Not on file    Relationship status: Not on file  . Intimate partner violence:    Fear of current or ex partner: Not on file    Emotionally abused: Not on file    Physically abused: Not on file    Forced sexual activity: Not on file  Other Topics Concern  . Not on file  Social History Narrative   Married 45. Son with downs syndrome and daughter is deaf.       Works for [employer] from home.       Hobbies: enjoys walking/exercise, painting, gardening.    Review of systems: Review of Systems  Constitutional: Negative for fever and chills.  HENT: Negative.   Eyes: Negative for blurred vision.  Respiratory: as per HPI  Cardiovascular: Negative for chest pain and palpitations.  Gastrointestinal: Negative for vomiting, diarrhea, blood per rectum. Genitourinary: Negative for dysuria, urgency, frequency and hematuria.  Musculoskeletal: Negative for myalgias, back pain and joint pain.  Skin: Negative for itching and rash.  Neurological: Negative for dizziness, tremors, focal weakness, seizures and loss of consciousness.  Endo/Heme/Allergies: Negative for environmental allergies.  Psychiatric/Behavioral: Negative for depression, suicidal ideas and  hallucinations.  All other systems reviewed and are negative.  Physical Exam: Blood pressure 120/66, pulse 67, height 6\' 2"  (1.88 m), weight 203 lb (92.1 kg), SpO2 96 %. Gen:      No acute distress HEENT:  EOMI, sclera anicteric Neck:     No masses; no thyromegaly Lungs:    Clear to auscultation bilaterally; normal respiratory effort CV:         Regular rate and rhythm; no murmurs Abd:      +  bowel sounds; soft, non-tender; no palpable masses, no distension Ext:    No edema; adequate peripheral perfusion Skin:      Warm and dry; no rash Neuro: alert and oriented x 3 Psych: normal mood and affect  Data Reviewed: FENO  07/25/17- 54 08/27/2017-85 11/22/2017-99 11/28/2017-157 02/27/2018-55  Chest x-ray 05/24/17--Clear lungs, no acute abnormality.  I have reviewed the images personally.  CBC 12/14/14-WBC count 6.6, absolute eosinophil count 400  CBC 07/25/17-WBC count 9.1, absolute eosinophil count 500 Blood allergy profile 07/25/17-IgE 58, RAST panel is negative.  PFTs 08/27/17 FVC 6.18 [117%], FEV1 5.09 [128%], F/F 82, TLC 112%, DLCO 94% Normal study.  Assessment:  Severe persistent asthma Symptoms are consistent with asthma with elevated FENO and CBCs showing eosinophilia. PFTs reviewed which do not show any obstruction.  There is improvement in mid flow rates post albuterol indicating small airways disease  Continues on Breo and Spiriva. Symptoms have improved after prednisone taper We had a discussion about starting immunotherapy with Dupixent.  He would like to hold off for now If he continues to require frequent prednisone tapers then we can revisit this issue.  Plan/Recommendations: - Continue Breo, Spiriva  Follow-up in 3 months.  [name] MD [facility] 02/27/2018, 4:18 PM  CC: [name], MD

## 2018-02-28 NOTE — Telephone Encounter (Addendum)
Received fax from [facility] my way. They received the fax sent on 02/25/18. Pt. Was approved but has decided to hold off on Dupixent right now. Per his and Mannam's discussion at his last ov. (02/27/18) Nothing further needed.

## 2018-03-12 MED ORDER — TIOTROPIUM BROMIDE MONOHYDRATE 2.5 MCG/ACT IN AERS: 2.0000 | INHALATION_SPRAY | Freq: Every day | RESPIRATORY_TRACT | 0 refills | Status: DC

## 2018-03-25 MED ORDER — TIOTROPIUM BROMIDE MONOHYDRATE 2.5 MCG/ACT IN AERS: 2.0000 | INHALATION_SPRAY | Freq: Every day | RESPIRATORY_TRACT | 3 refills | Status: DC

## 2018-04-02 ENCOUNTER — Telehealth: Payer: Self-pay | Admitting: Family Medicine

## 2018-04-02 ENCOUNTER — Encounter: Payer: Self-pay | Admitting: Family Medicine

## 2018-04-02 ENCOUNTER — Ambulatory Visit: Payer: 59 | Admitting: Family Medicine

## 2018-04-02 VITALS — BP 98/74 | HR 54 | Temp 98.7°F | Ht 74.0 in | Wt 201.4 lb

## 2018-04-02 DIAGNOSIS — J329 Chronic sinusitis, unspecified: Secondary | ICD-10-CM | POA: Diagnosis not present

## 2018-04-02 DIAGNOSIS — B9689 Other specified bacterial agents as the cause of diseases classified elsewhere: Secondary | ICD-10-CM

## 2018-04-02 DIAGNOSIS — J4551 Severe persistent asthma with (acute) exacerbation: Secondary | ICD-10-CM | POA: Diagnosis not present

## 2018-04-02 MED ORDER — PREDNISONE 50 MG PO TABS: 50.0000 mg | ORAL_TABLET | Freq: Every day | ORAL | 0 refills | Status: DC

## 2018-04-02 MED ORDER — AMOXICILLIN-POT CLAVULANATE 875-125 MG PO TABS: 1.0000 | ORAL_TABLET | Freq: Two times a day (BID) | ORAL | 0 refills | Status: AC

## 2018-04-02 NOTE — Patient Instructions (Addendum)
Sinsusitis Bacterial based on: Symptoms >10 days. IN addition having wheezing and shortness of breath concerning for asthma flare  Treatment: -considered steroid: we opted in- to help with sinuses and asthma -Antibiotic indicated: Augmentin for 7 days  Finally, we reviewed reasons to return to care including if symptoms worsen or persist or new concerns arise (particularly increasing fever or failure of fever to resolve or  Worsening shortness of breath). If this hasnt cleared by end of 7 days- would be worth sitting down with pulmonary for their opinion.   Meds ordered this encounter  Medications  . predniSONE (DELTASONE) 50 MG tablet    Sig: Take 1 tablet (50 mg total) by mouth daily with breakfast.    Dispense:  7 tablet    Refill:  0  . amoxicillin-clavulanate (AUGMENTIN) 875-125 MG tablet    Sig: Take 1 tablet by mouth 2 (two) times daily for 7 days.    Dispense:  14 tablet    Refill:  0

## 2018-04-02 NOTE — Telephone Encounter (Signed)
Please see message. °

## 2018-04-02 NOTE — Progress Notes (Signed)
PCP: [name], MD  Subjective:  Edward Lee is a 62 y.o. year old very pleasant male patient who presents with sinusitis symptoms including nasal congestion, sinus tenderness, wheezing, shortness of breath  Flew 2 weeks ago and picked up a cold Cold symptoms for 5 days- nasal congestion, coughing. Usually his symptoms resolve after 3-5 days but has persisted for 2 weeks. Either stuffed up sneezing and coughing or clear. Having some low grade temperatures - to low 100s. Sputum is yellow. Discharge from nose from clear. Some joint aches last night but otday feels fine. Raspy voice. Some sinus pressure. Feeling more winded, wheezing more. Upper chest irritation with this- mild feeling of congestion  ROS-denies fever, SOB, NVD, tooth pain  Pertinent Past Medical History-  Patient Active Problem List   Diagnosis Date Noted  . Malignant neoplasm of prostate ([place]) 04/26/2015    Priority: High  . Chronic low back pain 01/10/2017    Priority: Medium  . Urinary frequency 11/28/2011    Priority: Medium  . Hyperlipidemia 03/21/2011    Priority: Medium  . PTSD (post-traumatic stress disorder) 09/06/2010    Priority: Medium  . IBS (irritable bowel syndrome)     Priority: Low  . Severe persistent asthma with acute exacerbation 02/27/2018  . Mild persistent asthma without complication 27/74/1287    Medications- reviewed  Current Outpatient Medications  Medication Sig Dispense Refill  . albuterol (PROVENTIL HFA;VENTOLIN HFA) 108 (90 Base) MCG/ACT inhaler Inhale 2 puffs into the lungs every 6 (six) hours as needed for wheezing or shortness of breath. 1 Inhaler 2  . citalopram (CELEXA) 40 MG tablet Take 1 tablet (40 mg total) by mouth daily. 90 tablet 3  . dicyclomine (BENTYL) 20 MG tablet Take 1 tablet (20 mg total) by mouth every 6 (six) hours. 60 tablet 1  . fluticasone furoate-vilanterol (BREO ELLIPTA) 200-25 MCG/INH AEPB Inhale 1 puff into the lungs daily.    . meloxicam (MOBIC) 7.5  MG tablet Take 7.5 mg by mouth daily.    . sildenafil (REVATIO) 20 MG tablet Take 20 mg by mouth as needed (2 -5 tabs as needed).    . simvastatin (ZOCOR) 20 MG tablet TAKE 1 TABLET AT BEDTIME 90 tablet 1  . Tiotropium Bromide Monohydrate (SPIRIVA RESPIMAT) 2.5 MCG/ACT AERS Inhale 2 puffs into the lungs daily. 3 Inhaler 3  . amoxicillin-clavulanate (AUGMENTIN) 875-125 MG tablet Take 1 tablet by mouth 2 (two) times daily for 7 days. 14 tablet 0  . predniSONE (DELTASONE) 50 MG tablet Take 1 tablet (50 mg total) by mouth daily with breakfast. 7 tablet 0   No current facility-administered medications for this visit.     Objective: BP 98/74 (BP Location: Left Arm, Patient Position: Sitting, Cuff Size: Large)   Pulse (!) 54   Temp 98.7 F (37.1 C) (Oral)   Ht 6\' 2"  (1.88 m)   Wt 201 lb 6.4 oz (91.4 kg)   SpO2 96%   BMI 25.86 kg/m  Gen: NAD, resting comfortably HEENT: Turbinates erythematous with clear and yellow drainage, TM normal, pharynx mildly erythematous with no tonsilar exudate or edema, left maxillary sinus tenderness CV: RRR no murmurs rubs or gallops Lungs: CTAB no crackles, wheeze, rhonchi Ext: no edema Skin: warm, dry, no rash  Assessment/Plan:  Sinsusitis Bacterial based on: Symptoms >10 days. IN addition having wheezing and shortness of breath concerning for asthma flare. Lungs were clear at the moment, doubt bronchitis or pneumonia though possible- we also strongly considered x-ray but opted  to pursue this if not improving with above treatment.   Treatment: -considered steroid: we opted in- to help with sinuses and asthma -Antibiotic indicated: Augmentin for 7 days. No penicillin allergy listed- but we got a call from pharmacy about this allergy- we are going to check with patient about this- consider change to doxycycline if has true allergy.   Finally, we reviewed reasons to return to care including if symptoms worsen or persist or new concerns arise (particularly  increasing fever or failure of fever to resolve or  Worsening shortness of breath). If this hasnt cleared by end of 7 days- would be worth sitting down with pulmonary for their opinion.   Meds ordered this encounter  Medications  . predniSONE (DELTASONE) 50 MG tablet    Sig: Take 1 tablet (50 mg total) by mouth daily with breakfast.    Dispense:  7 tablet    Refill:  0  . amoxicillin-clavulanate (AUGMENTIN) 875-125 MG tablet    Sig: Take 1 tablet by mouth 2 (two) times daily for 7 days.    Dispense:  14 tablet    Refill:  0    [name], MD

## 2018-04-02 NOTE — Telephone Encounter (Signed)
Spoke to pt told him received call from pharmacy regarding the antibiotic Dr. Yong Channel ordered cause they said you are allergic to penicillin but we only have you are allergic to codeine. Pt said yes he is only allergic to codeine that his son is allergic to Penicillin and they must have him mixed up.Told him okay I will call the pharmacy and let them know to fill the Rx. Pt verbalized understanding and said if I have any trouble to call him back.   [place] and spoke to [place], told her pt is only allergic to codeine not penicillin and that I clarified that with the pt. Angie verbalized understanding and will fill Rx for Augmentin and remove penicillin allergy from pt's chart. Told her okay, thanks.

## 2018-04-02 NOTE — Telephone Encounter (Signed)
We only have codeine listed- please talk with patient. He can go ahead and start the prednisone and I will try to get back to him tomorrow if really is allergic with other option.

## 2018-04-02 NOTE — Telephone Encounter (Signed)
Copied from [place] [id number]. Topic: General - Other >> Apr 02, 2018 12:28 PM Oneta Rack wrote: [place], [place] [phone] (Phone)  [phone] (Fax)   Reason for call:  Pharmacy states PCP amoxicillin-clavulanate (AUGMENTIN) 875-125 MG tablet was prescribed today, pharmacist states patient is allergic to penicillin, please advise

## 2018-04-21 ENCOUNTER — Other Ambulatory Visit: Payer: Self-pay

## 2018-04-21 ENCOUNTER — Encounter: Payer: Self-pay | Admitting: Family Medicine

## 2018-04-21 MED ORDER — CITALOPRAM HYDROBROMIDE 40 MG PO TABS: 40.0000 mg | ORAL_TABLET | Freq: Every day | ORAL | 3 refills | Status: DC

## 2018-05-05 ENCOUNTER — Other Ambulatory Visit: Payer: Self-pay | Admitting: Urology

## 2018-05-05 DIAGNOSIS — C61 Malignant neoplasm of prostate: Secondary | ICD-10-CM

## 2018-05-14 ENCOUNTER — Encounter: Payer: Self-pay | Admitting: Family Medicine

## 2018-05-30 ENCOUNTER — Ambulatory Visit: Payer: 59 | Admitting: Pulmonary Disease

## 2018-06-09 ENCOUNTER — Other Ambulatory Visit: Payer: Self-pay | Admitting: Family Medicine

## 2018-06-17 ENCOUNTER — Encounter: Payer: Self-pay | Admitting: Family Medicine

## 2018-06-17 ENCOUNTER — Ambulatory Visit (INDEPENDENT_AMBULATORY_CARE_PROVIDER_SITE_OTHER): Payer: 59 | Admitting: Family Medicine

## 2018-06-17 VITALS — BP 100/70 | HR 70 | Temp 98.4°F | Ht 74.0 in | Wt 201.0 lb

## 2018-06-17 DIAGNOSIS — B9689 Other specified bacterial agents as the cause of diseases classified elsewhere: Secondary | ICD-10-CM

## 2018-06-17 DIAGNOSIS — Z23 Encounter for immunization: Secondary | ICD-10-CM

## 2018-06-17 DIAGNOSIS — J4530 Mild persistent asthma, uncomplicated: Secondary | ICD-10-CM

## 2018-06-17 DIAGNOSIS — J329 Chronic sinusitis, unspecified: Secondary | ICD-10-CM | POA: Diagnosis not present

## 2018-06-17 MED ORDER — PREDNISONE 50 MG PO TABS: 50.0000 mg | ORAL_TABLET | Freq: Every day | ORAL | 0 refills | Status: DC

## 2018-06-17 MED ORDER — AMOXICILLIN-POT CLAVULANATE 875-125 MG PO TABS: 1.0000 | ORAL_TABLET | Freq: Two times a day (BID) | ORAL | 0 refills | Status: DC

## 2018-06-17 NOTE — Progress Notes (Signed)
PCP: [name], MD  Subjective:  Edward Lee is a 62 y.o. year old very pleasant male patient who presents with sinusitis symptoms including nasal congestion, sinus tenderness Prior sinusitis cleared up in September with augmentin and prednisone  Starting 6 weeks ago things started back up- particularly bad at night.  Sneezing, coughing, sinus pressure, a lot of drainage Feels congested in day but not much pressure or drainage Drainage-clear from nose, dark yellow orange from throat and thick  Seems to flare up his asthma and occasionally will get some wheeze and shortness of breath but no worsening symptoms.No fever. Some wheezing and shortness of breath- using breo and spiriva once a day - not having to use rescue inhaler. Has kept it stable but not bad enough to use albuterol   -Symptoms do not seem to be improving significantly lately -previous treatments: advil cold and sinus helps with sleep but has been using for 2 week straight -sick contacts/travel/risks: denies flu exposure.   ROS-denies fever, SOB, NVD, tooth pain.  Does have sinus tenderness and fullness  Pertinent Past Medical History-  Patient Active Problem List   Diagnosis Date Noted  . Malignant neoplasm of prostate ([place]) 04/26/2015    Priority: High  . Chronic low back pain 01/10/2017    Priority: Medium  . Urinary frequency 11/28/2011    Priority: Medium  . Hyperlipidemia 03/21/2011    Priority: Medium  . PTSD (post-traumatic stress disorder) 09/06/2010    Priority: Medium  . IBS (irritable bowel syndrome)     Priority: Low  . Severe persistent asthma with acute exacerbation 02/27/2018  . Mild persistent asthma without complication 28/36/6294    Medications- reviewed  Current Outpatient Medications  Medication Sig Dispense Refill  . albuterol (PROVENTIL HFA;VENTOLIN HFA) 108 (90 Base) MCG/ACT inhaler Inhale 2 puffs into the lungs every 6 (six) hours as needed for wheezing or shortness of breath.  1 Inhaler 2  . citalopram (CELEXA) 40 MG tablet Take 1 tablet (40 mg total) by mouth daily. 90 tablet 3  . dicyclomine (BENTYL) 20 MG tablet Take 1 tablet (20 mg total) by mouth every 6 (six) hours. 60 tablet 1  . fluticasone furoate-vilanterol (BREO ELLIPTA) 200-25 MCG/INH AEPB Inhale 1 puff into the lungs daily.    . meloxicam (MOBIC) 7.5 MG tablet Take 7.5 mg by mouth daily.    . predniSONE (DELTASONE) 50 MG tablet Take 1 tablet (50 mg total) by mouth daily with breakfast. 7 tablet 0  . sildenafil (REVATIO) 20 MG tablet Take 20 mg by mouth as needed (2 -5 tabs as needed).    . simvastatin (ZOCOR) 20 MG tablet TAKE 1 TABLET AT BEDTIME 90 tablet 2  . Tiotropium Bromide Monohydrate (SPIRIVA RESPIMAT) 2.5 MCG/ACT AERS Inhale 2 puffs into the lungs daily. 3 Inhaler 3   No current facility-administered medications for this visit.     Objective: BP 100/70 (BP Location: Left Arm, Patient Position: Sitting, Cuff Size: Large)   Pulse 70   Temp 98.4 F (36.9 C) (Oral)   Ht 6\' 2"  (1.88 m)   Wt 201 lb (91.2 kg)   SpO2 95%   BMI 25.81 kg/m  Gen: NAD, resting comfortably HEENT: Turbinates very erythematous with some yellow and clear drainage, TM normal, pharynx mildly erythematous with no tonsilar exudate or edema, bilateral maxillary sinus tenderness CV: RRR no murmurs rubs or gallops Lungs: CTAB no crackles, wheeze, rhonchi Ext: no edema Skin: warm, dry, no rash Neuro: grossly normal, moves all extremities  Assessment/Plan:  Sinsusitis/bacterial sinus infection Bacterial based on: Symptoms >10 days (actually 6 weeks!) Of note-asthma does not seem to be an acute exacerbation but overall seems to have poor control-hopeful this improves with clearance of sinusitis-patient moved back to pulmonary appointment has wanted to have current symptoms cleared before evaluation  Treatment: -considered steroid: we opted in- prednisone seemed to help last time -other symptomatic care with trial of  claritin daily in case allergic element as well with his sneezing, watery, itchy eyes.  I would consider continuing Claritin in the morning even after improvement of current symptoms -Antibiotic indicated: yes- 10 days of augmentin planned  Finally, we reviewed reasons to return to care including if symptoms worsen or persist or new concerns arise (particularly fever or shortness of breath) or if symptoms fail to improve with treatment  Meds ordered this encounter  Medications  . amoxicillin-clavulanate (AUGMENTIN) 875-125 MG tablet    Sig: Take 1 tablet by mouth 2 (two) times daily.    Dispense:  20 tablet    Refill:  0  . predniSONE (DELTASONE) 50 MG tablet    Sig: Take 1 tablet (50 mg total) by mouth daily with breakfast.    Dispense:  7 tablet    Refill:  0   [name], MD

## 2018-06-17 NOTE — Patient Instructions (Signed)
Sinsusitis/bacterial sinus infection Bacterial based on: Symptoms >10 days (actually 6 weeks!)  Treatment: -considered steroid: we opted in- prednisone seemed to help last time -other symptomatic care with trial of claritin daily in case allergic element as well with his sneezing, watery, itchy eyes.  I would consider continuing Claritin in the morning even after improvement of current symptoms -Antibiotic indicated: yes- 10 days of augmentin planned  Finally, we reviewed reasons to return to care including if symptoms worsen or persist or new concerns arise (particularly fever or shortness of breath) or if symptoms fail to improve with treatment

## 2018-06-20 ENCOUNTER — Ambulatory Visit: Payer: 59 | Admitting: Pulmonary Disease

## 2018-06-23 ENCOUNTER — Other Ambulatory Visit: Payer: 59

## 2018-07-14 LAB — PSA: PSA: 0.68

## 2018-07-17 ENCOUNTER — Encounter: Payer: Self-pay | Admitting: Family Medicine

## 2018-07-24 ENCOUNTER — Encounter: Payer: Self-pay | Admitting: Family Medicine

## 2018-07-25 ENCOUNTER — Encounter: Payer: Self-pay | Admitting: Family Medicine

## 2018-07-25 ENCOUNTER — Ambulatory Visit: Payer: 59 | Admitting: Family Medicine

## 2018-07-25 VITALS — BP 118/74 | HR 53 | Temp 97.4°F | Ht 74.0 in | Wt 202.6 lb

## 2018-07-25 DIAGNOSIS — J329 Chronic sinusitis, unspecified: Secondary | ICD-10-CM

## 2018-07-25 DIAGNOSIS — J4541 Moderate persistent asthma with (acute) exacerbation: Secondary | ICD-10-CM

## 2018-07-25 DIAGNOSIS — C61 Malignant neoplasm of prostate: Secondary | ICD-10-CM

## 2018-07-25 DIAGNOSIS — B9689 Other specified bacterial agents as the cause of diseases classified elsewhere: Secondary | ICD-10-CM | POA: Diagnosis not present

## 2018-07-25 MED ORDER — DOXYCYCLINE HYCLATE 100 MG PO TABS: 100.0000 mg | ORAL_TABLET | Freq: Two times a day (BID) | ORAL | 0 refills | Status: DC

## 2018-07-25 MED ORDER — PREDNISONE 20 MG PO TABS: ORAL_TABLET | ORAL | 0 refills | Status: DC

## 2018-07-25 NOTE — Patient Instructions (Addendum)
Asthma flare up. Sinus flare up/sinusitis.   Treat with doxycycline antibiotic for 10 days  Treat with prednisone for 7 days  If not improved or worsening or recurrence- please see [place] back

## 2018-07-25 NOTE — Progress Notes (Signed)
Subjective:  Edward Lee is a 63 y.o. year old very pleasant male patient who presents for/with See problem oriented charting ROS-patient has had some shortness of breath and wheezing.  No chest pain.  Has had significant sinus pressure and congestion  Past Medical History-  Patient Active Problem List   Diagnosis Date Noted  . Malignant neoplasm of prostate ([place]) 04/26/2015    Priority: High  . Chronic low back pain 01/10/2017    Priority: Medium  . Urinary frequency 11/28/2011    Priority: Medium  . Hyperlipidemia 03/21/2011    Priority: Medium  . PTSD (post-traumatic stress disorder) 09/06/2010    Priority: Medium  . IBS (irritable bowel syndrome)     Priority: Low  . Severe persistent asthma with acute exacerbation 02/27/2018  . Mild persistent asthma without complication 15/40/0867    Medications- reviewed and updated Current Outpatient Medications  Medication Sig Dispense Refill  . albuterol (PROVENTIL HFA;VENTOLIN HFA) 108 (90 Base) MCG/ACT inhaler Inhale 2 puffs into the lungs every 6 (six) hours as needed for wheezing or shortness of breath. 1 Inhaler 2  . citalopram (CELEXA) 40 MG tablet Take 1 tablet (40 mg total) by mouth daily. 90 tablet 3  . dicyclomine (BENTYL) 20 MG tablet Take 1 tablet (20 mg total) by mouth every 6 (six) hours. 60 tablet 1  . fluticasone furoate-vilanterol (BREO ELLIPTA) 200-25 MCG/INH AEPB Inhale 1 puff into the lungs daily.    . meloxicam (MOBIC) 7.5 MG tablet Take 7.5 mg by mouth daily.    . sildenafil (REVATIO) 20 MG tablet Take 20 mg by mouth as needed (2 -5 tabs as needed).    . simvastatin (ZOCOR) 20 MG tablet TAKE 1 TABLET AT BEDTIME 90 tablet 2  . Tiotropium Bromide Monohydrate (SPIRIVA RESPIMAT) 2.5 MCG/ACT AERS Inhale 2 puffs into the lungs daily. 3 Inhaler 3  . doxycycline (VIBRA-TABS) 100 MG tablet Take 1 tablet (100 mg total) by mouth 2 (two) times daily. 20 tablet 0  . predniSONE (DELTASONE) 20 MG tablet Take 2 pills for 3 days,  1 pill for 4 days 10 tablet 0   No current facility-administered medications for this visit.     Objective: BP 118/74 (BP Location: Left Arm, Patient Position: Sitting, Cuff Size: Large)   Pulse (!) 53   Temp (!) 97.4 F (36.3 C) (Oral)   Ht 6\' 2"  (1.88 m)   Wt 202 lb 9.6 oz (91.9 kg)   SpO2 98%   BMI 26.01 kg/m  Gen: NAD, appears fatigued Patient with yellow sinus discharge.  No significant sinus tenderness.  Erythematous pharynx with signs of drainage. CV: RRR no murmurs rubs or gallops Lungs: CTAB no crackles, wheeze, rhonchi Ext: no edema Skin: warm, dry  Assessment/Plan:  Moderate persistent asthma with exacerbation Bacterial sinusitis  S: Treated early December for sinusitis with 10 days of augmentin Symptoms clear for at least 2 weeks Since Christmas symptoms ave worsened again Now over 2 weeks of symptoms Heavy nasal and chest congestion significant sinus pressure at night in particular Dark sputum. Green/yellow discharge from nose- started clear now dark.  Tired/fatigued Wheezing and at times gets short of breath Seems to get worse in the evenings Takes advil cold and sinus nightly and that helps some- miserable if doesn't take it No fever with this.   Patient remains on baseline Breo and Spiriva A/P: Assessment and plan per AVS.  We discussed repeating Augmentin as he used last month but we preferred to hit this from  a slightly different angle-discussed Levaquin as well but wanted to avoid potential side effects From AVS "  Patient Instructions  Asthma flare up. Sinus flare up/sinusitis.   Treat with doxycycline antibiotic for 10 days  Treat with prednisone for 7 days  If not improved or worsening or recurrence- please see [place] back   "  Malignant neoplasm of prostate ([place]) S: Patient updates me that recent PSA was stable.  They have planned a repeat biopsy-last one was  in July 2016 A/P: Hopefully stable- continues under active surveillance under the  care of urology.     Lab/Order associations:  Meds ordered this encounter  Medications  . doxycycline (VIBRA-TABS) 100 MG tablet    Sig: Take 1 tablet (100 mg total) by mouth 2 (two) times daily.    Dispense:  20 tablet    Refill:  0  . predniSONE (DELTASONE) 20 MG tablet    Sig: Take 2 pills for 3 days, 1 pill for 4 days    Dispense:  10 tablet    Refill:  0    Return precautions advised.  [name], MD

## 2018-07-25 NOTE — Assessment & Plan Note (Signed)
S: Patient updates me that recent PSA was stable.  They have planned a repeat biopsy-last one was  in July 2016 A/P: Hopefully stable- continues under active surveillance under the care of urology.

## 2018-08-26 ENCOUNTER — Encounter: Payer: Self-pay | Admitting: Family Medicine

## 2018-08-26 ENCOUNTER — Ambulatory Visit: Payer: 59 | Admitting: Family Medicine

## 2018-08-26 VITALS — BP 120/68 | HR 91 | Temp 98.2°F | Ht 74.0 in | Wt 200.0 lb

## 2018-08-26 DIAGNOSIS — J0140 Acute pansinusitis, unspecified: Secondary | ICD-10-CM

## 2018-08-26 DIAGNOSIS — J4551 Severe persistent asthma with (acute) exacerbation: Secondary | ICD-10-CM

## 2018-08-26 MED ORDER — PREDNISONE 20 MG PO TABS: ORAL_TABLET | ORAL | 0 refills | Status: DC

## 2018-08-26 MED ORDER — ALBUTEROL SULFATE HFA 108 (90 BASE) MCG/ACT IN AERS: 2.0000 | INHALATION_SPRAY | Freq: Four times a day (QID) | RESPIRATORY_TRACT | 2 refills | Status: DC | PRN

## 2018-08-26 NOTE — Patient Instructions (Addendum)
Lets do a longer course of prednisone this time  If sinus pressure is not going in right direction by Thursday night or Friday or worsens- please let me know as I would plan to send in doxycycline 100mg  twice a day for 14 days  Just with all your flare ups- I would also encourage you to get a follow up with Dr. Vaughan Browner for his opinion if there is anything else we can do from asthma side of things

## 2018-08-26 NOTE — Progress Notes (Addendum)
Phone [phone]   Subjective:  Edward Lee is a 63 y.o. year old very pleasant male patient who presents for/with See problem oriented charting ROS-complains of sinus congestion, wheezing and chest tightness.  No chest pain reported.  Does have some shortness of breath-improved with albuterol  Past Medical History-  Patient Active Problem List   Diagnosis Date Noted  . Malignant neoplasm of prostate ([place]) 04/26/2015    Priority: High  . Chronic low back pain 01/10/2017    Priority: Medium  . Urinary frequency 11/28/2011    Priority: Medium  . Hyperlipidemia 03/21/2011    Priority: Medium  . PTSD (post-traumatic stress disorder) 09/06/2010    Priority: Medium  . IBS (irritable bowel syndrome)     Priority: Low  . Severe persistent asthma with acute exacerbation 02/27/2018  . Mild persistent asthma without complication 32/95/1884    Medications- reviewed and updated Current Outpatient Medications  Medication Sig Dispense Refill  . albuterol (PROVENTIL HFA;VENTOLIN HFA) 108 (90 Base) MCG/ACT inhaler Inhale 2 puffs into the lungs every 6 (six) hours as needed for wheezing or shortness of breath. 1 Inhaler 2  . citalopram (CELEXA) 40 MG tablet Take 1 tablet (40 mg total) by mouth daily. 90 tablet 3  . fluticasone furoate-vilanterol (BREO ELLIPTA) 200-25 MCG/INH AEPB Inhale 1 puff into the lungs daily.    . sildenafil (REVATIO) 20 MG tablet Take 20 mg by mouth as needed (2 -5 tabs as needed).    . simvastatin (ZOCOR) 20 MG tablet TAKE 1 TABLET AT BEDTIME 90 tablet 2  . Tiotropium Bromide Monohydrate (SPIRIVA RESPIMAT) 2.5 MCG/ACT AERS Inhale 2 puffs into the lungs daily. 3 Inhaler 3     Objective:  BP 120/68 (BP Location: Left Arm, Patient Position: Sitting, Cuff Size: Normal)   Pulse 91   Temp 98.2 F (36.8 C) (Oral)   Ht 6\' 2"  (1.88 m)   Wt 200 lb (90.7 kg)   SpO2 94%   BMI 25.68 kg/m  Gen: NAD, resting comfortably Erythematous/edematous nasal turbinates with  primarily clear drainage.  Pharynx with some signs of drainage. CV: RRR no murmurs rubs or gallops Lungs: Intermittent wheeze noted-otherwise no crackles or rhonchi in bilateral lung fields Ext: no edema Skin: warm, dry    Assessment and Plan   Asthma exacerbation-with baseline severe persistent asthma  Sinusitis S: saw early January for sinusitis and asthma flare up- treated 10 days of doxycycline and prednisone- felt significantly better- almost completely went away and then 2 weeks ago symptoms worsened significantly again.    Feeling intense sinus pressure and feels like cannot breath through nose as well.  A lot of nasal discharge but primarily clear.  Has unproductive cough that can get fits- can have an hour fit of coughing- only sleeping 2-3 hours a night. Raspy voice. Having chest tightness and wheezing with it. On spiriva and breo- having to use rescue inhaler much more. taking 1-2 advil sinus per day. Has decongestant  A/P: Poor control of asthma despite continued Brio and Spiriva-we will add an extended course of prednisone.  This may also benefit sinusitis-with many rounds of recent antibiotics he wants to hold off unless he is not improving by the end of the week - not interested in quinolones due to potential side effects -doxycycline-tolerated  better than Augmentin -Would do 14-day course of doxycycline if needed -Given number of flareups of asthma recently I recommended follow-up with Dr. Daylene Posey agrees to call to schedule  Addendum: Patient calls in and  wants to have doxycycline on hand in case worsening over weekend but doing ok on prednisone- improving. Will print rx for him to have.   Meds ordered this encounter  Medications  . albuterol (PROVENTIL HFA;VENTOLIN HFA) 108 (90 Base) MCG/ACT inhaler    Sig: Inhale 2 puffs into the lungs every 6 (six) hours as needed for wheezing or shortness of breath.    Dispense:  1 Inhaler    Refill:  2  . predniSONE  (DELTASONE) 20 MG tablet    Sig: Take 2 pills for 3 days, 1 pill for 3 days, 1/2 pill for 3 days, 1/2 pill every other day until finished    Dispense:  12 tablet    Refill:  0   Return precautions advised.  [name], MD

## 2018-08-28 ENCOUNTER — Encounter: Payer: Self-pay | Admitting: Family Medicine

## 2018-08-28 MED ORDER — DOXYCYCLINE HYCLATE 100 MG PO TABS: 100.0000 mg | ORAL_TABLET | Freq: Two times a day (BID) | ORAL | 0 refills | Status: DC

## 2018-08-28 NOTE — Addendum Note (Signed)
Addended by: [name] on: 08/28/2018 02:29 PM   Modules accepted: Orders

## 2018-08-29 ENCOUNTER — Ambulatory Visit: Payer: 59 | Admitting: Family Medicine

## 2018-09-03 ENCOUNTER — Encounter: Payer: Self-pay | Admitting: Pulmonary Disease

## 2018-09-03 ENCOUNTER — Ambulatory Visit: Payer: 59 | Admitting: Pulmonary Disease

## 2018-09-03 VITALS — BP 122/70 | HR 78 | Ht 74.0 in | Wt 202.2 lb

## 2018-09-03 DIAGNOSIS — J4550 Severe persistent asthma, uncomplicated: Secondary | ICD-10-CM | POA: Diagnosis not present

## 2018-09-03 MED ORDER — TIOTROPIUM BROMIDE MONOHYDRATE 2.5 MCG/ACT IN AERS: 2.0000 | INHALATION_SPRAY | Freq: Every day | RESPIRATORY_TRACT | 3 refills | Status: DC

## 2018-09-03 MED ORDER — FLUTICASONE FUROATE-VILANTEROL 200-25 MCG/INH IN AEPB: 1.0000 | INHALATION_SPRAY | Freq: Every day | RESPIRATORY_TRACT | 3 refills | Status: DC

## 2018-09-03 NOTE — Progress Notes (Signed)
Edward Lee    [id number]    11-03-1955  Primary Care Physician:Hunter, Brayton Mars, MD  Referring Physician: [name], MD [place] [place], [place] [address]  Chief complaint:  Follow up for asthma  HPI: 63 year old with history of allergies, hyperlipidemia, irritable bowel syndrome, prostate cancer.  Complains of increasing dyspnea since September 2018 after return from travel to [place].  Symptoms associated with chest congestion, wheezing mostly at night, nonproductive cough.  Denies any sputum production, fevers, chills.  He was treated with several rounds of prednisone which improves symptoms temporarily.  Started on Flovent in December 2018 by Dr. Yong Channel, primary care.  He is also on albuterol as needed.  Feels that the symptoms are improved but not completely resolved He has history of seasonal allergies, denies acid reflux.  There is a strong history of asthma in the family.    Pets: Dogs, no cats, birds Occupation: Works for the [employer].  Works from home Exposures: No known exposures.  No mold at home.  Hardwood, carpet at home.  Forced air heating Smoking history: Never smoker  Travel History: Traveled to [place], [place] recently.  No other significant travel.  Interim History: Since last visit he has had recurrent episodes of sinusitis, asthma exacerbations requiring antibiotics and prednisone tapers at his primary care Continues on Breo and Spiriva.  Outpatient Encounter Medications as of 09/03/2018  Medication Sig  . albuterol (PROVENTIL HFA;VENTOLIN HFA) 108 (90 Base) MCG/ACT inhaler Inhale 2 puffs into the lungs every 6 (six) hours as needed for wheezing or shortness of breath.  . citalopram (CELEXA) 40 MG tablet Take 1 tablet (40 mg total) by mouth daily.  . fluticasone furoate-vilanterol (BREO ELLIPTA) 200-25 MCG/INH AEPB Inhale 1 puff into the lungs daily.  . sildenafil (REVATIO) 20 MG tablet Take 20 mg by mouth as needed (2  -5 tabs as needed).  . simvastatin (ZOCOR) 20 MG tablet TAKE 1 TABLET AT BEDTIME  . Tiotropium Bromide Monohydrate (SPIRIVA RESPIMAT) 2.5 MCG/ACT AERS Inhale 2 puffs into the lungs daily.  . [DISCONTINUED] doxycycline (VIBRA-TABS) 100 MG tablet Take 1 tablet (100 mg total) by mouth 2 (two) times daily for 14 days.  . [DISCONTINUED] predniSONE (DELTASONE) 20 MG tablet Take 2 pills for 3 days, 1 pill for 3 days, 1/2 pill for 3 days, 1/2 pill every other day until finished   No facility-administered encounter medications on file as of 09/03/2018.     Allergies as of 09/03/2018 - Review Complete 09/03/2018  Allergen Reaction Noted  . Codeine      Past Medical History:  Diagnosis Date  . Allergy   . Chronic lower back pain   . Herniated disc    L1  . Hypercholesteremia   . IBS (irritable bowel syndrome)    ? possible  . Prostate cancer [facility])     Past Surgical History:  Procedure Laterality Date  . CATARACT EXTRACTION    . COLONOSCOPY    . PILONIDAL CYST EXCISION  63 yrs old  . PROSTATE BIOPSY  02/11/15    Family History  Problem Relation Age of Onset  . Breast cancer Mother        survived without recurrence  . Prostate cancer Father        around age 1 -surgeyr  . Dementia Father        died from this at age 54  . Healthy Sister   . Healthy Brother   .  Hemochromatosis Son        none in patient  . Down syndrome Son   . Colon cancer Neg Hx   . Esophageal cancer Neg Hx   . Rectal cancer Neg Hx   . Stomach cancer Neg Hx   . Pancreatic cancer Neg Hx     Social History   Socioeconomic History  . Marital status: Married    Spouse name: Not on file  . Number of children: 2  . Years of education: Not on file  . Highest education level: Not on file  Occupational History  . Occupation: Buyer, retail  Social Needs  . Financial resource strain: Not on file  . Food insecurity:    Worry: Not on file    Inability: Not on file  . Transportation needs:     Medical: Not on file    Non-medical: Not on file  Tobacco Use  . Smoking status: Never Smoker  . Smokeless tobacco: Never Used  Substance and Sexual Activity  . Alcohol use: Yes    Alcohol/week: 14.0 - 15.0 standard drinks    Types: 7 - 8 Glasses of wine, 7 Standard drinks or equivalent per week    Comment: occ  . Drug use: No  . Sexual activity: Yes  Lifestyle  . Physical activity:    Days per week: Not on file    Minutes per session: Not on file  . Stress: Not on file  Relationships  . Social connections:    Talks on phone: Not on file    Gets together: Not on file    Attends religious service: Not on file    Active member of club or organization: Not on file    Attends meetings of clubs or organizations: Not on file    Relationship status: Not on file  . Intimate partner violence:    Fear of current or ex partner: Not on file    Emotionally abused: Not on file    Physically abused: Not on file    Forced sexual activity: Not on file  Other Topics Concern  . Not on file  Social History Narrative   Married 61. Son with downs syndrome and daughter is deaf.       Works for [employer] from home.       Hobbies: enjoys walking/exercise, painting, gardening.    Review of systems: Review of Systems  Constitutional: Negative for fever and chills.  HENT: Negative.   Eyes: Negative for blurred vision.  Respiratory: as per HPI  Cardiovascular: Negative for chest pain and palpitations.  Gastrointestinal: Negative for vomiting, diarrhea, blood per rectum. Genitourinary: Negative for dysuria, urgency, frequency and hematuria.  Musculoskeletal: Negative for myalgias, back pain and joint pain.  Skin: Negative for itching and rash.  Neurological: Negative for dizziness, tremors, focal weakness, seizures and loss of consciousness.  Endo/Heme/Allergies: Negative for environmental allergies.  Psychiatric/Behavioral: Negative for depression, suicidal ideas and  hallucinations.  All other systems reviewed and are negative.  Physical Exam: Blood pressure 122/70, pulse 78, height 6\' 2"  (1.88 m), weight 202 lb 3.2 oz (91.7 kg), SpO2 95 %. Gen:      No acute distress HEENT:  EOMI, sclera anicteric Neck:     No masses; no thyromegaly Lungs:    Clear to auscultation bilaterally; normal respiratory effort CV:         Regular rate and rhythm; no murmurs Abd:      + bowel sounds; soft, non-tender; no palpable masses,  no distension Ext:    No edema; adequate peripheral perfusion Skin:      Warm and dry; no rash Neuro: alert and oriented x 3 Psych: normal mood and affect  Data Reviewed: FENO  07/25/17- 54 08/27/2017-85 11/22/2017-99 11/28/2017-157 02/27/2018-55  Chest x-ray 05/24/17--Clear lungs, no acute abnormality.  I have reviewed the images personally.  CBC 12/14/14-WBC count 6.6, absolute eosinophil count 400  CBC 07/25/17-WBC count 9.1, absolute eosinophil count 500 Blood allergy profile 07/25/17-IgE 58, RAST panel is negative.  PFTs 08/27/17 FVC 6.18 [117%], FEV1 5.09 [128%], F/F 82, TLC 112%, DLCO 94% Normal study.  Assessment:  Severe persistent asthma Symptoms are consistent with asthma with elevated FENO and CBCs showing eosinophilia. PFTs reviewed which do not show any obstruction.  There is improvement in mid flow rates post albuterol indicating small airways disease  Continues on Breo and Spiriva. As he continues to have recurrent symptoms with sinusitis which is making the asthma difficult to control I have suggested an ENT evaluation.  He wants to discuss with his primary care about this.  He would also benefit from starting a biologic but he had been reluctant to start in the past. We had a discussion again today about starting immunotherapy with Dupixent.  He would like to hold off for now  Plan/Recommendations: - Continue Breo, Spiriva  Follow-up in 3 months.  [name] MD [facility] 09/03/2018,  3:33 PM  CC: [name], MD

## 2018-09-03 NOTE — Patient Instructions (Signed)
We will continue on the Breo and Spiriva.  We will call in a refill for these Please continue to monitor your sinus symptoms.  If you have persistent issues with sinusitis we may need to consider an ENT referral Follow-up in pulmonary clinic in 3 months.

## 2018-09-08 ENCOUNTER — Encounter: Payer: Self-pay | Admitting: Family Medicine

## 2018-09-08 DIAGNOSIS — J329 Chronic sinusitis, unspecified: Secondary | ICD-10-CM

## 2018-09-11 ENCOUNTER — Encounter: Payer: Self-pay | Admitting: Family Medicine

## 2018-09-15 ENCOUNTER — Other Ambulatory Visit: Payer: Self-pay | Admitting: Otolaryngology

## 2018-09-15 ENCOUNTER — Ambulatory Visit
Admission: RE | Admit: 2018-09-15 | Discharge: 2018-09-15 | Disposition: A | Discharge status: Home / Self Care | Payer: 59 | Source: Ambulatory Visit | Attending: Otolaryngology | Admitting: Otolaryngology

## 2018-09-15 DIAGNOSIS — J322 Chronic ethmoidal sinusitis: Secondary | ICD-10-CM

## 2018-09-15 DIAGNOSIS — J320 Chronic maxillary sinusitis: Secondary | ICD-10-CM

## 2018-09-16 ENCOUNTER — Encounter: Payer: Self-pay | Admitting: Family Medicine

## 2018-09-16 NOTE — Telephone Encounter (Signed)
Dr. Yong Channel okay for referral to Allergist?

## 2018-09-17 ENCOUNTER — Other Ambulatory Visit: Payer: Self-pay | Admitting: *Deleted

## 2018-09-17 DIAGNOSIS — J4530 Mild persistent asthma, uncomplicated: Secondary | ICD-10-CM

## 2018-09-17 DIAGNOSIS — J329 Chronic sinusitis, unspecified: Secondary | ICD-10-CM

## 2018-09-18 ENCOUNTER — Encounter: Payer: Self-pay | Admitting: Family Medicine

## 2018-09-18 MED ORDER — BENZONATATE 100 MG PO CAPS: 100.0000 mg | ORAL_CAPSULE | Freq: Two times a day (BID) | ORAL | 0 refills | Status: DC | PRN

## 2018-09-22 ENCOUNTER — Ambulatory Visit: Payer: Self-pay | Admitting: *Deleted

## 2018-09-22 ENCOUNTER — Encounter: Payer: Self-pay | Admitting: Family Medicine

## 2018-09-22 ENCOUNTER — Ambulatory Visit: Payer: 59 | Admitting: Family Medicine

## 2018-09-22 ENCOUNTER — Ambulatory Visit (INDEPENDENT_AMBULATORY_CARE_PROVIDER_SITE_OTHER): Payer: 59

## 2018-09-22 ENCOUNTER — Ambulatory Visit: Payer: 59 | Admitting: Physician Assistant

## 2018-09-22 VITALS — BP 98/64 | HR 62 | Temp 98.2°F | Ht 74.0 in | Wt 201.8 lb

## 2018-09-22 DIAGNOSIS — R0781 Pleurodynia: Secondary | ICD-10-CM

## 2018-09-22 DIAGNOSIS — R059 Cough, unspecified: Secondary | ICD-10-CM

## 2018-09-22 DIAGNOSIS — R05 Cough: Secondary | ICD-10-CM

## 2018-09-22 MED ORDER — BENZONATATE 100 MG PO CAPS: 100.0000 mg | ORAL_CAPSULE | Freq: Two times a day (BID) | ORAL | 1 refills | Status: DC | PRN

## 2018-09-22 NOTE — Telephone Encounter (Signed)
This has been resolved, patient is actually seeing Dr. Yong Channel. Routing for documentation.

## 2018-09-22 NOTE — Progress Notes (Signed)
Phone [phone]   Subjective:  Edward Lee is a 63 y.o. year old very pleasant male patient who presents for/with See problem oriented charting ROS-continued cough and sinus congestion.  Some improvement with Tessalon.  Was much improved on azithromycin but then worsened when he stopped.  Complains of right-sided chest pain.  No exertional chest pain.  Past Medical History-  Patient Active Problem List   Diagnosis Date Noted  . Malignant neoplasm of prostate ([place]) 04/26/2015    Priority: High  . Chronic low back pain 01/10/2017    Priority: Medium  . Urinary frequency 11/28/2011    Priority: Medium  . Hyperlipidemia 03/21/2011    Priority: Medium  . PTSD (post-traumatic stress disorder) 09/06/2010    Priority: Medium  . IBS (irritable bowel syndrome)     Priority: Low  . Severe persistent asthma with acute exacerbation 02/27/2018  . Mild persistent asthma without complication 85/27/7824    Medications- reviewed and updated Current Outpatient Medications  Medication Sig Dispense Refill  . albuterol (PROVENTIL HFA;VENTOLIN HFA) 108 (90 Base) MCG/ACT inhaler Inhale 2 puffs into the lungs every 6 (six) hours as needed for wheezing or shortness of breath. 1 Inhaler 2  . benzonatate (TESSALON) 100 MG capsule Take 1 capsule (100 mg total) by mouth 2 (two) times daily as needed for cough. 40 capsule 1  . citalopram (CELEXA) 40 MG tablet Take 1 tablet (40 mg total) by mouth daily. 90 tablet 3  . Fexofenadine-Pseudoephedrine (ALLEGRA-D 12 HOUR PO) Take by mouth. Patient is taking daily.    . fluticasone furoate-vilanterol (BREO ELLIPTA) 200-25 MCG/INH AEPB Inhale 1 puff into the lungs daily. 180 each 3  . sildenafil (REVATIO) 20 MG tablet Take 20 mg by mouth as needed (2 -5 tabs as needed).    . simvastatin (ZOCOR) 20 MG tablet TAKE 1 TABLET AT BEDTIME 90 tablet 2  . Tiotropium Bromide Monohydrate (SPIRIVA RESPIMAT) 2.5 MCG/ACT AERS Inhale 2 puffs into the lungs daily. 3 Inhaler 3    No current facility-administered medications for this visit.      Objective:  BP 98/64 (BP Location: Left Arm, Patient Position: Sitting, Cuff Size: Normal)   Pulse 62   Temp 98.2 F (36.8 C) (Oral)   Ht 6\' 2"  (1.88 m)   Wt 201 lb 12.8 oz (91.5 kg)   SpO2 96%   BMI 25.91 kg/m  Gen: NAD, resting comfortably, intermittent cough Nasal turbinate erythema with some yellow discharge, pharynx with mild erythema.  Air-fluid level in left ear noted. CV: RRR no murmurs rubs or gallops Tender in pinpoint area over right anterior lower ribs Lungs: CTAB no crackles, wheeze, rhonchi Abdomen: soft/nontender/nondistended Ext: no edema Skin: warm, dry    Assessment and Plan   Chronic cough with history of asthma and recent sinusitis  Right-sided chest pain-tender to palpation  s: Now 3 months of cough- gets better on prednisone and antibiotic but worsens again when comes off  09/03/2018- 2-3 weeks ago saw pulmonary- continued current asthma therapy and said 3 month follow up  Due to sinus congestion we then referred to ENT Dr. Fernand Parkins saw him last week and put him on a 3-day course of azithromycin.  Sinus congestion and cough seem to improve on this but not fully resolved-worsened when he stopped medication.  States never had full resolution.  I sent in [facility] for him last week and he states those take the edge off. Allegra d seems to help. Still getting a lot of draining from  his sinuses and in his throat.   Allergist planned 11/16/17.   Patient does not want to start another course of antibiotics until he sees Dr. Claria Dice either planned on the 17th or sooner since he is not improving.  His concern today was making sure no obvious pneumonia-also concerned about potential fractured rib.  Noted right sided chest pain when getting off of couch but has had a lot of coughing fits. Worse with coughing or breathing in. No exertional pain. Not more short of breath than normal. Albuterol is  helpful.  A/P: I suspect patient either has musculoskeletal strain or costochondritis from continued cough.  Chest x-ray pending today to make sure no pneumonia or obvious rib fracture- we did not order dedicated rib films as we wanted to get full lung view.  In addition rib fracture would not largely change management.  Refilled Tessalon for him as helpful.  He plans to call ENT to see if he can be seen sooner.  Future Appointments  Date Time Provider [place]  11/17/2018  2:00 PM Garnet Sierras, DO [facility] None   Lab/Order associations: Cough - Plan: DG Chest 2 View  Return precautions advised.  [name], MD

## 2018-09-22 NOTE — Patient Instructions (Addendum)
Sorry you are not doing better yet  I agree with Dr. Ernesto Rutherford follow up - sooner if possible since worsening  You opted to want to chat with him first instead of using another antibiotic  We will reach out to you when x-ray results available- I dont see an obvious fracture or pneumonia but we will await radiology opinion

## 2018-09-22 NOTE — Telephone Encounter (Signed)
Patient called to say that he may possible have cracked a rib from coughing. States that the pain is intense and on the right side mid torso near the lung and is an 8 on a scale of 1-10. Patient wishes to see Dr Yong Channel only advised of a full schedule. Please advise Ph# [phone]  Call to office- Dr Yong Channel schedule is full and he is seeing patient- patient agrees to see another provider- but would like to make sure Dr hunter is aware since ha has been following this.  Reason for Disposition . SEVERE chest or rib pain (e.g., excruciating, unable to do any normal activities)  Answer Assessment - Initial Assessment Questions 1. LOCATION: "Where does it hurt?"      Right side rib 2. RADIATION: "Does the pain shoot anywhere else?" (e.g., chest, back)     concentrated in rib area 3. ONSET: "When did the pain begin?" (e.g., minutes, hours or days ago)      Saturday evening 4. SUDDEN: "Gradual or sudden onset?"     Sudden pain in rib area 5. PATTERN "Does the pain come and go, or is it constant?"    - If constant: "Is it getting better, staying the same, or worsening?"      (Note: Constant means the pain never goes away completely; most serious pain is constant and it progresses)     - If intermittent: "How long does it last?" "Do you have pain now?"     (Note: Intermittent means the pain goes away completely between bouts)     constant pain- hurts more when breaths in pr lays down 6. SEVERITY: "How bad is the pain?"  (e.g., Scale 1-10; mild, moderate, or severe)    - MILD (1-3): doesn't interfere with normal activities, abdomen soft and not tender to touch     - MODERATE (4-7): interferes with normal activities or awakens from sleep, tender to touch     - SEVERE (8-10): excruciating pain, doubled over, unable to do any normal activities       8 now 7. RECURRENT SYMPTOM: "Have you ever had this type of abdominal pain before?" If so, ask: "When was the last time?" and "What happened that time?"       no 8. AGGRAVATING FACTORS: "Does anything seem to cause this pain?" (e.g., foods, stress, alcohol)     Breathing in, laying, and coughing 9. CARDIAC SYMPTOMS: "Do you have any of the following symptoms: chest pain, difficulty breathing, sweating, nausea?"     Just with deep breat only 10. OTHER SYMPTOMS: "Do you have any other symptoms?" (e.g., fever, vomiting, diarrhea)       No ongoing sinusitis constant coughing 11. PREGNANCY: "Is there any chance you are pregnant?" "When was your last menstrual period?"       n/a  Answer Assessment - Initial Assessment Questions 1. MECHANISM: "How did the injury happen?"     Saturday during coughing episode 2. ONSET: "When did the injury happen?" (e.g., minutes, hours, days ago)     saturday 3. LOCATION: "Where on the chest is the injury located?" "Where does it hurt?"     Right side- rib area 4. CHEST OR RIB PAIN SEVERITY: "How bad is the pain?"  (e.g., Scale 1-10; mild, moderate, or severe)    - MILD (1-3): doesn't interfere with normal activities     - MODERATE (4-7): interferes with normal activities or awakens from sleep    - SEVERE (8-10): excruciating pain, unable to do any  normal activities       8 5. BREATHING DIFFICULTY: "Are you having any difficulty breathing?" If so, ask "How bad is it?"  (e.g., none, mild, moderate, severe)  Pain with deep breath, coughing 6: OTHER SYMPTOMS (e.g., cough, fever, rash)      Cough, sinusitis  7. PREGNANCY: "Is there any chance you are pregnant?" "When was your last menstrual period?"    n/a  Protocols used: CHEST INJURY - BENDING, LIFTING, OR TWISTING-A-AH, ABDOMINAL PAIN - UPPER-A-AH

## 2018-09-22 NOTE — Telephone Encounter (Signed)
Noted  

## 2018-10-10 ENCOUNTER — Encounter: Payer: Self-pay | Admitting: Family Medicine

## 2018-10-14 NOTE — Telephone Encounter (Signed)
Please see message . Thank you .

## 2018-10-15 ENCOUNTER — Other Ambulatory Visit: Payer: Self-pay

## 2018-10-15 DIAGNOSIS — H6590 Unspecified nonsuppurative otitis media, unspecified ear: Secondary | ICD-10-CM

## 2018-11-03 ENCOUNTER — Encounter: Payer: Self-pay | Admitting: Family Medicine

## 2018-11-03 NOTE — Telephone Encounter (Signed)
Please check on this referral to ENT.

## 2018-11-17 ENCOUNTER — Other Ambulatory Visit: Payer: Self-pay

## 2018-11-17 ENCOUNTER — Ambulatory Visit: Payer: 59 | Admitting: Allergy

## 2018-11-17 ENCOUNTER — Encounter: Payer: Self-pay | Admitting: Allergy

## 2018-11-17 VITALS — BP 108/60 | HR 76 | Temp 97.6°F | Resp 16 | Ht 74.0 in | Wt 199.4 lb

## 2018-11-17 DIAGNOSIS — J4550 Severe persistent asthma, uncomplicated: Secondary | ICD-10-CM | POA: Insufficient documentation

## 2018-11-17 DIAGNOSIS — J0101 Acute recurrent maxillary sinusitis: Secondary | ICD-10-CM

## 2018-11-17 DIAGNOSIS — J3089 Other allergic rhinitis: Secondary | ICD-10-CM

## 2018-11-17 DIAGNOSIS — J4551 Severe persistent asthma with (acute) exacerbation: Secondary | ICD-10-CM | POA: Insufficient documentation

## 2018-11-17 MED ORDER — FLUTICASONE PROPIONATE 93 MCG/ACT NA EXHU: 2.0000 | INHALANT_SUSPENSION | Freq: Two times a day (BID) | NASAL | 5 refills | Status: DC

## 2018-11-17 NOTE — Assessment & Plan Note (Signed)
Being followed by pulmonology. Diagnosed with asthma 2 years ago and currently on Breo 200 1 puff QD and Spiriva 2.5 2 puffs QD with some benefit. No prior biologics treatment.  Today's spirometry did not show any over abnormalities. . Daily controller medication(s): continue Breo 200 1 puff once a day and rinse mouth afterwards. o Continue Spiriva 2 puffs daily . Prior to physical activity: May use albuterol rescue inhaler 2 puffs 5 to 15 minutes prior to strenuous physical activities. Marland Kitchen Rescue medications: May use albuterol rescue inhaler 2 puffs or nebulizer every 4 to 6 hours as needed for shortness of breath, chest tightness, coughing, and wheezing. Monitor frequency of use. . Follow up with pulmonology as scheduled.

## 2018-11-17 NOTE — Assessment & Plan Note (Addendum)
Recurrent sinus infections from Dec to March requiring multiple courses of antibiotics and prednisone. Concerned for environmental allergy triggers. ENT evaluation unremarkable. Flonase caused some nosebleeds and ineffective.   Today's skin testing showed: Slightly positive to perennial molds. Not sure how much of these sensitivities are contributing to his symptoms.   Get bloodwork to look at underlying immune system.   Keep track of infections.

## 2018-11-17 NOTE — Progress Notes (Signed)
New Patient Note  RE: IRBY FAILS MRN: [id number] DOB: [birth year] Date of Office Visit: 11/17/2018  Referring provider: [name], MD Primary care provider: [name], MD  Chief Complaint: Allergic Rhinitis  and Asthma  History of Present Illness: I had the pleasure of seeing [name] for initial evaluation at the [facility] on 11/17/2018. He is a 63 y.o. male, who is referred here by [name], MD for the evaluation of allergic rhinitis and asthma.   Allergic rhinitis: He reports symptoms of nasal congestion, rhinorrhea, sneezing, coughing, wheezing, itchy/watery eyes. Symptoms have been going on for 5-6 months. Other triggers include exposure to none. Anosmia: diminished sense of smell. Headache: no. He has used Flonase with no benefit. He also used Allegra D, cold and sinus with fair improvement in symptoms. Sinus infections: yes from December to March 2020 - had 3 courses of antibiotics with prednisone. Previous work up includes: 2019 immunocap was negative to environmental allergies. IgE 114 - see scanned records. Previous ENT evaluation: yes. Previous sinus imaging: sinus Xray was normal per patient report.   Asthma: He reports symptoms of chest tightness, shortness of breath, coughing, wheezing for 2 years. Current medications include Breo 200 1 puff QD and Spiriva 2.5 2 puffs once a day which help. He reports not using aerochamber with asthma inhalers. He tried the following inhalers: none. Main asthma triggers are weather changes. In the last month, frequency of asthma symptoms: depends. Frequency of nocturnal symptoms: yes in the past depending on symptoms. Frequency of SABA use: depends. Interference with physical activity: sometimes. Sleep is disturbed. In the last 12 months, emergency room visits/urgent care visits/doctor office visits or hospitalizations due to asthma: 0. In the last 12 months, oral steroids courses: 0. Lifetime  history of hospitalization for asthma: 0. Prior intubations: 0. Asthma was diagnosed at age 89 by spirometry. History of pneumonia: no. He was evaluated by pulmonologist in the past. Smoking exposure: no. Up to date with flu vaccine: no. Up to date with pneumonia vaccine: no.   Assessment and Plan: Bunny is a 63 y.o. male with: Recurrent maxillary sinusitis Recurrent sinus infections from Dec to March requiring multiple courses of antibiotics and prednisone. Concerned for environmental allergy triggers. ENT evaluation unremarkable. Flonase caused some nosebleeds and ineffective.   Today's skin testing showed: Slightly positive to perennial molds. Not sure how much of these sensitivities are contributing to his symptoms.   Get bloodwork to look at underlying immune system.   Keep track of infections.  Other allergic rhinitis Some rhinitis symptoms for the past 5-6 months. 2019 immunocap was negative to environmental allergies.  Today's skin testing was positive to some perennial molds. Discussed environmental control measures.   Start Xhance 2 sprays twice a day. Demonstrated proper use and sample given.   Nasal saline spray (i.e., Simply Saline) or nasal saline lavage (i.e., NeilMed) is recommended as needed and prior to medicated nasal sprays.  May use over the counter antihistamines such as Zyrtec (cetirizine), Claritin (loratadine), Allegra (fexofenadine), or Xyzal (levocetirizine) daily as needed.  Stop nasal sprays if getting epistaxis.   Severe persistent asthma without complication Being followed by pulmonology. Diagnosed with asthma 2 years ago and currently on Breo 200 1 puff QD and Spiriva 2.5 2 puffs QD with some benefit. No prior biologics treatment.  Today's spirometry did not show any over abnormalities. . Daily controller medication(s): continue Breo 200 1 puff once a day and rinse mouth  afterwards. o Continue Spiriva 2 puffs daily . Prior to physical activity: May use  albuterol rescue inhaler 2 puffs 5 to 15 minutes prior to strenuous physical activities. Marland Kitchen Rescue medications: May use albuterol rescue inhaler 2 puffs or nebulizer every 4 to 6 hours as needed for shortness of breath, chest tightness, coughing, and wheezing. Monitor frequency of use. . Follow up with pulmonology as scheduled.   Return in about 2 months (around 01/17/2019).  Meds ordered this encounter  Medications  . Fluticasone Propionate (XHANCE) 93 MCG/ACT EXHU    Sig: Place 2 sprays into the nose 2 (two) times a day.    Dispense:  16 mL    Refill:  5    [phone]    Lab Orders     CBC with Differential/Platelet     IgG, IgA, IgM     Strep pneumoniae 23 Serotypes IgG     Diphtheria / Tetanus Antibody Panel  Other allergy screening: Food allergy: no Medication allergy: yes  Codeine - nausea  Hymenoptera allergy: no Urticaria: no Eczema:no History of recurrent infections suggestive of immunodeficency: Patient has history multiple infections including sinus infection, bronchitis, ear infections. Denies any GI infections/diarrhea, skin infections/abscesses. Patient has no history of opportunistic infections including fungal infections, viral infections.   Patient reports 3 courses of antibiotic use in the last 12 months and 0 hospital admissions. Patient does not have any secondary causes of immunodeficiency including chronic steroid use, diabetes mellitus, protein losing enteropathy, renal or hepatic dysfunction, history of HIV, hepatitis B or C. Patient has prostate cancer which is in remission.   Diagnostics: Spirometry:  Tracings reviewed. His effort: Good reproducible efforts. FVC: 5.26L FEV1: 4.01L, 100% predicted FEV1/FVC ratio: 76% Interpretation: No overt abnormalities noted given today's efforts.  Please see scanned spirometry results for details.  Skin Testing: Environmental allergy panel and select foods. Positive test to: select perennial molds. Results  discussed with patient/family. Airborne Adult Perc - 11/17/18 1439    Time Antigen Placed  1439    Allergen Manufacturer  Lavella Hammock    Location  Back    Number of Test  59    Panel 1  Select    1. Control-Buffer 50% Glycerol  Negative    2. Control-Histamine 1 mg/ml  2+    3. Albumin saline  Negative    4. Longfellow  Negative    5. [place]  Negative    6. Johnson  Negative    7. [place] Blue  Negative    8. Meadow Fescue  Negative    9. Perennial Rye  Negative    10. Sweet Vernal  Negative    11. Timothy  Negative    12. Cocklebur  Negative    13. Burweed Marshelder  Negative    14. Ragweed, short  Negative    15. Ragweed, Giant  Negative    16. Plantain,  English  Negative    17. Lamb's Quarters  Negative    18. Sheep Sorrell  Negative    19. Rough Pigweed  Negative    20. Marsh Elder, Rough  Negative    21. Mugwort, Common  Negative    22. Ash mix  Negative    23. Birch mix  Negative    24. Beech American  Negative    25. Box, Elder  Negative    26. Cedar, red  Negative    27. Cottonwood, [place]  Negative    28. Elm mix  Negative    29. Hickory  mix  Negative    30. Maple mix  Negative    31. Oak, [place] mix  Negative    32. Pecan Pollen  Negative    33. Pine mix  Negative    34. Sycamore Eastern  Negative    35. Franklin, Black Pollen  Negative    36. Alternaria alternata  Negative    37. Cladosporium Herbarum  Negative    38. Aspergillus mix  Negative    39. Penicillium mix  Negative    40. Bipolaris sorokiniana (Helminthosporium)  Negative    41. Drechslera spicifera (Curvularia)  Negative    42. Mucor plumbeus  Negative    43. Fusarium moniliforme  Negative    44. Aureobasidium pullulans (pullulara)  Negative    45. Rhizopus oryzae  Negative    46. Botrytis cinera  Negative    47. Epicoccum nigrum  Negative    48. Phoma betae  Negative    49. Candida Albicans  Negative    50. Trichophyton mentagrophytes  Negative    51. Mite, D Farinae  5,000 AU/ml  Negative     52. Mite, D Pteronyssinus  5,000 AU/ml  Negative    53. Cat Hair 10,000 BAU/ml  Negative    54.  Dog Epithelia  Negative    55. Mixed Feathers  Negative    56. Horse Epithelia  Negative    57. Cockroach, German  Negative    58. Mouse  Negative    59. Tobacco Leaf  Negative     Food Perc - 11/17/18 1439    Time Antigen Placed  1439    Allergen Manufacturer  Lavella Hammock    Location  Back    Number of allergen test  10    Food  Select    1. Peanut  Negative    2. Soybean food  Negative    3. Wheat, whole  Negative    4. Sesame  Negative    5. Milk, cow  Negative    6. Egg White, chicken  Negative    7. Casein  Negative    8. Shellfish mix  Negative    9. Fish mix  Negative    10. Cashew  Negative     Intradermal - 11/17/18 1457    Time Antigen Placed  1457    Allergen Manufacturer  Lavella Hammock    Location  Back    Number of Test  15    Intradermal  Select    Control  Negative    [place]atem[place]  Negative    Johnson  Negative    7 Grass  Negative    Ragweed mix  Negative    Weed mix  Negative    Tree mix  Negative    Mold 1  Negative    Mold 2  2+    Mold 3  Negative    Mold 4  3+    Cat  Negative    Dog  Negative    Cockroach  Negative    Mite mix  Negative       Past Medical History: Patient Active Problem List   Diagnosis Date Noted  . Recurrent maxillary sinusitis 11/17/2018  . Other allergic rhinitis 11/17/2018  . Severe persistent asthma without complication 82/99/3716  . Severe persistent asthma with acute exacerbation 02/27/2018  . Mild persistent asthma without complication 96/78/9381  . Chronic low back pain 01/10/2017  . Malignant neoplasm of prostate ([place]) 04/26/2015  . IBS (irritable bowel syndrome)   .  Urinary frequency 11/28/2011  . Hyperlipidemia 03/21/2011  . PTSD (post-traumatic stress disorder) 09/06/2010   Past Medical History:  Diagnosis Date  . Allergy   . Chronic lower back pain   . Herniated disc    L1  . Hypercholesteremia   . IBS (irritable  bowel syndrome)    ? possible  . Prostate cancer [facility])    Past Surgical History: Past Surgical History:  Procedure Laterality Date  . CATARACT EXTRACTION    . COLONOSCOPY    . PILONIDAL CYST EXCISION  63 yrs old  . PROSTATE BIOPSY  02/11/15   Medication List:  Current Outpatient Medications  Medication Sig Dispense Refill  . albuterol (PROVENTIL HFA;VENTOLIN HFA) 108 (90 Base) MCG/ACT inhaler Inhale 2 puffs into the lungs every 6 (six) hours as needed for wheezing or shortness of breath. 1 Inhaler 2  . benzonatate (TESSALON) 100 MG capsule Take 1 capsule (100 mg total) by mouth 2 (two) times daily as needed for cough. 40 capsule 1  . citalopram (CELEXA) 40 MG tablet Take 1 tablet (40 mg total) by mouth daily. 90 tablet 3  . Fexofenadine-Pseudoephedrine (ALLEGRA-D 12 HOUR PO) Take by mouth. Patient is taking daily.    . fluticasone furoate-vilanterol (BREO ELLIPTA) 200-25 MCG/INH AEPB Inhale 1 puff into the lungs daily. 180 each 3  . sildenafil (REVATIO) 20 MG tablet Take 20 mg by mouth as needed (2 -5 tabs as needed).    . simvastatin (ZOCOR) 20 MG tablet TAKE 1 TABLET AT BEDTIME 90 tablet 2  . Tiotropium Bromide Monohydrate (SPIRIVA RESPIMAT) 2.5 MCG/ACT AERS Inhale 2 puffs into the lungs daily. 3 Inhaler 3  . Fluticasone Propionate (XHANCE) 93 MCG/ACT EXHU Place 2 sprays into the nose 2 (two) times a day. 16 mL 5   No current facility-administered medications for this visit.    Allergies: Allergies  Allergen Reactions  . Codeine     REACTION: nausea vomiting   Social History: Social History   Socioeconomic History  . Marital status: Married    Spouse name: Not on file  . Number of children: 2  . Years of education: Not on file  . Highest education level: Not on file  Occupational History  . Occupation: Buyer, retail  Social Needs  . Financial resource strain: Not on file  . Food insecurity:    Worry: Not on file    Inability: Not on file  . Transportation  needs:    Medical: Not on file    Non-medical: Not on file  Tobacco Use  . Smoking status: Never Smoker  . Smokeless tobacco: Never Used  Substance and Sexual Activity  . Alcohol use: Yes    Alcohol/week: 14.0 - 15.0 standard drinks    Types: 7 - 8 Glasses of wine, 7 Standard drinks or equivalent per week    Comment: occ  . Drug use: No  . Sexual activity: Yes  Lifestyle  . Physical activity:    Days per week: Not on file    Minutes per session: Not on file  . Stress: Not on file  Relationships  . Social connections:    Talks on phone: Not on file    Gets together: Not on file    Attends religious service: Not on file    Active member of club or organization: Not on file    Attends meetings of clubs or organizations: Not on file    Relationship status: Not on file  Other Topics Concern  . Not on  file  Social History Narrative   Married 55. Son with downs syndrome and daughter is deaf.       Works for [employer] from home.       Hobbies: enjoys walking/exercise, painting, gardening.    Lives in a 65 year old home. 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 Breast cancer Mother        survived without recurrence  . Prostate cancer Father        around age 24 -surgeyr  . Dementia Father        died from this at age 39  . Healthy Sister   . Healthy Brother   . Hemochromatosis Son        none in patient  . Down syndrome Son   . Colon cancer Neg Hx   . Esophageal cancer Neg Hx   . Rectal cancer Neg Hx   . Stomach cancer Neg Hx   . Pancreatic cancer Neg Hx    Problem                               Relation Asthma                                   Brother, maternal GM Eczema                                 No  Food allergy                          No  Allergic rhino conjunctivitis     No   Review of Systems  Constitutional: Negative for appetite change, chills, fever and unexpected weight change.  HENT: Positive for congestion and rhinorrhea.   Eyes: Negative for itching.  Respiratory: Negative for cough, chest tightness, shortness of breath and wheezing.   Cardiovascular: Negative for chest pain.  Gastrointestinal: Negative for abdominal pain.  Genitourinary: Negative for difficulty urinating.  Skin: Negative for rash.  Allergic/Immunologic: Negative for environmental allergies and food allergies.  Neurological: Positive for headaches.   Objective: BP 108/60   Pulse 76   Temp 97.6 F (36.4 C) (Tympanic)   Resp 16   Ht 6\' 2"  (1.88 m)   Wt 199 lb 6.4 oz (90.4 kg)   SpO2 96%   BMI 25.60 kg/m  Body mass index is 25.6 kg/m. Physical Exam  Constitutional: He is oriented to person, place, and time. He appears well-developed and well-nourished.  HENT:  Head: Normocephalic and atraumatic.  Right Ear: External ear normal.  Left Ear: External ear normal.  Nose: Nose normal.  Mouth/Throat: Oropharynx is clear and moist.  Eyes: Conjunctivae and EOM are normal.  Neck: Neck supple.  Cardiovascular: Normal rate, regular rhythm and normal heart sounds. Exam reveals no gallop and no friction rub.  No murmur heard. Pulmonary/Chest: Effort normal and breath sounds normal. He has no wheezes. He has no rales.  Abdominal: Soft.  Neurological: He is alert and oriented to person, place, and time.  Skin:  Skin is warm. No rash noted.  Psychiatric: He has a normal mood and affect. His behavior is normal.  Nursing note and vitals reviewed.  The plan was reviewed with the patient/family, and all questions/concerned were addressed.  It was my pleasure to see Edward Lee today and participate in his care. Please feel free to contact me with any questions or concerns.  Sincerely,  Rexene Alberts, DO Allergy  & Immunology  [facility] office: [phone] [facility] office: [phone]

## 2018-11-17 NOTE — Assessment & Plan Note (Addendum)
Some rhinitis symptoms for the past 5-6 months. 2019 immunocap was negative to environmental allergies.  Today's skin testing was positive to some perennial molds. Discussed environmental control measures.   Start Xhance 2 sprays twice a day. Demonstrated proper use and sample given.   Nasal saline spray (i.e., Simply Saline) or nasal saline lavage (i.e., NeilMed) is recommended as needed and prior to medicated nasal sprays.  May use over the counter antihistamines such as Zyrtec (cetirizine), Claritin (loratadine), Allegra (fexofenadine), or Xyzal (levocetirizine) daily as needed.  Stop nasal sprays if getting epistaxis.

## 2018-11-17 NOTE — Patient Instructions (Addendum)
Today's skin testing showed: Slightly positive to perennial molds  Get bloodwork - CBC diff, IgG, IgA, IgM, s pneumo, tetanus & diptheria  Keep track of infections.   Start Xhnace 2 sprays twice a day.   Nasal saline spray (i.e., Simply Saline) or nasal saline lavage (i.e., NeilMed) is recommended as needed and prior to medicated nasal sprays.  May use over the counter antihistamines such as Zyrtec (cetirizine), Claritin (loratadine), Allegra (fexofenadine), or Xyzal (levocetirizine) daily as needed.  If you get nosebleeds let me know and stop the nasal spray. Nose Bleeds: Nosebleeds are very common.  Site of the bleeding is typically on the septum or at the very front of the nose.  Some of the more common causes are from trauma, inflammation or medication induced. Preventative treatment: 1.  Apply saline nasal gel in each nostril twice a day for 2 weeks to allow the nasal mucosa to heal 2.  Consider using a humidifier in the winter 3. Try to keep your blood pressure as normal as possible (120/80)  Asthma: . Daily controller medication(s): continue Breo 200 1 puff once a day and rinse mouth afterwards. o Continue Spiriva 2 puffs daily . Prior to physical activity: May use albuterol rescue inhaler 2 puffs 5 to 15 minutes prior to strenuous physical activities. Marland Kitchen Rescue medications: May use albuterol rescue inhaler 2 puffs or nebulizer every 4 to 6 hours as needed for shortness of breath, chest tightness, coughing, and wheezing. Monitor frequency of use.  . Asthma control goals:  o Full participation in all desired activities (may need albuterol before activity) o Albuterol use two times or less a week on average (not counting use with activity) o Cough interfering with sleep two times or less a month o Oral steroids no more than once a year o No hospitalizations  Follow up in 2 months  Mold Control . Mold and fungi can grow on a variety of surfaces provided certain temperature and  moisture conditions exist.  . Outdoor molds grow on plants, decaying vegetation and soil. The major outdoor mold, Alternaria and Cladosporium, are found in very high numbers during hot and dry conditions. Generally, a late summer - fall peak is seen for common outdoor fungal spores. Rain will temporarily lower outdoor mold spore count, but counts rise rapidly when the rainy period ends. . The most important indoor molds are Aspergillus and Penicillium. Dark, humid and poorly ventilated basements are ideal sites for mold growth. The next most common sites of mold growth are the bathroom and the kitchen. Outdoor (Seasonal) Mold Control . Use air conditioning and keep windows closed. . Avoid exposure to decaying vegetation. Marland Kitchen Avoid leaf raking. . Avoid grain handling. . Consider wearing a face mask if working in moldy areas.  Indoor (Perennial) Mold Control  . Maintain humidity below 50%. . Get rid of mold growth on hard surfaces with water, detergent and, if necessary, 5% bleach (do not mix with other cleaners). Then dry the area completely. If mold covers an area more than 10 square feet, consider hiring an indoor environmental professional. . For clothing, washing with soap and water is best. If moldy items cannot be cleaned and dried, throw them away. . Remove sources e.g. contaminated carpets. . Repair and seal leaking roofs or pipes. Using dehumidifiers in damp basements may be helpful, but empty the water and clean units regularly to prevent mildew from forming. All rooms, especially basements, bathrooms and kitchens, require ventilation and cleaning to deter mold and mildew  growth. Avoid carpeting on concrete or damp floors, and storing items in damp areas.

## 2018-11-25 LAB — DIPHTHERIA / TETANUS ANTIBODY PANEL
Diphtheria Ab: 0.24 IU/mL (ref <0.10)
Tetanus Ab, IgG: 3.96 IU/mL (ref <0.10)

## 2018-11-25 LAB — STREP PNEUMONIAE 23 SEROTYPES IGG
Pneumo Ab Type 1*: 0.7 ug/mL — ABNORMAL LOW (ref >1.3)
Pneumo Ab Type 12 (12F)*: 0.6 ug/mL — ABNORMAL LOW (ref >1.3)
Pneumo Ab Type 14*: 0.5 ug/mL — ABNORMAL LOW (ref >1.3)
Pneumo Ab Type 17 (17F)*: <0.1 ug/mL — ABNORMAL LOW (ref >1.3)
Pneumo Ab Type 19 (19F)*: 8.1 ug/mL (ref >1.3)
Pneumo Ab Type 2*: <0.1 ug/mL — ABNORMAL LOW (ref >1.3)
Pneumo Ab Type 20*: 0.6 ug/mL — ABNORMAL LOW (ref >1.3)
Pneumo Ab Type 22 (22F)*: 0.5 ug/mL — ABNORMAL LOW (ref >1.3)
Pneumo Ab Type 23 (23F)*: <0.1 ug/mL — ABNORMAL LOW (ref >1.3)
Pneumo Ab Type 26 (6B)*: 0.2 ug/mL — ABNORMAL LOW (ref >1.3)
Pneumo Ab Type 3*: 13.2 ug/mL (ref >1.3)
Pneumo Ab Type 34 (10A)*: <0.1 ug/mL — ABNORMAL LOW (ref >1.3)
Pneumo Ab Type 4*: 0.3 ug/mL — ABNORMAL LOW (ref >1.3)
Pneumo Ab Type 43 (11A)*: 5 ug/mL (ref >1.3)
Pneumo Ab Type 5*: <0.1 ug/mL — ABNORMAL LOW (ref >1.3)
Pneumo Ab Type 51 (7F)*: 1 ug/mL — ABNORMAL LOW (ref >1.3)
Pneumo Ab Type 54 (15B)*: <0.1 ug/mL — ABNORMAL LOW (ref >1.3)
Pneumo Ab Type 56 (18C)*: 1.4 ug/mL (ref >1.3)
Pneumo Ab Type 57 (19A)*: 18.3 ug/mL (ref >1.3)
Pneumo Ab Type 68 (9V)*: 0.3 ug/mL — ABNORMAL LOW (ref >1.3)
Pneumo Ab Type 70 (33F)*: 1.3 ug/mL — ABNORMAL LOW (ref >1.3)
Pneumo Ab Type 8*: 0.3 ug/mL — ABNORMAL LOW (ref >1.3)
Pneumo Ab Type 9 (9N)*: 0.4 ug/mL — ABNORMAL LOW (ref >1.3)

## 2018-11-25 LAB — CBC WITH DIFFERENTIAL/PLATELET
Basophils Absolute: 0 10*3/uL (ref 0.0–0.2)
Basos: 1 %
EOS (ABSOLUTE): 0.7 10*3/uL — ABNORMAL HIGH (ref 0.0–0.4)
Eos: 8 %
Hematocrit: 45.1 % (ref 37.5–51.0)
Hemoglobin: 15.6 g/dL (ref 13.0–17.7)
Immature Grans (Abs): 0.1 10*3/uL (ref 0.0–0.1)
Immature Granulocytes: 1 %
Lymphocytes Absolute: 1.2 10*3/uL (ref 0.7–3.1)
Lymphs: 15 %
MCH: 31.1 pg (ref 26.6–33.0)
MCHC: 34.6 g/dL (ref 31.5–35.7)
MCV: 90 fL (ref 79–97)
Monocytes Absolute: 0.5 10*3/uL (ref 0.1–0.9)
Monocytes: 6 %
Neutrophils Absolute: 5.7 10*3/uL (ref 1.4–7.0)
Neutrophils: 69 %
Platelets: 208 10*3/uL (ref 150–450)
RBC: 5.01 x10E6/uL (ref 4.14–5.80)
RDW: 12.4 % (ref 11.6–15.4)
WBC: 8.1 10*3/uL (ref 3.4–10.8)

## 2018-11-25 LAB — IGG, IGA, IGM
IgA/Immunoglobulin A, Serum: 242 mg/dL (ref 61–437)
IgG (Immunoglobin G), Serum: 1346 mg/dL (ref 603–1613)
IgM (Immunoglobulin M), Srm: 73 mg/dL (ref 20–172)

## 2018-11-26 ENCOUNTER — Encounter: Payer: Self-pay | Admitting: Allergy

## 2018-11-27 ENCOUNTER — Telehealth: Payer: Self-pay | Admitting: Family Medicine

## 2018-11-27 NOTE — Telephone Encounter (Signed)
Okay for pt to receive Pneumonia vaccine. Pt does not have any high risk factors as I am aware of. Pt is not 63 years old.  Please advise

## 2018-11-27 NOTE — Telephone Encounter (Signed)
May give pneumovax 23 to him- tell him likely will do prevnar 13 in 1 year then repeat pneumovax in 5 years since we are doing 1st pneumovax prior to age 63

## 2018-11-27 NOTE — Telephone Encounter (Signed)
Copied from [place] [id number]. Topic: Appointment Scheduling - Scheduling Inquiry for Clinic >> Nov 27, 2018  2:38 PM Margot Ables wrote: Reason for CRM: pt states that his allergy specialist advised him to call and schedule pneumonia vaccine with PCP office. Please advise if ok to schedule.

## 2018-11-28 NOTE — Telephone Encounter (Signed)
Pt notified of update and has been scheduled!

## 2018-12-04 ENCOUNTER — Ambulatory Visit (INDEPENDENT_AMBULATORY_CARE_PROVIDER_SITE_OTHER): Payer: 59 | Admitting: Family Medicine

## 2018-12-04 DIAGNOSIS — Z23 Encounter for immunization: Secondary | ICD-10-CM

## 2018-12-04 NOTE — Patient Instructions (Signed)
There are no preventive care reminders to display for this patient.  Depression screen [facility] 2/9 07/25/2018 04/02/2018 03/11/2017  Decreased Interest 0 0 0  Down, Depressed, Hopeless 0 0 0  PHQ - 2 Score 0 0 0

## 2018-12-04 NOTE — Progress Notes (Signed)
Per orders of Dr. Yong Channel, injection of Pneumoncoccal Vaccine Polyvalent given by Franco Collet. Patient tolerated injection well.   I have reviewed and agree with note, evaluation, plan.   [name], MD

## 2019-01-19 ENCOUNTER — Ambulatory Visit: Payer: 59 | Admitting: Allergy

## 2019-02-02 ENCOUNTER — Ambulatory Visit: Payer: 59 | Admitting: Pulmonary Disease

## 2019-02-05 ENCOUNTER — Other Ambulatory Visit: Payer: Self-pay | Admitting: Allergy

## 2019-02-12 ENCOUNTER — Other Ambulatory Visit: Payer: Self-pay

## 2019-02-12 ENCOUNTER — Encounter: Payer: Self-pay | Admitting: Pulmonary Disease

## 2019-02-12 ENCOUNTER — Ambulatory Visit: Payer: 59 | Admitting: Pulmonary Disease

## 2019-02-12 VITALS — BP 122/72 | HR 77 | Temp 97.6°F | Ht 74.0 in | Wt 194.0 lb

## 2019-02-12 DIAGNOSIS — J4550 Severe persistent asthma, uncomplicated: Secondary | ICD-10-CM

## 2019-02-12 NOTE — Progress Notes (Signed)
Edward Lee    [id number]    11-19-1955  Primary Care Physician:Hunter, Brayton Mars, MD  Referring Physician: [name], MD [place],  [place] [id number]  Chief complaint:  Follow up for asthma  HPI: 63 year old with history of allergies, hyperlipidemia, irritable bowel syndrome, prostate cancer.  Complains of increasing dyspnea since September 2018 after return from travel to [place].  Symptoms associated with chest congestion, wheezing mostly at night, nonproductive cough.  Denies any sputum production, fevers, chills.  He was treated with several rounds of prednisone which improves symptoms temporarily.  Started on Flovent in December 2018 by Dr. Yong Channel, primary care.   Referred from recurrent exacerbations throughout 2019 Inhalers changed to Breo and Spiriva. He has history of seasonal allergies, denies acid reflux.  There is a strong history of asthma in the family.    Pets: Dogs, no cats, birds Occupation: Works for [employer].  Works from home Exposures: No known exposures.  No mold at home.  Hardwood, carpet at home.  Forced air heating Smoking history: Never smoker  Travel History: Traveled to [place], [place] recently.  No other significant travel.  ACQ 7 02/12/2019- 0.43  Interim History: States that his breathing has improved.  Hardly needs to use his rescue inhaler  Outpatient Encounter Medications as of 02/12/2019  Medication Sig  . albuterol (PROVENTIL HFA;VENTOLIN HFA) 108 (90 Base) MCG/ACT inhaler Inhale 2 puffs into the lungs every 6 (six) hours as needed for wheezing or shortness of breath.  . benzonatate (TESSALON) 100 MG capsule Take 1 capsule (100 mg total) by mouth 2 (two) times daily as needed for cough.  . citalopram (CELEXA) 40 MG tablet Take 1 tablet (40 mg total) by mouth daily.  Marland Kitchen Fexofenadine-Pseudoephedrine (ALLEGRA-D 12 HOUR PO) Take by mouth. Patient is taking daily.  . fluticasone furoate-vilanterol  (BREO ELLIPTA) 200-25 MCG/INH AEPB Inhale 1 puff into the lungs daily.  . sildenafil (REVATIO) 20 MG tablet Take 20 mg by mouth as needed (2 -5 tabs as needed).  . simvastatin (ZOCOR) 20 MG tablet TAKE 1 TABLET AT BEDTIME  . Tiotropium Bromide Monohydrate (SPIRIVA RESPIMAT) 2.5 MCG/ACT AERS Inhale 2 puffs into the lungs daily.  Truett Perna 93 MCG/ACT EXHU BLOW 2 DOSES IN EACH NOSTRIL TWICE DAILY   No facility-administered encounter medications on file as of 02/12/2019.    Physical Exam: Blood pressure 122/72, pulse 77, temperature 97.6 F (36.4 C), temperature source Oral, height 6\' 2"  (1.88 m), weight 194 lb (88 kg), SpO2 96 %. Gen:      No acute distress HEENT:  EOMI, sclera anicteric Neck:     No masses; no thyromegaly Lungs:    Clear to auscultation bilaterally; normal respiratory effort CV:         Regular rate and rhythm; no murmurs Abd:      + bowel sounds; soft, non-tender; no palpable masses, no distension Ext:    No edema; adequate peripheral perfusion Skin:      Warm and dry; no rash Neuro: alert and oriented x 3 Psych: normal mood and affect  Data Reviewed: FENO  07/25/17- 54 08/27/2017-85 11/22/2017-99 11/28/2017-157 02/27/2018-55  Chest x-ray 05/24/17--Clear lungs, no acute abnormality.  I have reviewed the images personally.  CBC 12/14/14-WBC count 6.6, absolute eosinophil count 400  CBC 07/25/17-WBC count 9.1, absolute eosinophil count 500 Blood allergy profile 07/25/17-IgE 58, RAST panel is negative.  PFTs 08/27/17 FVC 6.18 [117%], FEV1 5.09 [128%],  F/F 82, TLC 112%, DLCO 94% Normal study.  Assessment:  Severe persistent asthma Symptoms are consistent with asthma with elevated FENO and CBCs showing eosinophilia. PFTs reviewed which do not show any obstruction.  There is improvement in mid flow rates post albuterol indicating small airways disease  Continues on Breo and Spiriva. After frequent exacerbations in 2019 his symptoms appear to have settled down this year.   His asthma is under good control We will continue to monitor We had discussed biologics in the past but since he is better we will defer this therapy.  Plan/Recommendations: - Continue Breo, Spiriva  Follow-up in 6 months.  [name] MD [facility] 02/12/2019, 11:16 AM  CC: [name], MD

## 2019-02-12 NOTE — Patient Instructions (Signed)
I am glad you are doing well with regard to your breathing.  Continue your inhalers as prescribed Follow-up in 6 months.  Please give [place] a call back if there is any change in your symptoms

## 2019-03-05 LAB — PSA: PSA: 1.52

## 2019-03-17 ENCOUNTER — Encounter: Payer: Self-pay | Admitting: Family Medicine

## 2019-03-18 ENCOUNTER — Other Ambulatory Visit: Payer: Self-pay | Admitting: Family Medicine

## 2019-05-14 ENCOUNTER — Other Ambulatory Visit: Payer: Self-pay | Admitting: Family Medicine

## 2019-06-08 ENCOUNTER — Encounter: Payer: Self-pay | Admitting: Family Medicine

## 2019-06-10 ENCOUNTER — Encounter: Payer: Self-pay | Admitting: Family Medicine

## 2019-06-10 ENCOUNTER — Ambulatory Visit (INDEPENDENT_AMBULATORY_CARE_PROVIDER_SITE_OTHER): Payer: 59 | Admitting: Family Medicine

## 2019-06-10 VITALS — Wt 195.0 lb

## 2019-06-10 DIAGNOSIS — B9689 Other specified bacterial agents as the cause of diseases classified elsewhere: Secondary | ICD-10-CM

## 2019-06-10 DIAGNOSIS — R059 Cough, unspecified: Secondary | ICD-10-CM

## 2019-06-10 DIAGNOSIS — J329 Chronic sinusitis, unspecified: Secondary | ICD-10-CM | POA: Diagnosis not present

## 2019-06-10 DIAGNOSIS — J4550 Severe persistent asthma, uncomplicated: Secondary | ICD-10-CM

## 2019-06-10 DIAGNOSIS — R05 Cough: Secondary | ICD-10-CM | POA: Diagnosis not present

## 2019-06-10 MED ORDER — BENZONATATE 100 MG PO CAPS: 100.0000 mg | ORAL_CAPSULE | Freq: Two times a day (BID) | ORAL | 1 refills | Status: DC | PRN

## 2019-06-10 MED ORDER — AZITHROMYCIN 250 MG PO TABS: ORAL_TABLET | ORAL | 0 refills | Status: DC

## 2019-06-10 NOTE — Progress Notes (Signed)
Phone [phone] Virtual visit via Video note   Subjective:  Chief complaint: Chief Complaint  Patient presents with  . Follow-up    This visit type was conducted due to national recommendations for restrictions regarding the COVID-19 Pandemic (e.g. social distancing).  This format is felt to be most appropriate for this patient at this time balancing risks to patient and risks to population by having him in for in person visit.  No physical exam was performed (except for noted visual exam or audio findings with Telehealth visits).    Our team/I connected with Latricia Heft Blackston at  1:40 PM EST by a video enabled telemedicine application (doxy.me or caregility through epic) and verified that I am speaking with the correct person using two identifiers.  Location patient: [place] Location provider: [facility], office Persons participating in the virtual visit:  patient  Our team/I discussed the limitations of evaluation and management by telemedicine and the availability of in person appointments. In light of current covid-19 pandemic, patient also understands that we are trying to protect them by minimizing in office contact if at all possible.  The patient expressed consent for telemedicine visit and agreed to proceed. Patient understands insurance will be billed.   ROS-no fever/chills/nausea/vomiting.  Denies wheeze or shortness of breath.  Past Medical History-  Patient Active Problem List   Diagnosis Date Noted  . Severe persistent asthma without complication [id number]    Priority: High  . Malignant neoplasm of prostate ([place]) 04/26/2015    Priority: High  . Chronic low back pain 01/10/2017    Priority: Medium  . Urinary frequency 11/28/2011    Priority: Medium  . Hyperlipidemia 03/21/2011    Priority: Medium  . PTSD (post-traumatic stress disorder) 09/06/2010    Priority: Medium  . Other allergic rhinitis 11/17/2018    Priority: Low  . IBS (irritable bowel syndrome)    Priority: Low  . Mild persistent asthma without complication 0000000    Medications- reviewed and updated Current Outpatient Medications  Medication Sig Dispense Refill  . albuterol (PROVENTIL HFA;VENTOLIN HFA) 108 (90 Base) MCG/ACT inhaler Inhale 2 puffs into the lungs every 6 (six) hours as needed for wheezing or shortness of breath. 1 Inhaler 2  . benzonatate (TESSALON) 100 MG capsule Take 1 capsule (100 mg total) by mouth 2 (two) times daily as needed for cough. 40 capsule 1  . citalopram (CELEXA) 40 MG tablet TAKE 1 TABLET DAILY 90 tablet 3  . Fexofenadine-Pseudoephedrine (ALLEGRA-D 12 HOUR PO) Take by mouth. Patient is taking daily.    . fluticasone furoate-vilanterol (BREO ELLIPTA) 200-25 MCG/INH AEPB Inhale 1 puff into the lungs daily. 180 each 3  . sildenafil (REVATIO) 20 MG tablet Take 20 mg by mouth as needed (2 -5 tabs as needed).    . simvastatin (ZOCOR) 20 MG tablet TAKE 1 TABLET AT BEDTIME 90 tablet 2  . Tiotropium Bromide Monohydrate (SPIRIVA RESPIMAT) 2.5 MCG/ACT AERS Inhale 2 puffs into the lungs daily. 3 Inhaler 3  . XHANCE 93 MCG/ACT EXHU BLOW 2 DOSES IN EACH NOSTRIL TWICE DAILY 32 mL 12  . azithromycin (ZITHROMAX) 250 MG tablet Take 2 tabs daily for 3 days 6 tablet 0   No current facility-administered medications for this visit.      Objective:  Wt 195 lb (88.5 kg)   BMI 25.04 kg/m  self reported vitals Gen: NAD, resting comfortably Lungs: nonlabored, normal respiratory rate  Skin: appears dry, no obvious rash    Assessment and Plan   #  Cough S: Patient reports on and off cough throughout most of the year.  He has had some improvement and then worsening. In the spring took a azithromycin 3 day regimen in April and that helped pretty good. Did not have to use to do prednisone  About 3 weeks ago had woodfloors refinished 3 weeks ago- started up with more nasal pressure/congestion again. He could feel sinus pressure coming back and symptoms lasted over 10  days. He had a lingering z-pack from the spring and sinus pressure improved. Feels like getting better but getting some drainage/tickle in his throat with associated with cough. Tessalon perles help him sleep at night- has about 7 left and wants to have refill to help.    No current wheezing/shortness of breath to suggest asthma exacerbation and he is compliant with his baseline asthma medicines-has not had increased use of rescue inhaler A/P: Sounds like patient had an episode of sinusitis which improved with azithromycin-he requests to have a refill available but agrees to only use this if symptoms last over 10 days.  He also understands if has recurrent issues that we would need to talk about COVID-19 testing.  Since Tessalon gives him a lot of help at night-did refill this.   No evidence of asthma exacerbation.  Continue chronic asthma medications-appears to have stable severe persistent asthma  I did tell him given current cough I would suggest COVID-19 test-he agrees to go on Friday if our COVID-19 testing center is open-otherwise can do next Monday.  He needs to self quarantine until has a negative test result.  He understands to seek care if he has worsening shortness of breath.  Likely no known contacts with COVID-19 and he has been pretty careful-his son had a possible contact with COVID-19 as well as his wife but they both tested negative  Lab/Order associations:   ICD-10-CM   1. Cough  R05 Novel Coronavirus, NAA ([facility])  2. Bacterial sinusitis  J32.9    B96.89   3. Severe persistent asthma without complication  123XX123     Meds ordered this encounter  Medications  . benzonatate (TESSALON) 100 MG capsule    Sig: Take 1 capsule (100 mg total) by mouth 2 (two) times daily as needed for cough.    Dispense:  40 capsule    Refill:  1  . azithromycin (ZITHROMAX) 250 MG tablet    Sig: Take 2 tabs daily for 3 days    Dispense:  6 tablet    Refill:  0    Return precautions advised.   [name], MD

## 2019-06-10 NOTE — Patient Instructions (Signed)
There are no preventive care reminders to display for this patient. Depression screen [facility] 2/9 07/25/2018 04/02/2018 03/11/2017  Decreased Interest 0 0 0  Down, Depressed, Hopeless 0 0 0  PHQ - 2 Score 0 0 0

## 2019-06-17 ENCOUNTER — Other Ambulatory Visit: Payer: Self-pay

## 2019-06-17 DIAGNOSIS — Z20822 Contact with and (suspected) exposure to covid-19: Secondary | ICD-10-CM

## 2019-06-19 LAB — NOVEL CORONAVIRUS, NAA: SARS-CoV-2, NAA: NOT DETECTED

## 2019-07-02 ENCOUNTER — Other Ambulatory Visit: Payer: Self-pay | Admitting: Urology

## 2019-07-02 DIAGNOSIS — C61 Malignant neoplasm of prostate: Secondary | ICD-10-CM

## 2019-08-04 ENCOUNTER — Other Ambulatory Visit: Payer: Self-pay | Admitting: Family Medicine

## 2019-08-04 ENCOUNTER — Ambulatory Visit (INDEPENDENT_AMBULATORY_CARE_PROVIDER_SITE_OTHER): Payer: Managed Care, Other (non HMO) | Admitting: Family Medicine

## 2019-08-04 ENCOUNTER — Encounter: Payer: Self-pay | Admitting: Family Medicine

## 2019-08-04 ENCOUNTER — Encounter (HOSPITAL_COMMUNITY): Payer: Self-pay | Admitting: Family Medicine

## 2019-08-04 ENCOUNTER — Other Ambulatory Visit: Payer: Self-pay

## 2019-08-04 ENCOUNTER — Emergency Department (HOSPITAL_COMMUNITY): Payer: Managed Care, Other (non HMO)

## 2019-08-04 VITALS — BP 112/68 | HR 81 | Temp 97.3°F | Ht 74.0 in | Wt 191.0 lb

## 2019-08-04 DIAGNOSIS — R0602 Shortness of breath: Secondary | ICD-10-CM

## 2019-08-04 DIAGNOSIS — J4550 Severe persistent asthma, uncomplicated: Secondary | ICD-10-CM

## 2019-08-04 DIAGNOSIS — Z7951 Long term (current) use of inhaled steroids: Secondary | ICD-10-CM

## 2019-08-04 DIAGNOSIS — J9601 Acute respiratory failure with hypoxia: Secondary | ICD-10-CM | POA: Diagnosis present

## 2019-08-04 DIAGNOSIS — N4889 Other specified disorders of penis: Secondary | ICD-10-CM

## 2019-08-04 DIAGNOSIS — J189 Pneumonia, unspecified organism: Principal | ICD-10-CM | POA: Diagnosis present

## 2019-08-04 DIAGNOSIS — Z20822 Contact with and (suspected) exposure to covid-19: Secondary | ICD-10-CM | POA: Diagnosis present

## 2019-08-04 DIAGNOSIS — B356 Tinea cruris: Secondary | ICD-10-CM | POA: Diagnosis present

## 2019-08-04 DIAGNOSIS — Z8546 Personal history of malignant neoplasm of prostate: Secondary | ICD-10-CM

## 2019-08-04 DIAGNOSIS — N5089 Other specified disorders of the male genital organs: Secondary | ICD-10-CM | POA: Diagnosis not present

## 2019-08-04 DIAGNOSIS — Z8042 Family history of malignant neoplasm of prostate: Secondary | ICD-10-CM

## 2019-08-04 DIAGNOSIS — E785 Hyperlipidemia, unspecified: Secondary | ICD-10-CM | POA: Diagnosis present

## 2019-08-04 DIAGNOSIS — Z79899 Other long term (current) drug therapy: Secondary | ICD-10-CM

## 2019-08-04 LAB — BASIC METABOLIC PANEL
Anion gap: 10 (ref 5–15)
BUN: 19 mg/dL (ref 8–23)
CO2: 26 mmol/L (ref 22–32)
Calcium: 9.6 mg/dL (ref 8.9–10.3)
Chloride: 96 mmol/L — ABNORMAL LOW (ref 98–111)
Creatinine, Ser: 1.08 mg/dL (ref 0.61–1.24)
GFR calc Af Amer: >60 mL/min (ref >60)
GFR calc non Af Amer: >60 mL/min (ref >60)
Glucose, Bld: 124 mg/dL — ABNORMAL HIGH (ref 70–99)
Potassium: 4.1 mmol/L (ref 3.5–5.1)
Sodium: 132 mmol/L — ABNORMAL LOW (ref 135–145)

## 2019-08-04 LAB — URINALYSIS, ROUTINE W REFLEX MICROSCOPIC
Bilirubin Urine: NEGATIVE
Glucose, UA: NEGATIVE mg/dL
Hgb urine dipstick: NEGATIVE
Ketones, ur: NEGATIVE mg/dL
Leukocytes,Ua: NEGATIVE
Nitrite: NEGATIVE
Protein, ur: NEGATIVE mg/dL
Specific Gravity, Urine: 1.01 (ref 1.005–1.030)
pH: 6 (ref 5.0–8.0)

## 2019-08-04 LAB — CBC
HCT: 37.9 % — ABNORMAL LOW (ref 39.0–52.0)
Hemoglobin: 12.5 g/dL — ABNORMAL LOW (ref 13.0–17.0)
MCH: 29.3 pg (ref 26.0–34.0)
MCHC: 33 g/dL (ref 30.0–36.0)
MCV: 88.8 fL (ref 80.0–100.0)
Platelets: 424 10*3/uL — ABNORMAL HIGH (ref 150–400)
RBC: 4.27 MIL/uL (ref 4.22–5.81)
RDW: 11.9 % (ref 11.5–15.5)
WBC: 20.2 10*3/uL — ABNORMAL HIGH (ref 4.0–10.5)
nRBC: 0 % (ref 0.0–0.2)

## 2019-08-04 LAB — D-DIMER, QUANTITATIVE: D-Dimer, Quant: 6.57 ug{FEU}/mL — ABNORMAL HIGH

## 2019-08-04 MED ORDER — PREDNISONE 20 MG PO TABS: ORAL_TABLET | ORAL | 0 refills | Status: DC

## 2019-08-04 MED ORDER — ALBUTEROL SULFATE HFA 108 (90 BASE) MCG/ACT IN AERS: 2.0000 | INHALATION_SPRAY | Freq: Four times a day (QID) | RESPIRATORY_TRACT | 2 refills | Status: DC | PRN

## 2019-08-04 MED ORDER — AZITHROMYCIN 250 MG PO TABS: ORAL_TABLET | ORAL | 0 refills | Status: DC

## 2019-08-04 NOTE — ED Notes (Signed)
Sent additional tubes to lab.

## 2019-08-04 NOTE — ED Notes (Signed)
Consulted with Dr. Zenia Resides and Fredderick Phenix. PA about patients condition and prior PCP notes from [place]. Fredderick Phenix, PA placed orders until a room is available for patient.

## 2019-08-04 NOTE — ED Triage Notes (Signed)
Patient was seen by Dr. [name] for shortness of breath and scrotal and penis swelling. He has an elevated D-dimer and PCP requested him be seen in ED for CT angiogram and COVID-testing. Also, recommended ED provider decide about abd imaging for swelling and pain.

## 2019-08-04 NOTE — Patient Instructions (Addendum)
We will draw labs in room and exit through side door.   Course of prednisone to treat potential asthma flare  With low oxygen doing d dimer test- if this is elevated we will need to send you to emergency room to rule out blood clot in lungs  With scrotum and penis swelling improving- opted to see if prednisone would help further. Get ultrasound of scrotum. Considered ct scan. Will have to have negative covid test before allowed in their facilities  Patient with symptoms concerning for possiblecovid 19 Therefore: -information provided on testing scheduling "please text "COVID" to [phone], OR you can log on to [email or web address] to easily make an on-line appointment. "  - recommended patient watch closely for shortness of breath worsening or confusion or worsening symptoms and if those occur he should contact us immediately. Since lung symptoms stable over last week- we did not send him today.  -recommended patient consider purchasing pulse oximeter and if levels 89% or below persistently- seek care at the hospital  -recommended self isolation until negative test  at minimum . Also discussed potential for false negatives and to still be cautious even with negative test  - for quarantine if covid 19 test positive would need to be at least 10 days since first symptom AND at least 24 hours fever free without fever reducing medications AND improvement in respiratory symptoms  - we also discussed close contacts would need 14 day quarantine after last close contact with patient IF patient is positive  -Hopeful results back within 48 hours of test   ER follow up if any new or worsening symptoms

## 2019-08-04 NOTE — Progress Notes (Signed)
Phone [phone] In person visit  -Addendum in regards to shortness of breath: Patient with positive D-dimer and was experiencing shortness of breath and oxygen level at 90%. I called patient and urgently requested he go to the emergency room. We discussed my request for CT angiogram as well as COVID-19 testing. - does have prostate cancer under active surveillance but not sure this would cause elevated D-dimer at this level   -Addendum for scrotal and penis swelling-initially planned for scrotal ultrasound. Given elevated D-dimer this also makes me more concerned about potential obstruction causing scrotal and penis edema-sending patient to the emergency room as above-would ask for emergency doctors opinion on abdominal imaging to evaluate potential vascular obstruction    Subjective:   Edward Lee is a 64 y.o. year old very pleasant male patient who presents for/with See problem oriented charting Chief Complaint  Patient presents with  . Groin Swelling  . Asthma    Pt has had trouble breathing in the last week, using his inaler 2 times daily.    This visit occurred during the SARS-CoV-2 public health emergency.  Safety protocols were in place, including screening questions prior to the visit, additional usage of staff PPE, and extensive cleaning of exam room while observing appropriate contact time as indicated for disinfecting solutions.   Past Medical History-  Patient Active Problem List   Diagnosis Date Noted  . Severe persistent asthma without complication [id number]    Priority: High  . Malignant neoplasm of prostate ([place]) 04/26/2015    Priority: High  . Chronic low back pain 01/10/2017    Priority: Medium  . Urinary frequency 11/28/2011    Priority: Medium  . Hyperlipidemia 03/21/2011    Priority: Medium  . PTSD (post-traumatic stress disorder) 09/06/2010    Priority: Medium  . Recurrent maxillary sinusitis 11/17/2018    Priority: Low  . Other allergic rhinitis  11/17/2018    Priority: Low  . IBS (irritable bowel syndrome)     Priority: Low  . Mild persistent asthma without complication 0000000    Medications- reviewed and updated Current Outpatient Medications  Medication Sig Dispense Refill  . albuterol (VENTOLIN HFA) 108 (90 Base) MCG/ACT inhaler Inhale 2 puffs into the lungs every 6 (six) hours as needed for wheezing or shortness of breath. 18 g 2  . benzonatate (TESSALON) 100 MG capsule Take 1 capsule (100 mg total) by mouth 2 (two) times daily as needed for cough. 40 capsule 1  . citalopram (CELEXA) 40 MG tablet TAKE 1 TABLET DAILY 90 tablet 3  . Fexofenadine-Pseudoephedrine (ALLEGRA-D 12 HOUR PO) Take by mouth. Patient is taking daily.    . fluticasone furoate-vilanterol (BREO ELLIPTA) 200-25 MCG/INH AEPB Inhale 1 puff into the lungs daily. 180 each 3  . sildenafil (REVATIO) 20 MG tablet Take 20 mg by mouth as needed (2 -5 tabs as needed).    . simvastatin (ZOCOR) 20 MG tablet TAKE 1 TABLET AT BEDTIME 90 tablet 2  . Tiotropium Bromide Monohydrate (SPIRIVA RESPIMAT) 2.5 MCG/ACT AERS Inhale 2 puffs into the lungs daily. 3 Inhaler 3  . XHANCE 93 MCG/ACT EXHU BLOW 2 DOSES IN EACH NOSTRIL TWICE DAILY 32 mL 12  . azithromycin (ZITHROMAX) 250 MG tablet Take 2 tabs daily for 3 days 6 tablet 0  . predniSONE (DELTASONE) 20 MG tablet Take 2 pills for 3 days, 1 pill for 4 days 10 tablet 0   No current facility-administered medications for this visit.     Objective:  BP 112/68  Pulse 81   Temp (!) 97.3 F (36.3 C) (Temporal)   Ht 6\' 2"  (1.88 m)   Wt 191 lb (86.6 kg)   SpO2 90%   BMI 24.52 kg/m  Gen: NAD, resting comfortably CV: RRR no murmurs rubs or gallops Lungs: CTAB no crackles, wheeze, rhonchi, appears winded with exertion in the office-had recently used albuterol and did not hear wheezing Abdomen: soft/nontender/nondistended GU: Significant swelling of bilateral scrotum with very mild erythema on scrotum itself. Testicles not  tender and not obviously enlarged-does seem to have extra fluid within the scrotum itself-spermatic cords appear enlarged Skin: warm, dry    Assessment and Plan   #Shortness of breath/wheezing S: Patient with a history of severe persistent asthma.  He was screened for COVID-19 before coming into the office but answered now as he thought his symptoms were all related to asthma history.  Has also had recurrent sinus issues.  He reports a week ago he noted worsening sinus pressure and tension.  When his sinuses feel full, his skin gets flushed.  After a week of symptoms he tried azithromycin from a prior prescription.  He feels like he has improved some since that time.  He is also been using Allegra-D, ibuprofen for muscular pain, Advil Cold and Sinus.  He also has felt short of breath over the last week.  Albuterol helps but only mildly.  Usually can walk 5 miles but feels winded with walking short distances.  Has noted more wheezing.  He has not been tested for COVID-19 since November.  He has tried to be very cautious at home and has only left the home to the grocery store in the last 3 weeks-they cut out doing other activities before Christmas including [employer] which he used for fitness.  No history of blood clots in lungs or legs-no DVT/PE.  No calf or abdominal swelling reported or calf pain. A/P: Patient initially reported for scrotal penis and swelling but also at time of visit reported significant shortness of breath and wheezing as well as sinus issues that have mildly improved on azithromycin which he had at home. -Significant shortness of breath which has made him only be able to walk short distances instead of the 5 miles he can usually walk also associated with wheezing -Some relief with albuterol but not as significant as in the past. We opted to treat with a course of prednisone -Also obtained stat D-dimer and discussed would need to proceed to emergency room if positive -Information given  about COVID-19 testing and precautions until has negative test-see after visit summary -Addendum in regards to shortness of breath: Patient with positive D-dimer and was experiencing shortness of breath and oxygen level at 90%. I called patient and urgently requested he go to the emergency room. We discussed my request for CT angiogram as well as COVID-19 testing.   #Scrotal and penis swelling S: Patient reports yesterday he noted some itchiness in his crotch.  He has had jock itch in the past and wondered since he has slight sweating at night if this could be recurrence.  Then he pulled his pants down and noted significant scrotal swelling and also swelling into his penis.  He took a shower and afterwards sprayed some Tinactin on.  He noted that the area appeared mildly red.  After that point itching decreased and his swelling has slightly improved from that time.  Denies any burning with peeing/penile discharge/fever/chills/expanding redness.  As noted symptoms seem to be improving. A/P: I am  unsure what is causing scrotal and penis swelling. Given improvement with Tinactin I encouraged him to continue this. I wonder if there is a potential irritant/contact dermatitis but no obvious new contacts in this region. We opted to try prednisone for potential asthma flare and I also wonder if this will help swelling -No pain but we opted to get an ultrasound of the scrotum for more information -CBC and CMP obtained-evaluate for anemia or electrolyte abnormalities or signs of cirrhosis though doubt these as cause of swelling -Addendum-initially planned for scrotal ultrasound. Given elevated D-dimer this also makes me more concerned about potential obstruction causing scrotal and penis edema-sending patient to the emergency room as above-would ask for emergency doctors opinion on abdominal imaging to evaluate potential vascular obstruction  Recommended follow up: He may cancel COVID-19 scheduled testing if tested  in the emergency room for COVID-19 Future Appointments  Date Time Provider [place]  08/07/2019  1:00 PM [place]: [address] RD, [place] [facility]    Lab/Order associations:   ICD-10-CM   1. Shortness of breath  R06.02 CBC with Differential/Platelet    Comprehensive metabolic panel    D-Dimer, Quantitative  2. Scrotum swelling  N50.89 US SCROTUM W/DOPPLER  3. Swelling of penis  N48.89 US SCROTUM W/DOPPLER  4. Severe persistent asthma without complication  [id number]     Meds ordered this encounter  Medications  . predniSONE (DELTASONE) 20 MG tablet    Sig: Take 2 pills for 3 days, 1 pill for 4 days    Dispense:  10 tablet    Refill:  0  . albuterol (VENTOLIN HFA) 108 (90 Base) MCG/ACT inhaler    Sig: Inhale 2 puffs into the lungs every 6 (six) hours as needed for wheezing or shortness of breath.    Dispense:  18 g    Refill:  2  . azithromycin (ZITHROMAX) 250 MG tablet    Sig: Take 2 tabs daily for 3 days    Dispense:  6 tablet    Refill:  0  He requests azithromycin to have on hand for sinus infections lasting over 10 days if occurs on the weekend-I refilled this but strongly requested he keep [place] informed if he plans to use/we can help him with decision-making  Time Spent: 68 minutes of total time (3:46 PM- 4:32 PM,9:58 PM-10:20 PM) was spent on the date of the encounter performing the following actions: chart review prior to seeing the patient, obtaining history, performing a medically necessary exam, counseling on the treatment plan, placing orders, and documenting in our EHR.   Return precautions advised.  [name], MD

## 2019-08-05 ENCOUNTER — Other Ambulatory Visit: Payer: Self-pay

## 2019-08-05 ENCOUNTER — Emergency Department (HOSPITAL_COMMUNITY): Payer: Managed Care, Other (non HMO)

## 2019-08-05 ENCOUNTER — Telehealth: Payer: Self-pay

## 2019-08-05 ENCOUNTER — Encounter (HOSPITAL_COMMUNITY): Payer: Self-pay | Admitting: Emergency Medicine

## 2019-08-05 ENCOUNTER — Telehealth: Payer: Self-pay | Admitting: Family Medicine

## 2019-08-05 ENCOUNTER — Other Ambulatory Visit (HOSPITAL_COMMUNITY): Payer: Self-pay

## 2019-08-05 ENCOUNTER — Inpatient Hospital Stay (HOSPITAL_COMMUNITY)
Admission: EM | Admit: 2019-08-05 | Discharge: 2019-08-06 | DRG: 193 | Disposition: A | Discharge status: Home / Self Care | Payer: Managed Care, Other (non HMO) | Attending: Internal Medicine | Admitting: Internal Medicine

## 2019-08-05 DIAGNOSIS — Z79899 Other long term (current) drug therapy: Secondary | ICD-10-CM | POA: Diagnosis not present

## 2019-08-05 DIAGNOSIS — N5089 Other specified disorders of the male genital organs: Secondary | ICD-10-CM | POA: Diagnosis present

## 2019-08-05 DIAGNOSIS — J188 Other pneumonia, unspecified organism: Secondary | ICD-10-CM | POA: Diagnosis present

## 2019-08-05 DIAGNOSIS — Z7951 Long term (current) use of inhaled steroids: Secondary | ICD-10-CM | POA: Diagnosis not present

## 2019-08-05 DIAGNOSIS — Z20822 Contact with and (suspected) exposure to covid-19: Secondary | ICD-10-CM | POA: Diagnosis present

## 2019-08-05 DIAGNOSIS — Z8042 Family history of malignant neoplasm of prostate: Secondary | ICD-10-CM | POA: Diagnosis not present

## 2019-08-05 DIAGNOSIS — J189 Pneumonia, unspecified organism: Principal | ICD-10-CM

## 2019-08-05 DIAGNOSIS — B356 Tinea cruris: Secondary | ICD-10-CM | POA: Diagnosis present

## 2019-08-05 DIAGNOSIS — Z8546 Personal history of malignant neoplasm of prostate: Secondary | ICD-10-CM | POA: Diagnosis not present

## 2019-08-05 DIAGNOSIS — R0902 Hypoxemia: Secondary | ICD-10-CM

## 2019-08-05 DIAGNOSIS — R0602 Shortness of breath: Secondary | ICD-10-CM | POA: Diagnosis present

## 2019-08-05 DIAGNOSIS — J9601 Acute respiratory failure with hypoxia: Secondary | ICD-10-CM | POA: Diagnosis present

## 2019-08-05 DIAGNOSIS — E785 Hyperlipidemia, unspecified: Secondary | ICD-10-CM | POA: Diagnosis present

## 2019-08-05 LAB — I-STAT CHEM 8, ED
BUN: 18 mg/dL (ref 8–23)
Calcium, Ion: 1.18 mmol/L (ref 1.15–1.40)
Chloride: 96 mmol/L — ABNORMAL LOW (ref 98–111)
Creatinine, Ser: 1 mg/dL (ref 0.61–1.24)
Glucose, Bld: 103 mg/dL — ABNORMAL HIGH (ref 70–99)
HCT: 37 % — ABNORMAL LOW (ref 39.0–52.0)
Hemoglobin: 12.6 g/dL — ABNORMAL LOW (ref 13.0–17.0)
Potassium: 4.1 mmol/L (ref 3.5–5.1)
Sodium: 132 mmol/L — ABNORMAL LOW (ref 135–145)
TCO2: 27 mmol/L (ref 22–32)

## 2019-08-05 LAB — COMPREHENSIVE METABOLIC PANEL
ALT: 12 U/L (ref 0–53)
AST: 20 U/L (ref 0–37)
Albumin: 3.4 g/dL — ABNORMAL LOW (ref 3.5–5.2)
Alkaline Phosphatase: 53 U/L (ref 39–117)
BUN: 18 mg/dL (ref 6–23)
CO2: 28 mEq/L (ref 19–32)
Calcium: 9.6 mg/dL (ref 8.4–10.5)
Chloride: 94 mEq/L — ABNORMAL LOW (ref 96–112)
Creatinine, Ser: 0.91 mg/dL (ref 0.40–1.50)
GFR: 83.55 mL/min (ref >60.00)
Glucose, Bld: 108 mg/dL — ABNORMAL HIGH (ref 70–99)
Potassium: 4.4 mEq/L (ref 3.5–5.1)
Sodium: 131 mEq/L — ABNORMAL LOW (ref 135–145)
Total Bilirubin: 0.4 mg/dL (ref 0.2–1.2)
Total Protein: 7.5 g/dL (ref 6.0–8.3)

## 2019-08-05 LAB — CBC WITH DIFFERENTIAL/PLATELET
Basophils Absolute: 0.1 10*3/uL (ref 0.0–0.1)
Basophils Relative: 0.5 % (ref 0.0–3.0)
Eosinophils Absolute: 7 10*3/uL — ABNORMAL HIGH (ref 0.0–0.7)
Eosinophils Relative: 38.1 % — ABNORMAL HIGH (ref 0.0–5.0)
HCT: 38 % — ABNORMAL LOW (ref 39.0–52.0)
Hemoglobin: 12.5 g/dL — ABNORMAL LOW (ref 13.0–17.0)
Lymphocytes Relative: 6.9 % — ABNORMAL LOW (ref 12.0–46.0)
Lymphs Abs: 1.3 10*3/uL (ref 0.7–4.0)
MCHC: 33 g/dL (ref 30.0–36.0)
MCV: 88.4 fl (ref 78.0–100.0)
Monocytes Absolute: 0.6 10*3/uL (ref 0.1–1.0)
Monocytes Relative: 3.5 % (ref 3.0–12.0)
Neutro Abs: 9.3 10*3/uL — ABNORMAL HIGH (ref 1.4–7.7)
Neutrophils Relative %: 51 % (ref 43.0–77.0)
Platelets: 522 10*3/uL — ABNORMAL HIGH (ref 150.0–400.0)
RBC: 4.29 Mil/uL (ref 4.22–5.81)
RDW: 12.3 % (ref 11.5–15.5)
WBC: 18.2 10*3/uL (ref 4.0–10.5)

## 2019-08-05 LAB — RESPIRATORY PANEL BY PCR

## 2019-08-05 LAB — POC SARS CORONAVIRUS 2 AG -  ED: SARS Coronavirus 2 Ag: NEGATIVE

## 2019-08-05 LAB — HIV ANTIBODY (ROUTINE TESTING W REFLEX): HIV Screen 4th Generation wRfx: NONREACTIVE

## 2019-08-05 LAB — TROPONIN I (HIGH SENSITIVITY)
Troponin I (High Sensitivity): 3 ng/L (ref <18)
Troponin I (High Sensitivity): <2 ng/L (ref <18)

## 2019-08-05 LAB — FIBRINOGEN: Fibrinogen: 732 mg/dL — ABNORMAL HIGH (ref 210–475)

## 2019-08-05 LAB — TRIGLYCERIDES: Triglycerides: 76 mg/dL (ref <150)

## 2019-08-05 LAB — C-REACTIVE PROTEIN: CRP: 9.1 mg/dL — ABNORMAL HIGH (ref <1.0)

## 2019-08-05 LAB — LACTATE DEHYDROGENASE: LDH: 184 U/L (ref 98–192)

## 2019-08-05 LAB — FERRITIN: Ferritin: 265 ng/mL (ref 24–336)

## 2019-08-05 LAB — SARS CORONAVIRUS 2 (TAT 6-24 HRS): SARS Coronavirus 2: NEGATIVE

## 2019-08-05 LAB — LACTIC ACID, PLASMA: Lactic Acid, Venous: 1 mmol/L (ref 0.5–1.9)

## 2019-08-05 LAB — PROCALCITONIN: Procalcitonin: 0.1 ng/mL

## 2019-08-05 MED ORDER — IBUPROFEN 200 MG PO TABS
400.0000 mg | ORAL_TABLET | Freq: Three times a day (TID) | ORAL | Status: AC
  Administered 2019-08-05: 400 mg via ORAL
  Filled 2019-08-05: qty 2

## 2019-08-05 MED ORDER — NYSTATIN 100000 UNIT/GM EX CREA
TOPICAL_CREAM | Freq: Three times a day (TID) | CUTANEOUS | Status: DC
  Administered 2019-08-05: 1 via TOPICAL
  Filled 2019-08-05: qty 15

## 2019-08-05 MED ORDER — UMECLIDINIUM BROMIDE 62.5 MCG/INH IN AEPB
1.0000 | INHALATION_SPRAY | Freq: Every day | RESPIRATORY_TRACT | Status: DC
  Administered 2019-08-05 – 2019-08-06 (×2): 1 via RESPIRATORY_TRACT
  Filled 2019-08-05: qty 7

## 2019-08-05 MED ORDER — ADULT MULTIVITAMIN W/MINERALS CH
1.0000 | ORAL_TABLET | Freq: Every day | ORAL | Status: DC
  Administered 2019-08-05 – 2019-08-06 (×2): 1 via ORAL
  Filled 2019-08-05 (×2): qty 1

## 2019-08-05 MED ORDER — SODIUM CHLORIDE (PF) 0.9 % IJ SOLN
INTRAMUSCULAR | Status: AC
  Filled 2019-08-05: qty 50

## 2019-08-05 MED ORDER — IOHEXOL 350 MG/ML SOLN
100.0000 mL | Freq: Once | INTRAVENOUS | Status: AC | PRN
  Administered 2019-08-05: 100 mL via INTRAVENOUS

## 2019-08-05 MED ORDER — DEXAMETHASONE 4 MG PO TABS
6.0000 mg | ORAL_TABLET | Freq: Every day | ORAL | Status: DC
  Administered 2019-08-05: 6 mg via ORAL

## 2019-08-05 MED ORDER — SODIUM CHLORIDE 0.9 % IV SOLN
500.0000 mg | INTRAVENOUS | Status: DC
  Administered 2019-08-06: 500 mg via INTRAVENOUS
  Filled 2019-08-05: qty 500

## 2019-08-05 MED ORDER — FLUTICASONE FUROATE-VILANTEROL 200-25 MCG/INH IN AEPB
1.0000 | INHALATION_SPRAY | Freq: Every day | RESPIRATORY_TRACT | Status: DC
  Administered 2019-08-05 – 2019-08-06 (×2): 1 via RESPIRATORY_TRACT
  Filled 2019-08-05: qty 28

## 2019-08-05 MED ORDER — CITALOPRAM HYDROBROMIDE 20 MG PO TABS
40.0000 mg | ORAL_TABLET | Freq: Every day | ORAL | Status: DC
  Administered 2019-08-05 – 2019-08-06 (×2): 40 mg via ORAL
  Filled 2019-08-05: qty 4
  Filled 2019-08-05: qty 2

## 2019-08-05 MED ORDER — SODIUM CHLORIDE 0.9 % IV SOLN: 2.0000 g | Freq: Every day | INTRAVENOUS | Status: DC

## 2019-08-05 MED ORDER — DEXAMETHASONE SODIUM PHOSPHATE 10 MG/ML IJ SOLN
6.0000 mg | Freq: Once | INTRAMUSCULAR | Status: AC
  Administered 2019-08-05: 6 mg via INTRAVENOUS
  Filled 2019-08-05: qty 1

## 2019-08-05 MED ORDER — SODIUM CHLORIDE 0.9 % IV SOLN
2.0000 g | Freq: Every day | INTRAVENOUS | Status: DC
  Administered 2019-08-05 – 2019-08-06 (×2): 2 g via INTRAVENOUS
  Filled 2019-08-05: qty 2
  Filled 2019-08-05: qty 20

## 2019-08-05 MED ORDER — TIOTROPIUM BROMIDE MONOHYDRATE 2.5 MCG/ACT IN AERS: 2.0000 | INHALATION_SPRAY | Freq: Every day | RESPIRATORY_TRACT | Status: DC

## 2019-08-05 MED ORDER — ALBUTEROL SULFATE HFA 108 (90 BASE) MCG/ACT IN AERS
2.0000 | INHALATION_SPRAY | Freq: Four times a day (QID) | RESPIRATORY_TRACT | Status: DC | PRN
  Filled 2019-08-05: qty 6.7

## 2019-08-05 MED ORDER — ENOXAPARIN SODIUM 40 MG/0.4ML ~~LOC~~ SOLN
40.0000 mg | Freq: Every day | SUBCUTANEOUS | Status: DC
  Administered 2019-08-05: 40 mg via SUBCUTANEOUS
  Filled 2019-08-05 (×2): qty 0.4

## 2019-08-05 MED ORDER — FLUTICASONE PROPIONATE 50 MCG/ACT NA SUSP: 2.0000 | Freq: Two times a day (BID) | NASAL | Status: DC | PRN

## 2019-08-05 MED ORDER — SODIUM CHLORIDE 0.9 % IV SOLN
500.0000 mg | Freq: Once | INTRAVENOUS | Status: AC
  Administered 2019-08-05: 500 mg via INTRAVENOUS
  Filled 2019-08-05: qty 500

## 2019-08-05 MED ORDER — SIMVASTATIN 10 MG PO TABS
20.0000 mg | ORAL_TABLET | Freq: Every day | ORAL | Status: DC
  Administered 2019-08-05: 20 mg via ORAL
  Filled 2019-08-05: qty 2
  Filled 2019-08-05: qty 1

## 2019-08-05 MED ORDER — SODIUM CHLORIDE 0.9 % IV SOLN
1.0000 g | Freq: Once | INTRAVENOUS | Status: AC
  Administered 2019-08-05: 1 g via INTRAVENOUS
  Filled 2019-08-05: qty 10

## 2019-08-05 NOTE — H&P (Signed)
History and Physical    KAUSHAL DIRICO [id number] DOB: 1955/07/28 DOA: 08/05/2019  PCP: [name], MD  Patient coming from: Home  I have personally briefly reviewed patient's old medical records in [place]  Chief Complaint: SOB  HPI: Edward Lee is a 64 y.o. male with medical history significant of HLD, prostate CA.  Patient has ongoing SOB for past 2 weeks, got sent in by PCP for CTA as his D.Dimer was elevated.  Patient also has cough, and was sent in as a R/O COVID.  No known COVID contacts, no loss of taste or smell, no diarrhea, no dysuria.  Patient also with reported scrotal swelling.  Symptoms constant, gradually onset.   ED Course: Scrotal swelling has actually improved per patient.  O2 sat 89% in Triage, satting low 90s at rest here in ED.  COVID POC is negative, PCR pending.  CTA chest is neg for PE but does show multifocal PNA.  WBC 20k.   Review of Systems: As per HPI, otherwise all review of systems negative.  Past Medical History:  Diagnosis Date  . Allergy   . Chronic lower back pain   . Herniated disc    L1  . Hypercholesteremia   . IBS (irritable bowel syndrome)    ? possible  . Prostate cancer [facility])     Past Surgical History:  Procedure Laterality Date  . CATARACT EXTRACTION    . COLONOSCOPY    . PILONIDAL CYST EXCISION  64 yrs old  . PROSTATE BIOPSY  02/11/15     reports that he has never smoked. He has never used smokeless tobacco. He reports current alcohol use of about 14.0 - 15.0 standard drinks of alcohol per week. He reports that he does not use drugs.  Allergies  Allergen Reactions  . Codeine     REACTION: nausea vomiting    Family History  Problem Relation Age of Onset  . Breast cancer Mother        survived without recurrence  . Prostate cancer Father        around age 84 -surgeyr  . Dementia Father        died from this at age 18  . Healthy Sister   . Healthy Brother   . Hemochromatosis Son         none in patient  . Down syndrome Son   . Colon cancer Neg Hx   . Esophageal cancer Neg Hx   . Rectal cancer Neg Hx   . Stomach cancer Neg Hx   . Pancreatic cancer Neg Hx      Prior to Admission medications   Medication Sig Start Date End Date Taking? Authorizing Provider  albuterol (VENTOLIN HFA) 108 (90 Base) MCG/ACT inhaler Inhale 2 puffs into the lungs every 6 (six) hours as needed for wheezing or shortness of breath. 08/04/19  Yes [name], MD  Ascorbic Acid (VITAMIN C) 1000 MG tablet Take 1,000 mg by mouth daily.   Yes [provider]  citalopram (CELEXA) 40 MG tablet TAKE 1 TABLET DAILY Patient taking differently: Take 40 mg by mouth daily.  05/18/19  Yes [name], MD  ELDERBERRY PO Take 1 each by mouth daily.   Yes [provider]  fluticasone furoate-vilanterol (BREO ELLIPTA) 200-25 MCG/INH AEPB Inhale 1 puff into the lungs daily. 09/03/18  Yes Mannam, Praveen, MD  Multiple Vitamin (MULTIVITAMIN WITH MINERALS) TABS tablet Take 1 tablet by mouth daily.   Yes [provider]  simvastatin (ZOCOR) 20 MG tablet TAKE 1 TABLET AT BEDTIME Patient taking differently: Take 20 mg by mouth daily.  03/18/19  Yes [name], MD  Tiotropium Bromide Monohydrate (SPIRIVA RESPIMAT) 2.5 MCG/ACT AERS Inhale 2 puffs into the lungs daily. 09/03/18  Yes Mannam, Hart Robinsons, MD  azithromycin (ZITHROMAX) 250 MG tablet Take 2 tabs daily for 3 days Patient not taking: Reported on 08/05/2019 08/04/19   [name], MD  benzonatate (TESSALON) 100 MG capsule Take 1 capsule (100 mg total) by mouth 2 (two) times daily as needed for cough. Patient not taking: Reported on 08/05/2019 06/10/19   [name], MD  predniSONE (DELTASONE) 20 MG tablet Take 2 pills for 3 days, 1 pill for 4 days Patient not taking: Reported on 08/05/2019 08/04/19   [name], MD  Truett Perna 93 MCG/ACT EXHU BLOW 2 DOSES IN EACH NOSTRIL TWICE DAILY Patient taking differently:  Place 2 Doses into both nostrils 2 (two) times daily as needed (nasal).  02/06/19   Garnet Sierras, DO    Physical Exam: Vitals:   08/05/19 0130 08/05/19 0200 08/05/19 0230 08/05/19 0330  BP: (!) 142/88 139/78 134/79 126/83  Pulse: 81 80 78 94  Resp: (!) 22 (!) 26 (!) 27 (!) 22  Temp:      TempSrc:      SpO2: 94% 92% (!) 89% 90%  Weight:      Height:        Constitutional: NAD, calm, comfortable Eyes: PERRL, lids and conjunctivae normal ENMT: Mucous membranes are moist. Posterior pharynx clear of any exudate or lesions.Normal dentition.  Neck: normal, supple, no masses, no thyromegaly Respiratory: clear to auscultation bilaterally, no wheezing, no crackles. Normal respiratory effort. No accessory muscle use.  Cardiovascular: Regular rate and rhythm, no murmurs / rubs / gallops. No extremity edema. 2+ pedal pulses. No carotid bruits.  Abdomen: no tenderness, no masses palpated. No hepatosplenomegaly. Bowel sounds positive.  Musculoskeletal: no clubbing / cyanosis. No joint deformity upper and lower extremities. Good ROM, no contractures. Normal muscle tone.  Skin: Mild erythema and edema to scrotum.  Mild TTP. Neurologic: CN 2-12 grossly intact. Sensation intact, DTR normal. Strength 5/5 in all 4.  Psychiatric: Normal judgment and insight. Alert and oriented x 3. Normal mood.    Labs on Admission: I have personally reviewed following labs and imaging studies  CBC: Recent Labs  Lab 08/04/19 2307 08/05/19 0123  WBC 20.2*  --   HGB 12.5* 12.6*  HCT 37.9* 37.0*  MCV 88.8  --   PLT 424*  --    Basic Metabolic Panel: Recent Labs  Lab 08/04/19 2307 08/05/19 0123  NA 132* 132*  K 4.1 4.1  CL 96* 96*  CO2 26  --   GLUCOSE 124* 103*  BUN 19 18  CREATININE 1.08 1.00  CALCIUM 9.6  --    GFR: Estimated Creatinine Clearance: 85.6 mL/min (by C-G formula based on SCr of 1 mg/dL). Liver Function Tests: No results for input(s): AST, ALT, ALKPHOS, BILITOT, PROT, ALBUMIN in the last  168 hours. No results for input(s): LIPASE, AMYLASE in the last 168 hours. No results for input(s): AMMONIA in the last 168 hours. Coagulation Profile: No results for input(s): INR, PROTIME in the last 168 hours. Cardiac Enzymes: No results for input(s): CKTOTAL, CKMB, CKMBINDEX, TROPONINI in the last 168 hours. BNP (last 3 results) No results for input(s): PROBNP in the last 8760 hours. HbA1C: No results for input(s): HGBA1C in the  last 72 hours. CBG: No results for input(s): GLUCAP in the last 168 hours. Lipid Profile: Recent Labs    08/05/19 0100  TRIG 76   Thyroid Function Tests: No results for input(s): TSH, T4TOTAL, FREET4, T3FREE, THYROIDAB in the last 72 hours. Anemia Panel: Recent Labs    08/05/19 0100  FERRITIN 265   Urine analysis:    Component Value Date/Time   COLORURINE YELLOW 08/04/2019 2257   APPEARANCEUR CLEAR 08/04/2019 2257   LABSPEC 1.010 08/04/2019 2257   PHURINE 6.0 08/04/2019 2257   GLUCOSEU NEGATIVE 08/04/2019 2257   HGBUR NEGATIVE 08/04/2019 2257   HGBUR negative 03/02/2010 0848   BILIRUBINUR NEGATIVE 08/04/2019 2257   BILIRUBINUR Negative 12/14/2014 1054   KETONESUR NEGATIVE 08/04/2019 2257   PROTEINUR NEGATIVE 08/04/2019 2257   UROBILINOGEN 0.2 12/14/2014 1054   UROBILINOGEN 0.2 03/02/2010 0848   NITRITE NEGATIVE 08/04/2019 2257   LEUKOCYTESUR NEGATIVE 08/04/2019 2257    Radiological Exams on Admission: DG Chest 2 View  Result Date: 08/04/2019 CLINICAL DATA:  Shortness of breath EXAM: CHEST - 2 VIEW COMPARISON:  09/22/2018 FINDINGS: Bilateral ground-glass opacities and consolidations, most evident in the upper lobes. Normal heart size. No pleural effusion or pneumothorax. IMPRESSION: Ground-glass opacity and consolidations in the bilateral lungs, most evident in the upper lobes, concerning for multifocal pneumonia. Electronically Signed   By: Donavan Foil M.D.   On: 08/04/2019 23:09   CT Angio Chest PE W and/or Wo Contrast  Result  Date: 08/05/2019 CLINICAL DATA:  Shortness of breath. Elevated D-dimer. Elevated white blood cell count. EXAM: CT ANGIOGRAPHY CHEST WITH CONTRAST TECHNIQUE: Multidetector CT imaging of the chest was performed using the standard protocol during bolus administration of intravenous contrast. Multiplanar CT image reconstructions and MIPs were obtained to evaluate the vascular anatomy. CONTRAST:  144mL OMNIPAQUE IOHEXOL 350 MG/ML SOLN COMPARISON:  Radiograph yesterday. FINDINGS: Cardiovascular: There are no filling defects within the pulmonary arteries to suggest pulmonary embolus. Minimal aortic atherosclerosis. No evidence of aortic dissection. Heart is normal in size. Heart is normal in size. There are coronary artery calcifications. No significant pericardial effusion. Mediastinum/Nodes: Multifocal mediastinal and bilateral hilar adenopathy. Largest right hilar node measures 16 mm short axis. No thyroid nodule. Decompressed esophagus Lungs/Pleura: Extensive bilateral lung consolidations. Upper lobe predominant with additional involvement of the superior segment of the lower lobes, minimally the right middle lobe and lingula. Opacities are ground-glass and consolidative with central bronchograms. No definite cavitary component. No pleural effusion. Minimal retained mucus in the trachea. Mainstem bronchi are patent. Upper Abdomen: No acute findings. Musculoskeletal: There are no acute or suspicious osseous abnormalities. Review of the MIP images confirms the above findings. IMPRESSION: 1. No pulmonary embolus. 2. Extensive upper lobe predominant bilateral lung consolidations, ground-glass and consolidative with central bronchograms. Findings are most consistent with multifocal pneumonia. Recommend close clinical and radiographic to ensure resolution. Atypical organisms and inflammatory pneumonia is are considered. 3. Multifocal mediastinal and hilar adenopathy, likely reactive. 4. Coronary artery calcifications.  Electronically Signed   By: Keith Rake M.D.   On: 08/05/2019 03:22    EKG: Independently reviewed.  Assessment/Plan Principal Problem:   Multifocal pneumonia Active Problems:   Tinea cruris    1. Multifocal PNA - 1. CAP vs COVID vs COVID with superimposed CAP. 2. PNA pathway 3. Rocephin / azithro 4. COVID PCR pending 5. Due to low O2 sats in triage will start decadron (either for severe CAP or for COVID). 6. Cont pulse ox 7. Add remdesivir if COVID PCR positive 8.  RVP pending 9. BCx and sputum Cx pending 2. Tinea Cruris vs mild scrotal cellulitis - 1. Nystatin cream 2. On ABx as above 3. I dont think this represents Fournier's at this time 1. Swelling symptoms are improving per patient after Tinactin treatment yesterday (see also PCP note) 2. No pain out of proportion to exam findings 3. Only mild erythema and edema 4. No crepitus 4. EDP is of the firm opinion that this represents Tinea Cruris 5. EDP doesn't feel that this needs vascular imaging. 1. I dont see anything that makes me disagree with EDP assessment in this regard  DVT prophylaxis: Lovenox Code Status: Full Family Communication: No family in room Disposition Plan: Home after admit Consults called: None Admission status: Admit to inpatient  Severity of Illness: The appropriate patient status for this patient is INPATIENT. Inpatient status is judged to be reasonable and necessary in order to provide the required intensity of service to ensure the patient's safety. The patient's presenting symptoms, physical exam findings, and initial radiographic and laboratory data in the context of their chronic comorbidities is felt to place them at high risk for further clinical deterioration. Furthermore, it is not anticipated that the patient will be medically stable for discharge from the hospital within 2 midnights of admission. The following factors support the patient status of inpatient.   IP status due to  bilateral multifocal PNA with initial hypoxia in triage, low O2 sats with activity.   * I certify that at the point of admission it is my clinical judgment that the patient will require inpatient hospital care spanning beyond 2 midnights from the point of admission due to high intensity of service, high risk for further deterioration and high frequency of surveillance required.*    Shamone Winzer M. DO [facility]  How to contact the [facility] Attending or Consulting provider [place] or covering provider during after hours [place], for this patient?  1. Check the care team in [facility] and look for a) attending/consulting [facility] provider listed and b) the [facility] team listed 2. Log into [email or web address]  Amion Physician Scheduling and messaging for groups and whole hospitals  On call and physician scheduling software for group practices, residents, hospitalists and other medical providers for call, clinic, rotation and shift schedules. OnCall [employer] is a hospital-wide system for scheduling doctors and paging doctors on call. EasyPlot is for scientific plotting and data analysis.  [email or web address]  and use [place]'s universal password to access. If you do not have the password, please contact the hospital operator.  3. Locate the [facility] provider you are looking for under [facility] and page to a number that you can be directly reached. 4. If you still have difficulty reaching the provider, please page the [facility] (Director on Call) for the Hospitalists listed on amion for assistance.  08/05/2019, 4:07 AM

## 2019-08-05 NOTE — ED Provider Notes (Addendum)
[facility] DEPT Provider Note   CSN: [id number] Arrival date & time: 08/04/19  2236     History Chief Complaint  Patient presents with  . Elevated D-dimer  . Shortness of Breath    Edward Lee is a 64 y.o. male.  The history is provided by the patient.  Shortness of Breath Severity:  Moderate Onset quality:  Gradual Timing:  Constant Progression:  Unchanged Chronicity:  New Context: not smoke exposure   Relieved by:  Nothing Worsened by:  Nothing Ineffective treatments:  None tried Associated symptoms: cough   Associated symptoms: no abdominal pain, no chest pain and no fever   Risk factors: no hx of PE/DVT   Patient with ongoing SOB for 2 weeks sent in for CTA as PMD got ddimer that was elevated.  Patient has cough and was sent in for rule out covid.  Reportedly also swollen scrotum.  No known covid contacts.  No loss of taste or smell.  No diarrhea.  No recent surgery.  No dysuria.  No penile pain or sores.      Past Medical History:  Diagnosis Date  . Allergy   . Chronic lower back pain   . Herniated disc    L1  . Hypercholesteremia   . IBS (irritable bowel syndrome)    ? possible  . Prostate cancer [facility])     Patient Active Problem List   Diagnosis Date Noted  . Recurrent maxillary sinusitis 11/17/2018  . Other allergic rhinitis 11/17/2018  . Severe persistent asthma without complication 62/69/4854  . Mild persistent asthma without complication 62/70/3500  . Chronic low back pain 01/10/2017  . Malignant neoplasm of prostate ([place]) 04/26/2015  . IBS (irritable bowel syndrome)   . Urinary frequency 11/28/2011  . Hyperlipidemia 03/21/2011  . PTSD (post-traumatic stress disorder) 09/06/2010    Past Surgical History:  Procedure Laterality Date  . CATARACT EXTRACTION    . COLONOSCOPY    . PILONIDAL CYST EXCISION  64 yrs old  . PROSTATE BIOPSY  02/11/15       Family History  Problem Relation Age of Onset  . Breast  cancer Mother        survived without recurrence  . Prostate cancer Father        around age 19 -surgeyr  . Dementia Father        died from this at age 64  . Healthy Sister   . Healthy Brother   . Hemochromatosis Son        none in patient  . Down syndrome Son   . Colon cancer Neg Hx   . Esophageal cancer Neg Hx   . Rectal cancer Neg Hx   . Stomach cancer Neg Hx   . Pancreatic cancer Neg Hx     Social History   Tobacco Use  . Smoking status: Never Smoker  . Smokeless tobacco: Never Used  Substance Use Topics  . Alcohol use: Yes    Alcohol/week: 14.0 - 15.0 standard drinks    Types: 7 - 8 Glasses of wine, 7 Standard drinks or equivalent per week    Comment: occ  . Drug use: No    Home Medications Prior to Admission medications   Medication Sig Start Date End Date Taking? Authorizing Provider  albuterol (VENTOLIN HFA) 108 (90 Base) MCG/ACT inhaler Inhale 2 puffs into the lungs every 6 (six) hours as needed for wheezing or shortness of breath. 08/04/19  Yes [name], MD  Ascorbic  Acid (VITAMIN C) 1000 MG tablet Take 1,000 mg by mouth daily.   Yes [provider]  citalopram (CELEXA) 40 MG tablet TAKE 1 TABLET DAILY Patient taking differently: Take 40 mg by mouth daily.  05/18/19  Yes [name], MD  ELDERBERRY PO Take 1 each by mouth daily.   Yes [provider]  fluticasone furoate-vilanterol (BREO ELLIPTA) 200-25 MCG/INH AEPB Inhale 1 puff into the lungs daily. 09/03/18  Yes Mannam, Praveen, MD  Multiple Vitamin (MULTIVITAMIN WITH MINERALS) TABS tablet Take 1 tablet by mouth daily.   Yes [provider]  simvastatin (ZOCOR) 20 MG tablet TAKE 1 TABLET AT BEDTIME Patient taking differently: Take 20 mg by mouth daily.  03/18/19  Yes [name], MD  Tiotropium Bromide Monohydrate (SPIRIVA RESPIMAT) 2.5 MCG/ACT AERS Inhale 2 puffs into the lungs daily. 09/03/18  Yes Mannam, Hart Robinsons, MD  azithromycin (ZITHROMAX) 250 MG tablet Take 2  tabs daily for 3 days Patient not taking: Reported on 08/05/2019 08/04/19   [name], MD  benzonatate (TESSALON) 100 MG capsule Take 1 capsule (100 mg total) by mouth 2 (two) times daily as needed for cough. Patient not taking: Reported on 08/05/2019 06/10/19   [name], MD  predniSONE (DELTASONE) 20 MG tablet Take 2 pills for 3 days, 1 pill for 4 days Patient not taking: Reported on 08/05/2019 08/04/19   [name], MD  Truett Perna 93 MCG/ACT EXHU BLOW 2 DOSES IN EACH NOSTRIL TWICE DAILY Patient taking differently: Place 2 Doses into both nostrils 2 (two) times daily as needed (nasal).  02/06/19   Garnet Sierras, DO    Allergies    Codeine  Review of Systems   Review of Systems  Constitutional: Negative for fever.  HENT: Negative for congestion.   Eyes: Negative for visual disturbance.  Respiratory: Positive for cough and shortness of breath.   Cardiovascular: Negative for chest pain, palpitations and leg swelling.  Gastrointestinal: Negative for abdominal pain.  Genitourinary: Positive for scrotal swelling. Negative for difficulty urinating, discharge, dysuria, genital sores and penile pain.  Neurological: Negative for dizziness.  Psychiatric/Behavioral: Negative for agitation.  All other systems reviewed and are negative.   Physical Exam Updated Vital Signs BP 130/80   Pulse 80   Temp 97.9 F (36.6 C) (Oral)   Resp 19   Ht '6\' 2"'$  (1.88 m)   Wt 84.4 kg   SpO2 96%   BMI 23.88 kg/m   Physical Exam Vitals and nursing note reviewed.  Constitutional:      General: He is not in acute distress.    Appearance: Normal appearance.  HENT:     Head: Normocephalic and atraumatic.     Nose: Nose normal.  Eyes:     Conjunctiva/sclera: Conjunctivae normal.     Pupils: Pupils are equal, round, and reactive to light.  Cardiovascular:     Rate and Rhythm: Normal rate and regular rhythm.     Pulses: Normal pulses.     Heart sounds: Normal heart sounds.  Pulmonary:      Effort: Pulmonary effort is normal.     Breath sounds: Normal breath sounds.  Abdominal:     General: Abdomen is flat. Bowel sounds are normal.     Tenderness: There is no abdominal tenderness. There is no guarding or rebound.  Genitourinary:    Penis: Normal.      Testes: Cremasteric reflex is present.        Right: Tenderness, swelling, testicular hydrocele or  varicocele not present.        Left: Tenderness, swelling, testicular hydrocele or varicocele not present.       Comments: Penis and scrotum are normal in size. There is no warmth nor erythema. No crepitus.  There is tinea cruris on the ventral penis and scrotum. There is no dilation of the penile veins to suggest vascular occlusion.   Musculoskeletal:        General: Normal range of motion.     Cervical back: Normal range of motion and neck supple.     Right lower leg: No edema.     Left lower leg: No edema.  Skin:    General: Skin is warm and dry.     Capillary Refill: Capillary refill takes less than 2 seconds.  Neurological:     General: No focal deficit present.     Mental Status: He is alert and oriented to person, place, and time.     Deep Tendon Reflexes: Reflexes normal.  Psychiatric:        Mood and Affect: Mood normal.        Behavior: Behavior normal.     ED Results / Procedures / Treatments   Labs (all labs ordered are listed, but only abnormal results are displayed) Results for orders placed or performed during the hospital encounter of 08/05/19  Urinalysis, Routine w reflex microscopic  Result Value Ref Range   Color, Urine YELLOW YELLOW   APPearance CLEAR CLEAR   Specific Gravity, Urine 1.010 1.005 - 1.030   pH 6.0 5.0 - 8.0   Glucose, UA NEGATIVE NEGATIVE mg/dL   Hgb urine dipstick NEGATIVE NEGATIVE   Bilirubin Urine NEGATIVE NEGATIVE   Ketones, ur NEGATIVE NEGATIVE mg/dL   Protein, ur NEGATIVE NEGATIVE mg/dL   Nitrite NEGATIVE NEGATIVE   Leukocytes,Ua NEGATIVE NEGATIVE  CBC  Result Value Ref  Range   WBC 20.2 (H) 4.0 - 10.5 K/uL   RBC 4.27 4.22 - 5.81 MIL/uL   Hemoglobin 12.5 (L) 13.0 - 17.0 g/dL   HCT 37.9 (L) 39.0 - 52.0 %   MCV 88.8 80.0 - 100.0 fL   MCH 29.3 26.0 - 34.0 pg   MCHC 33.0 30.0 - 36.0 g/dL   RDW 11.9 11.5 - 15.5 %   Platelets 424 (H) 150 - 400 K/uL   nRBC 0.0 0.0 - 0.2 %  Basic metabolic panel  Result Value Ref Range   Sodium 132 (L) 135 - 145 mmol/L   Potassium 4.1 3.5 - 5.1 mmol/L   Chloride 96 (L) 98 - 111 mmol/L   CO2 26 22 - 32 mmol/L   Glucose, Bld 124 (H) 70 - 99 mg/dL   BUN 19 8 - 23 mg/dL   Creatinine, Ser 1.08 0.61 - 1.24 mg/dL   Calcium 9.6 8.9 - 10.3 mg/dL   GFR calc non Af Amer >60 >60 mL/min   GFR calc Af Amer >60 >60 mL/min   Anion gap 10 5 - 15  Lactic acid, plasma  Result Value Ref Range   Lactic Acid, Venous 1.0 0.5 - 1.9 mmol/L  Lactate dehydrogenase  Result Value Ref Range   LDH 184 98 - 192 U/L  Triglycerides  Result Value Ref Range   Triglycerides 76 <150 mg/dL  Fibrinogen  Result Value Ref Range   Fibrinogen 732 (H) 210 - 475 mg/dL  POC SARS Coronavirus 2 Ag-ED - Nasal Swab (BD Veritor Kit)  Result Value Ref Range   SARS Coronavirus 2 Ag NEGATIVE NEGATIVE  I-stat  chem 8, ED (not at [facility] or [facility])  Result Value Ref Range   Sodium 132 (L) 135 - 145 mmol/L   Potassium 4.1 3.5 - 5.1 mmol/L   Chloride 96 (L) 98 - 111 mmol/L   BUN 18 8 - 23 mg/dL   Creatinine, Ser 1.00 0.61 - 1.24 mg/dL   Glucose, Bld 103 (H) 70 - 99 mg/dL   Calcium, Ion 1.18 1.15 - 1.40 mmol/L   TCO2 27 22 - 32 mmol/L   Hemoglobin 12.6 (L) 13.0 - 17.0 g/dL   HCT 37.0 (L) 39.0 - 52.0 %   DG Chest 2 View  Result Date: 08/04/2019 CLINICAL DATA:  Shortness of breath EXAM: CHEST - 2 VIEW COMPARISON:  09/22/2018 FINDINGS: Bilateral ground-glass opacities and consolidations, most evident in the upper lobes. Normal heart size. No pleural effusion or pneumothorax. IMPRESSION: Ground-glass opacity and consolidations in the bilateral lungs, most evident in the  upper lobes, concerning for multifocal pneumonia. Electronically Signed   By: Donavan Foil M.D.   On: 08/04/2019 23:09    EKG  EKG Interpretation  Date/Time:  Wednesday August 05 2019 02:12:57 EST Ventricular Rate:  84 PR Interval:    QRS Duration: 88 QT Interval:  360 QTC Calculation: 426 R Axis:   65 Text Interpretation: Sinus rhythm Confirmed by Dory Horn) on 08/05/2019 3:32:36 AM       Radiology DG Chest 2 View  Result Date: 08/04/2019 CLINICAL DATA:  Shortness of breath EXAM: CHEST - 2 VIEW COMPARISON:  09/22/2018 FINDINGS: Bilateral ground-glass opacities and consolidations, most evident in the upper lobes. Normal heart size. No pleural effusion or pneumothorax. IMPRESSION: Ground-glass opacity and consolidations in the bilateral lungs, most evident in the upper lobes, concerning for multifocal pneumonia. Electronically Signed   By: Donavan Foil M.D.   On: 08/04/2019 23:09    Procedures Procedures (including critical care time)  Medications Ordered in ED Medications  sodium chloride (PF) 0.9 % injection (has no administration in time range)  cefTRIAXone (ROCEPHIN) 1 g in sodium chloride 0.9 % 100 mL IVPB (has no administration in time range)  azithromycin (ZITHROMAX) 500 mg in sodium chloride 0.9 % 250 mL IVPB (has no administration in time range)  dexamethasone (DECADRON) injection 6 mg (has no administration in time range)  iohexol (OMNIPAQUE) 350 MG/ML injection 100 mL (100 mLs Intravenous Contrast Given 08/05/19 0257)    ED Course  I have reviewed the triage vital signs and the nursing notes.  Pertinent labs & imaging results that were available during my care of the patient were reviewed by me and considered in my medical decision making (see chart for details).     The patient does not need a vascular study of the abdomen or the genitals. The abdominal exam is benign and reassuring and he has no clots in the chest on CT scan and I genitals are not  white or blue nor are the edematous. There is nothing to suggest or indicate a vascular work up. He does however have tinea cruris and can antifungal cream vs powder will resolve this.  He has been treated for CAP given the white count. Following discussion with Dr. Alcario Drought we will start dexamethasone.   Final Clinical Impression(s) / ED Diagnoses Patient is hypoxic to 89% on Room air.  Will admit to medicine.  Given covid  labs and CXR I still believe this may be covid 19, the white count argues for bacterial source.     Jolene Guyett, MD 08/05/19  [place], Takeru Bose, MD 08/05/19 0345    Aubra Pappalardo, MD 08/05/19 4332

## 2019-08-05 NOTE — Progress Notes (Signed)
PROGRESS NOTE  [name] [id number] DOB: 06-22-1956 DOA: 08/05/2019 PCP: [name], MD  Brief History   Edward Lee is a 64 y.o. male with medical history significant of HLD, prostate CA. Patient has ongoing SOB for past 2 weeks, got sent in by PCP for CTA as his D.Dimer was elevated. Patient also has cough, and was sent in as a R/O COVID.  No known COVID contacts, no loss of taste or smell, no diarrhea, no dysuria. Patient also with reported scrotal swelling. Symptoms constant, gradually onset. Scrotal swelling has actually improved per patient. O2 sat 89% in Triage, satting low 90s at rest here in ED. COVID POC is negative, PCR pending. CTA chest is neg for PE but does show multifocal PNA.  Consultants  . None  Procedures  . None  Antibiotics   Anti-infectives (From admission, onward)   Start     Dose/Rate Route Frequency Ordered Stop   08/06/19 1600  cefTRIAXone (ROCEPHIN) 2 g in sodium chloride 0.9 % 100 mL IVPB  Status:  Discontinued     2 g 200 mL/hr over 30 Minutes Intravenous Daily 08/05/19 0350 08/05/19 0406   08/06/19 0400  azithromycin (ZITHROMAX) 500 mg in sodium chloride 0.9 % 250 mL IVPB     500 mg 250 mL/hr over 60 Minutes Intravenous Every 24 hours 08/05/19 0350 08/11/19 0359   08/05/19 1600  cefTRIAXone (ROCEPHIN) 2 g in sodium chloride 0.9 % 100 mL IVPB     2 g 200 mL/hr over 30 Minutes Intravenous Daily 08/05/19 0406 08/10/19 0959   08/05/19 0345  cefTRIAXone (ROCEPHIN) 1 g in sodium chloride 0.9 % 100 mL IVPB     1 g 200 mL/hr over 30 Minutes Intravenous  Once 08/05/19 0334 08/05/19 0451   08/05/19 0345  azithromycin (ZITHROMAX) 500 mg in sodium chloride 0.9 % 250 mL IVPB     500 mg 250 mL/hr over 60 Minutes Intravenous  Once 08/05/19 0334 08/05/19 0457    .   Subjective  The patient is resting comfortable. No new complaints.  Objective   Vitals:  Vitals:   08/05/19 1400 08/05/19 1540  BP: 123/80 104/84  Pulse: 74 81  Resp: (!) 24  (!) 24  Temp:  97.9 F (36.6 C)  SpO2: 99% 97%  Exam:  Constitutional:  . The patient is awake, alert, and oriented x 3. No acute distress. Respiratory:  . No increased work of breathing. . No wheezes, rales, or rhonchi . No tactile fremitus Cardiovascular:  . Regular rate and rhythm . No murmurs, ectopy, or gallups. . No lateral PMI. No thrills. Abdomen:  . Abdomen is soft, non-tender, non-distended . No hernias, masses, or organomegaly . Normoactive bowel sounds.  Musculoskeletal:  . No cyanosis, clubbing, or edema Skin:  . No rashes, lesions, ulcers . palpation of skin: no induration or nodules Neurologic:  . CN 2-12 intact . Sensation all 4 extremities intact Psychiatric:  . Mental status o Mood, affect appropriate o Orientation to person, place, time  . judgment and insight appear intact  I have personally reviewed the following:   Today's Data  . Vitals, BMP, CBC  Micro Data  . Blood culture x 2 have had no growth.  Imaging  . CTA chest  Scheduled Meds: . citalopram  40 mg Oral Daily  . dexamethasone  6 mg Oral Q0600  . enoxaparin (LOVENOX) injection  40 mg Subcutaneous Daily  . fluticasone furoate-vilanterol  1 puff Inhalation Daily  . multivitamin  with minerals  1 tablet Oral Daily  . nystatin cream   Topical TID  . simvastatin  20 mg Oral q1800  . umeclidinium bromide  1 puff Inhalation Daily   Continuous Infusions: . [START ON 08/06/2019] azithromycin    . cefTRIAXone (ROCEPHIN)  IV      Principal Problem:   Multifocal pneumonia Active Problems:   Tinea cruris   Pneumonia   LOS: 0 days   A & P  Multifocal PNA: Community acquired pneumonia. The patient is receiving rocephin and azithromycin. He has received decadron for severe hypoxia as well as supplementary o2. This has now been stopped. RVP and COVID-19 were negative. Blood cultures have had no growth. He is on the pneumonia pathway.  Acute respiratory failure: The patient is currently  saturating 97% on 2 liters. Wean as possible.   Tinea Cruris vs mild scrotal cellulitis: The patient is receiving Nystatin cream as well as antibiotics which are also prescribed for pneumonia. No signs of fourniere's. There is no pain out of proportion to exam findings. Per admitting physician there is only mild erythema and edema, and no crepitus.   DVT prophylaxis: Lovenox Code Status: Full Family Communication: No family in room Disposition Plan: Home after admit  Remonia Otte, DO [facility] Direct contact: see [email or web address]  7PM-7AM contact night coverage as above 08/05/2019, 5:02 PM  LOS: 0 days

## 2019-08-05 NOTE — Telephone Encounter (Signed)
Patient had White count 18.2.

## 2019-08-05 NOTE — Telephone Encounter (Signed)
Patient in hospital.

## 2019-08-05 NOTE — Telephone Encounter (Signed)
Patient stated he was in the hospital and that the hospital has been doing many test on him. Patient wanted to know if Dr. Yong Channel has seen any of the results. Patient would like call back to discuss. Please advise patient is still in the hospital.

## 2019-08-06 ENCOUNTER — Telehealth: Payer: Self-pay

## 2019-08-06 MED ORDER — AZITHROMYCIN 500 MG PO TABS: ORAL_TABLET | ORAL | 0 refills | Status: AC

## 2019-08-06 MED ORDER — AZITHROMYCIN 250 MG PO TABS: 500.0000 mg | ORAL_TABLET | Freq: Every day | ORAL | Status: DC

## 2019-08-06 MED ORDER — AMOXICILLIN-POT CLAVULANATE 875-125 MG PO TABS: 1.0000 | ORAL_TABLET | Freq: Two times a day (BID) | ORAL | 0 refills | Status: AC

## 2019-08-06 MED ORDER — NYSTATIN 100000 UNIT/GM EX CREA: TOPICAL_CREAM | Freq: Three times a day (TID) | CUTANEOUS | 0 refills | Status: DC

## 2019-08-06 NOTE — Telephone Encounter (Signed)
I was going to call patient tonight but noted that he was discharged from hospital.  Lets make sure to set him up with transitional care visit for hospital follow-up-thanks Courtney for helping out

## 2019-08-06 NOTE — Telephone Encounter (Signed)
FYI

## 2019-08-06 NOTE — Progress Notes (Signed)
Patient ambulated in the hallway. His O2 started at 93 and dropped to 91. Patient had no complaints of shortness of breath.

## 2019-08-06 NOTE — Discharge Summary (Signed)
Physician Discharge Summary  Edward Lee [id number] DOB: 11/07/1955 DOA: 08/05/2019  PCP: [name], MD  Admit date: 08/05/2019 Discharge date: 08/06/2019  Recommendations for Outpatient Follow-up:  1. Follow up with PCP in 7-10 days.  Discharge Diagnoses: Principal diagnosis is #1 1. Multifocal pneumonia, community acquired 2. Respiratory failure 3. Tinea cruris  Discharge Condition: Fair  Disposition: Home  Diet recommendation: Regular  Filed Weights   08/04/19 2246  Weight: 84.4 kg    History of present illness:  Edward Lee is a 64 y.o. male with medical history significant of HLD, prostate CA. Patient has ongoing SOB for past 2 weeks, got sent in by PCP for CTA as his D.Dimer was elevated. Patient also has cough, and was sent in as a R/O COVID.  No known COVID contacts, no loss of taste or smell, no diarrhea, no dysuria. Patient also with reported scrotal swelling. Symptoms constant, gradually onset.  Hospital Course:  Edward Lee a 64 y.o.malewith medical history significant ofHLD, prostate CA. Patient has ongoing SOB for past 2 weeks, got sent in by PCP for CTA as his D.Dimer was elevated. Patient also has cough, and was sent in as a R/O COVID. No known COVID contacts, no loss of taste or smell, no diarrhea, no dysuria. Patient also with reported scrotal swelling. Symptoms constant, gradually onset. Scrotal swelling has actually improved per patient. O2 sat 89% in Triage, satting low 90s at rest here in ED. COVID POC is negative, PCR pending. CTA chest is neg for PE but does show multifocal PNA.  Today's assessment: S: The patient is resting comfortably. No new complaints. O: Vitals:  Vitals:   08/06/19 1030 08/06/19 1340  BP:  132/84  Pulse:  62  Resp:  16  Temp:  98.6 F (37 C)  SpO2: 94% 94%    Constitutional:   The patient is awake, alert, and oriented x 3. No acute distress. Respiratory:   No increased work of breathing.  No  wheezes, rales, or rhonchi  No tactile fremitus Cardiovascular:   Regular rate and rhythm  No murmurs, ectopy, or gallups.  No lateral PMI. No thrills. Abdomen:   Abdomen is soft, non-tender, non-distended  No hernias, masses, or organomegaly  Normoactive bowel sounds.  Musculoskeletal:   No cyanosis, clubbing, or edema Skin:   No rashes, lesions, ulcers  palpation of skin: no induration or nodules Neurologic:   CN 2-12 intact  Sensation all 4 extremities intact Psychiatric:   Mental status ? Mood, affect appropriate ? Orientation to person, place, time   judgment and insight appear intact   Discharge Instructions  Discharge Instructions    Activity as tolerated - No restrictions   Complete by: As directed    Call MD for:  difficulty breathing, headache or visual disturbances   Complete by: As directed    Call MD for:  temperature >100.4   Complete by: As directed    Diet - low sodium heart healthy   Complete by: As directed    Discharge instructions   Complete by: As directed    Follow up with PCP in 7-10 days.   Increase activity slowly   Complete by: As directed      Allergies as of 08/06/2019      Reactions   Codeine    REACTION: nausea vomiting      Medication List    STOP taking these medications   ELDERBERRY PO     TAKE these medications  albuterol 108 (90 Base) MCG/ACT inhaler Commonly known as: VENTOLIN HFA Inhale 2 puffs into the lungs every 6 (six) hours as needed for wheezing or shortness of breath.   amoxicillin-clavulanate 875-125 MG tablet Commonly known as: Augmentin Take 1 tablet by mouth 2 (two) times daily for 8 days.   azithromycin 500 MG tablet Commonly known as: ZITHROMAX Take one by mouth daily Start taking on: August 07, 2019   citalopram 40 MG tablet Commonly known as: CELEXA TAKE 1 TABLET DAILY   fluticasone furoate-vilanterol 200-25 MCG/INH Aepb Commonly known as: Breo Ellipta Inhale 1 puff into the  lungs daily.   multivitamin with minerals Tabs tablet Take 1 tablet by mouth daily.   nystatin cream Commonly known as: MYCOSTATIN Apply topically 3 (three) times daily.   simvastatin 20 MG tablet Commonly known as: ZOCOR TAKE 1 TABLET AT BEDTIME What changed: when to take this   Tiotropium Bromide Monohydrate 2.5 MCG/ACT Aers Commonly known as: Spiriva Respimat Inhale 2 puffs into the lungs daily.   vitamin C 1000 MG tablet Take 1,000 mg by mouth daily.   Xhance 93 MCG/ACT Exhu Generic drug: Fluticasone Propionate BLOW 2 DOSES IN EACH NOSTRIL TWICE DAILY What changed: See the new instructions.      Allergies  Allergen Reactions  . Codeine     REACTION: nausea vomiting    The results of significant diagnostics from this hospitalization (including imaging, microbiology, ancillary and laboratory) are listed below for reference.    Significant Diagnostic Studies: DG Chest 2 View  Result Date: 08/04/2019 CLINICAL DATA:  Shortness of breath EXAM: CHEST - 2 VIEW COMPARISON:  09/22/2018 FINDINGS: Bilateral ground-glass opacities and consolidations, most evident in the upper lobes. Normal heart size. No pleural effusion or pneumothorax. IMPRESSION: Ground-glass opacity and consolidations in the bilateral lungs, most evident in the upper lobes, concerning for multifocal pneumonia. Electronically Signed   By: Donavan Foil M.D.   On: 08/04/2019 23:09   CT Angio Chest PE W and/or Wo Contrast  Result Date: 08/05/2019 CLINICAL DATA:  Shortness of breath. Elevated D-dimer. Elevated white blood cell count. EXAM: CT ANGIOGRAPHY CHEST WITH CONTRAST TECHNIQUE: Multidetector CT imaging of the chest was performed using the standard protocol during bolus administration of intravenous contrast. Multiplanar CT image reconstructions and MIPs were obtained to evaluate the vascular anatomy. CONTRAST:  157mL OMNIPAQUE IOHEXOL 350 MG/ML SOLN COMPARISON:  Radiograph yesterday. FINDINGS:  Cardiovascular: There are no filling defects within the pulmonary arteries to suggest pulmonary embolus. Minimal aortic atherosclerosis. No evidence of aortic dissection. Heart is normal in size. Heart is normal in size. There are coronary artery calcifications. No significant pericardial effusion. Mediastinum/Nodes: Multifocal mediastinal and bilateral hilar adenopathy. Largest right hilar node measures 16 mm short axis. No thyroid nodule. Decompressed esophagus Lungs/Pleura: Extensive bilateral lung consolidations. Upper lobe predominant with additional involvement of the superior segment of the lower lobes, minimally the right middle lobe and lingula. Opacities are ground-glass and consolidative with central bronchograms. No definite cavitary component. No pleural effusion. Minimal retained mucus in the trachea. Mainstem bronchi are patent. Upper Abdomen: No acute findings. Musculoskeletal: There are no acute or suspicious osseous abnormalities. Review of the MIP images confirms the above findings. IMPRESSION: 1. No pulmonary embolus. 2. Extensive upper lobe predominant bilateral lung consolidations, ground-glass and consolidative with central bronchograms. Findings are most consistent with multifocal pneumonia. Recommend close clinical and radiographic to ensure resolution. Atypical organisms and inflammatory pneumonia is are considered. 3. Multifocal mediastinal and hilar adenopathy, likely reactive. 4. Coronary  artery calcifications. Electronically Signed   By: Keith Rake M.D.   On: 08/05/2019 03:22    Microbiology: Recent Results (from the past 240 hour(s))  Blood Culture (routine x 2)     Status: None (Preliminary result)   Collection Time: 08/05/19  1:00 AM   Specimen: BLOOD  Result Value Ref Range Status   Specimen Description   Final    BLOOD LEFT ANTECUBITAL Performed at [facility] [address], [place], [place] [address]    Special Requests   Final    BOTTLES  DRAWN AEROBIC AND ANAEROBIC Blood Culture adequate volume Performed at [facility] [address], [place], [place] [address]    Culture   Final    NO GROWTH 1 DAY Performed at [facility], [place] [address], [place], [place] [address]    Report Status PENDING  Incomplete  Blood Culture (routine x 2)     Status: None (Preliminary result)   Collection Time: 08/05/19  1:00 AM   Specimen: BLOOD  Result Value Ref Range Status   Specimen Description   Final    BLOOD RIGHT ANTECUBITAL Performed at [facility] [address] [place], [place] [address]    Special Requests   Final    BOTTLES DRAWN AEROBIC AND ANAEROBIC Blood Culture adequate volume Performed at [facility] [address] [place], [place] [address]    Culture   Final    NO GROWTH 1 DAY Performed at [facility], [place] [address] [place], [place] [address]    Report Status PENDING  Incomplete  SARS CORONAVIRUS 2 (TAT 6-24 HRS) Nasopharyngeal Nasopharyngeal Swab     Status: None   Collection Time: 08/05/19  3:49 AM   Specimen: Nasopharyngeal Swab  Result Value Ref Range Status   SARS Coronavirus 2 NEGATIVE NEGATIVE Final    Comment: (NOTE) SARS-CoV-2 target nucleic acids are NOT DETECTED. The SARS-CoV-2 RNA is generally detectable in upper and lower respiratory specimens during the acute phase of infection. Negative results do not preclude SARS-CoV-2 infection, do not rule out co-infections with other pathogens, and should not be used as the sole basis for treatment or other patient management decisions. Negative results must be combined with clinical observations, patient history, and epidemiological information. The expected result is Negative. Fact Sheet for Patients: SugarRoll.be Fact Sheet for Healthcare Providers: https://www.woods-mathews.com/ This test is not yet approved or cleared by the [place] FDA and  has been authorized for detection and/or diagnosis of SARS-CoV-2 by FDA under an Emergency Use Authorization (EUA). This EUA will remain  in effect (meaning this test can be used) for the duration of the COVID-19 declaration under Section 56 4(b)(1) of the Act, 21 U.S.C. section 360bbb-3(b)(1), unless the authorization is terminated or revoked sooner. Performed at [facility], [place] [address] [place], [place] [address]   Respiratory Panel by PCR     Status: None   Collection Time: 08/05/19  3:49 AM   Specimen: Nasopharyngeal Swab; Respiratory  Result Value Ref Range Status   Adenovirus NOT DETECTED NOT DETECTED Final   Coronavirus 229E NOT DETECTED NOT DETECTED Final    Comment: (NOTE) The Coronavirus on the Respiratory Panel, DOES NOT test for the novel  Coronavirus (2019 nCoV)    Coronavirus HKU1 NOT DETECTED NOT DETECTED Final   Coronavirus NL63 NOT DETECTED NOT DETECTED Final   Coronavirus OC43 NOT DETECTED NOT DETECTED Final   Metapneumovirus NOT  DETECTED NOT DETECTED Final   Rhinovirus / Enterovirus NOT DETECTED NOT DETECTED Final   Influenza A NOT DETECTED NOT DETECTED Final   Influenza B NOT DETECTED NOT DETECTED Final   Parainfluenza Virus 1 NOT DETECTED NOT DETECTED Final   Parainfluenza Virus 2 NOT DETECTED NOT DETECTED Final   Parainfluenza Virus 3 NOT DETECTED NOT DETECTED Final   Parainfluenza Virus 4 NOT DETECTED NOT DETECTED Final   Respiratory Syncytial Virus NOT DETECTED NOT DETECTED Final   Bordetella pertussis NOT DETECTED NOT DETECTED Final   Chlamydophila pneumoniae NOT DETECTED NOT DETECTED Final   Mycoplasma pneumoniae NOT DETECTED NOT DETECTED Final    Comment: Performed at [facility], [place] [address], [place], [place] [address]     Labs: Basic Metabolic Panel: Recent Labs  Lab 08/04/19 1617 08/04/19 2307 08/05/19 0123  NA 131* 132* 132*  K 4.4 4.1 4.1  CL 94* 96* 96*  CO2 28 26  --   GLUCOSE 108* 124* 103*  BUN  18 19 18   CREATININE 0.91 1.08 1.00  CALCIUM 9.6 9.6  --    Liver Function Tests: Recent Labs  Lab 08/04/19 1617  AST 20  ALT 12  ALKPHOS 53  BILITOT 0.4  PROT 7.5  ALBUMIN 3.4*   No results for input(s): LIPASE, AMYLASE in the last 168 hours. No results for input(s): AMMONIA in the last 168 hours. CBC: Recent Labs  Lab 08/04/19 1617 08/04/19 2307 08/05/19 0123  WBC 18.2 Repeated and verified X2.* 20.2*  --   NEUTROABS 9.3*  --   --   HGB 12.5* 12.5* 12.6*  HCT 38.0* 37.9* 37.0*  MCV 88.4 88.8  --   PLT 522.0* 424*  --    Cardiac Enzymes: No results for input(s): CKTOTAL, CKMB, CKMBINDEX, TROPONINI in the last 168 hours. BNP: BNP (last 3 results) No results for input(s): BNP in the last 8760 hours.  ProBNP (last 3 results) No results for input(s): PROBNP in the last 8760 hours.  CBG: No results for input(s): GLUCAP in the last 168 hours.  Principal Problem:   Multifocal pneumonia Active Problems:   Tinea cruris   Pneumonia   Time coordinating discharge: 38 minutes.  Signed:        Lateria Alderman, DO [facility]  08/06/2019, 5:33 PM

## 2019-08-06 NOTE — Progress Notes (Signed)
Went over discharge instructions w/ pt and his wife. The agreed w/ plan.

## 2019-08-06 NOTE — Telephone Encounter (Signed)
Patient stated that he missed a call from [facility] & would like a call back.

## 2019-08-06 NOTE — Telephone Encounter (Signed)
Left voicemail with patient-if he has further questions I can try to touch base with him at lunch if I have an opening or later this evening

## 2019-08-06 NOTE — Progress Notes (Signed)
Pharmacy IV to PO conversion  This patient is receiving azithromycin by the intravenous route. Based on criteria approved by the Pharmacy and Therapeutics Committee, and the Infectious Disease Division, the antibiotic(s) is/are being converted to equivalent oral dose form(s). These criteria include:   Patient being treated for a respiratory tract infection, urinary tract infection, cellulitis, or Clostridium Difficile Associated Diarrhea  The patient is not neutropenic and does not exhibit a GI malabsorption state  The patient is eating (either orally or per tube) and/or has been taking other orally administered medications for at least 24 hours.  The patient is improving clinically (physician assessment and a 24-hour Tmax of <=100.5 F)  If you have any questions about this conversion, please contact the Pharmacy Department (ext [phone]).  Thank you.  Reuel Boom, PharmD, BCPS [phone] 08/06/2019, 11:05 AM

## 2019-08-07 ENCOUNTER — Telehealth: Payer: Self-pay

## 2019-08-07 ENCOUNTER — Other Ambulatory Visit: Payer: Managed Care, Other (non HMO)

## 2019-08-07 NOTE — Telephone Encounter (Signed)
Transition Care Management Follow-up Telephone Call  Date of discharge and from where: [facility] 08/06/19  How have you been since you were released from the hospital? Stable   Any questions or concerns? Yes   Items Reviewed:  Did the pt receive and understand the discharge instructions provided? Yes   Medications obtained and verified? Yes   Any new allergies since your discharge? No   Dietary orders reviewed? Yes  Do you have support at home? Yes   Other (ie: DME, Home Health, etc) n/a   Functional Questionnaire: (I = Independent and D = Dependent) ADL's: Independent  Bathing/Dressing- Independent   Meal Prep- Indpendent  Eating- Independent  Maintaining continence- Independent   Transferring/Ambulation- Independent  Managing Meds- Independent    Follow up appointments reviewed:    PCP Hospital f/u appt confirmed? Yes  Scheduled to see Dr. Yong Channel on 1/25/1 @ [facility] f/u appt confirmed? N/a   Are transportation arrangements needed? No   If their condition worsens, is the pt aware to call  their PCP or go to the ED? Yes  Was the patient provided with contact information for the PCP's office or ED? Yes  Was the pt encouraged to call back with questions or concerns? Yes

## 2019-08-07 NOTE — Telephone Encounter (Signed)
Spoke to patient - we will continue our conversation on Monday

## 2019-08-07 NOTE — Telephone Encounter (Signed)
Called and spoke with patient and scheduled visit for 08/10/19.  He is still asking if you can give him a call before then to discuss some concerns with his admission.  TCM call documented

## 2019-08-10 ENCOUNTER — Encounter: Payer: Self-pay | Admitting: Family Medicine

## 2019-08-10 ENCOUNTER — Ambulatory Visit: Payer: Managed Care, Other (non HMO)

## 2019-08-10 ENCOUNTER — Ambulatory Visit (INDEPENDENT_AMBULATORY_CARE_PROVIDER_SITE_OTHER): Payer: Managed Care, Other (non HMO) | Admitting: Family Medicine

## 2019-08-10 ENCOUNTER — Other Ambulatory Visit: Payer: Self-pay

## 2019-08-10 VITALS — BP 110/70 | HR 74 | Temp 97.6°F | Ht 74.0 in | Wt 183.4 lb

## 2019-08-10 DIAGNOSIS — J189 Pneumonia, unspecified organism: Secondary | ICD-10-CM

## 2019-08-10 DIAGNOSIS — J4550 Severe persistent asthma, uncomplicated: Secondary | ICD-10-CM

## 2019-08-10 DIAGNOSIS — E785 Hyperlipidemia, unspecified: Secondary | ICD-10-CM

## 2019-08-10 DIAGNOSIS — I700 Atherosclerosis of aorta: Secondary | ICD-10-CM

## 2019-08-10 DIAGNOSIS — B356 Tinea cruris: Secondary | ICD-10-CM

## 2019-08-10 DIAGNOSIS — I2584 Coronary atherosclerosis due to calcified coronary lesion: Secondary | ICD-10-CM

## 2019-08-10 DIAGNOSIS — I2510 Atherosclerotic heart disease of native coronary artery without angina pectoris: Secondary | ICD-10-CM

## 2019-08-10 LAB — CBC WITH DIFFERENTIAL/PLATELET
Basophils Absolute: 0.1 10*3/uL (ref 0.0–0.1)
Basophils Relative: 0.4 % (ref 0.0–3.0)
Eosinophils Absolute: 6.5 10*3/uL — ABNORMAL HIGH (ref 0.0–0.7)
Eosinophils Relative: 36.3 % — ABNORMAL HIGH (ref 0.0–5.0)
HCT: 41 % (ref 39.0–52.0)
Hemoglobin: 13.5 g/dL (ref 13.0–17.0)
Lymphocytes Relative: 8.8 % — ABNORMAL LOW (ref 12.0–46.0)
Lymphs Abs: 1.6 10*3/uL (ref 0.7–4.0)
MCHC: 32.9 g/dL (ref 30.0–36.0)
MCV: 87.6 fl (ref 78.0–100.0)
Monocytes Absolute: 0.8 10*3/uL (ref 0.1–1.0)
Monocytes Relative: 4.6 % (ref 3.0–12.0)
Neutro Abs: 8.9 10*3/uL — ABNORMAL HIGH (ref 1.4–7.7)
Neutrophils Relative %: 49.9 % (ref 43.0–77.0)
Platelets: 615 10*3/uL — ABNORMAL HIGH (ref 150.0–400.0)
RBC: 4.69 Mil/uL (ref 4.22–5.81)
RDW: 12.6 % (ref 11.5–15.5)
WBC: 17.9 10*3/uL — ABNORMAL HIGH (ref 4.0–10.5)

## 2019-08-10 LAB — COMPREHENSIVE METABOLIC PANEL
ALT: 28 U/L (ref 0–53)
AST: 33 U/L (ref 0–37)
Albumin: 3.7 g/dL (ref 3.5–5.2)
Alkaline Phosphatase: 58 U/L (ref 39–117)
BUN: 20 mg/dL (ref 6–23)
CO2: 28 mEq/L (ref 19–32)
Calcium: 10.1 mg/dL (ref 8.4–10.5)
Chloride: 96 mEq/L (ref 96–112)
Creatinine, Ser: 0.94 mg/dL (ref 0.40–1.50)
GFR: 80.48 mL/min (ref >60.00)
Glucose, Bld: 102 mg/dL — ABNORMAL HIGH (ref 70–99)
Potassium: 4.4 mEq/L (ref 3.5–5.1)
Sodium: 133 mEq/L — ABNORMAL LOW (ref 135–145)
Total Bilirubin: 0.3 mg/dL (ref 0.2–1.2)
Total Protein: 8.4 g/dL — ABNORMAL HIGH (ref 6.0–8.3)

## 2019-08-10 LAB — CULTURE, BLOOD (ROUTINE X 2)
Culture: NO GROWTH
Culture: NO GROWTH
Special Requests: ADEQUATE
Special Requests: ADEQUATE

## 2019-08-10 NOTE — Assessment & Plan Note (Addendum)
Initially thought asthma may have been the cause of shortness of breath but patient responded very well to antibiotics.  Patient reports minimal wheezing-only has used albuterol twice since getting home from hospital.  Memory Dance and Spiriva will be continued with sparing albuterol-he will follow up with pulmonary at next visit

## 2019-08-10 NOTE — Assessment & Plan Note (Signed)
Noted on CTA January 2021.  I sent patient a message after the visit discussing importance of minimizing risk factors.  We need to schedule a physical at some point in target LDL under 70.  Blood pressure is very well controlled

## 2019-08-10 NOTE — Patient Instructions (Addendum)
Schedule visit in 6 weeks for chest x- ray  I suspect you will continue to improve slowly over next 2-3 weeks- if symptoms worsen or fail to continue to improve please let us know  Please stop by lab before you go If you do not have mychart- we will call you about results within 5 business days of [place] receiving them.  If you have mychart- we will send your results within 3 business days of [place] receiving them.  If abnormal or we want to clarify a result, we will call or mychart you to make sure you receive the message.  If you have questions or concerns or don't hear within 5-7 days, please send [place] a message or call us.

## 2019-08-10 NOTE — Assessment & Plan Note (Signed)
We did not reexamine today but patient has significant improvement with antifungals-we will continue to monitor.  I do not feel that he needs to complete scrotal ultrasound unless recurrent issues that are now responsive to antifungals

## 2019-08-10 NOTE — Assessment & Plan Note (Signed)
Noted on CTA January 2021.  We discussed at the visit importance of minimizing risk factors.  Blood pressure well controlled.  No history of diabetes.  Need to target LDL under 70

## 2019-08-10 NOTE — Assessment & Plan Note (Signed)
Mild poor control last check.  Currently on simvastatin 20 mg.  We will need to get a physical sometime in the next 6 months-consider more aggressive statin if LDL above 70

## 2019-08-10 NOTE — Progress Notes (Signed)
Phone [phone]   Subjective:  Edward Lee is a 64 y.o. year old very pleasant male patient who presents for transitional care management and hospital follow up for Pneumonia. Patient was hospitalized from 08/05/19 to 08/07/19. A TCM phone call was completed on 08/07/19. Medical complexity moderate   Patient is 64 year old male who was hospitalized last week when he came to the office with 90% pulse oximetry and significant shortness of breath with concern for pulmonary embolism versus COVID-19 infection-ultimately discovered to have community-acquired pneumonia.  Patient traces initial illness to around Christmas with symptoms worsening significantly a week prior to seeing me last week.  D-dimer in the office was elevated and patient was sent to the hospital to rule out COVID-19 as well as pulmonary embolism.  CTA was negative for pulmonary embolism but showed multifocal pneumonia.  COVID-19 rapid and PCR testing was negative and he had not had any recent COVID-19 contacts so this was not deemed a case of COVID-19, respiratory viral panel also reviewed and negative.  For multifocal pneumonia patient was treated with Augmentin and azithromycin-he had recently taken azithromycin at home but due to atypical appearance of pneumonia they opted to repeat it.  Blood cultures are negative with no growth to date.  Hyponatremia noted-sodium was low while hospitalized but was improving up to 132 before discharge-may have reflected some dilutional effect as hemoglobin also noted to be low before discharge..  Patient did have some leukocytosis and mild anemia as well as high platelets.  Patient does have asthma at baseline and was continued on Breo and Spiriva.  With COVID-19 test being negative Decadron was not given.  Patient never took the prednisone that I ordered when I sent him on last visit and we discussed to remain off this since he is not having any wheezing.  At last office visit patient had  significant scrotal edema-it was thought this was related to tinea cruris as improved rapidly with antifungal.  Still interesting how rapidly it evolved and how quickly it resolved.   Patient reports today is the best day so far since getting out of the hospital-breathing has improved.  Oxygen levels look excellent at 96% today without oxygen.  Patient had not seen his chest x-ray or CT scan.  I walked him through both sets of imaging and independently reviewed both films 1.  Chest x-ray from August 04, 2019-no bony abnormalities.  Heart size normal.  No evidence of pleural effusion or pneumothorax.  There is consolidation in bilateral lungs-appears most concentrated in upper lobes-groundglass appearance noted. 2.  CT angiogram of chest August 05, 2019-no pulmonary embolism was noted.  Patient does have bilateral opacities/consolidations-noted in bilateral upper lobes with some into right middle lobe and lingula as well as slight amount in superior segment of bilateral lower lobes.-Groundglass appearance.  Does have signs of atherosclerosis with aortic atherosclerosis as well as coronary artery calcifications.  Lymphadenopathy noted bilaterally.  Portions of upper abdomen imaged were largely normal.  No bony abnormalities.   See problem oriented charting as well  This visit occurred during the SARS-CoV-2 public health emergency.  Safety protocols were in place, including screening questions prior to the visit, additional usage of staff PPE, and extensive cleaning of exam room while observing appropriate contact time as indicated for disinfecting solutions.   Past Medical History-  Patient Active Problem List   Diagnosis Date Noted  . Multifocal pneumonia 08/05/2019    Priority: High  . Severe persistent asthma without complication [id number]  Priority: High  . Malignant neoplasm of prostate ([place]) 04/26/2015    Priority: High  . Aortic atherosclerosis ([place]) 08/10/2019    Priority: Medium  .  Coronary artery calcification 08/10/2019    Priority: Medium  . Tinea cruris 08/05/2019    Priority: Medium  . Chronic low back pain 01/10/2017    Priority: Medium  . Urinary frequency 11/28/2011    Priority: Medium  . Hyperlipidemia 03/21/2011    Priority: Medium  . PTSD (post-traumatic stress disorder) 09/06/2010    Priority: Medium  . Recurrent maxillary sinusitis 11/17/2018    Priority: Low  . Other allergic rhinitis 11/17/2018    Priority: Low  . IBS (irritable bowel syndrome)     Priority: Low    Medications- reviewed and updated  A medical reconciliation was performed comparing current medicines to hospital discharge medications. Current Outpatient Medications  Medication Sig Dispense Refill  . albuterol (VENTOLIN HFA) 108 (90 Base) MCG/ACT inhaler Inhale 2 puffs into the lungs every 6 (six) hours as needed for wheezing or shortness of breath. 18 g 2  . amoxicillin-clavulanate (AUGMENTIN) 875-125 MG tablet Take 1 tablet by mouth 2 (two) times daily for 8 days. 16 tablet 0  . Ascorbic Acid (VITAMIN C) 1000 MG tablet Take 1,000 mg by mouth daily.    . citalopram (CELEXA) 40 MG tablet TAKE 1 TABLET DAILY (Patient taking differently: Take 40 mg by mouth daily. ) 90 tablet 3  . fluticasone furoate-vilanterol (BREO ELLIPTA) 200-25 MCG/INH AEPB Inhale 1 puff into the lungs daily. 180 each 3  . Multiple Vitamin (MULTIVITAMIN WITH MINERALS) TABS tablet Take 1 tablet by mouth daily.    Marland Kitchen nystatin cream (MYCOSTATIN) Apply topically 3 (three) times daily. 30 g 0  . simvastatin (ZOCOR) 20 MG tablet TAKE 1 TABLET AT BEDTIME (Patient taking differently: Take 20 mg by mouth daily. ) 90 tablet 2  . Tiotropium Bromide Monohydrate (SPIRIVA RESPIMAT) 2.5 MCG/ACT AERS Inhale 2 puffs into the lungs daily. 3 Inhaler 3  . XHANCE 93 MCG/ACT EXHU BLOW 2 DOSES IN EACH NOSTRIL TWICE DAILY (Patient taking differently: Place 2 Doses into both nostrils 2 (two) times daily as needed (nasal). ) 32 mL 12    No current facility-administered medications for this visit.   Objective  Objective:  BP 110/70   Pulse 74   Temp 97.6 F (36.4 C)   Ht 6\' 2"  (1.88 m)   Wt 183 lb 6.4 oz (83.2 kg)   SpO2 96%   BMI 23.55 kg/m  Gen: NAD, resting comfortably Mask was not removed due to COVID-19 CV: RRR no murmurs rubs or gallops Lungs: CTAB no crackles, wheeze, rhonchi.  There is no auditory evidence of pneumonia on last exam or today. Abdomen: soft/nontender/nondistended/normal bowel sounds. No rebound or guarding.  Ext: no edema Skin: warm, dry Neuro: grossly normal, moves all extremities    Assessment and Plan:   Multifocal pneumonia With hypoxemia and shortness of breath-last week we sent to hospital for elevated D-dimer with concern of pulmonary embolism or COVID-19-both were negative.  Had extensive multifocal pneumonia.  Patient has had significant improvement with oxygen levels from 89 to 90% up to 96% at this time.  He has completed a course of azithromycin from the hospitalist.  He is still completing his course of Augmentin-appears largely resolved.  We opted to get repeat chest x-ray in 6 weeks due to extent of pneumonia-patient can also follow-up with his pulmonologist at next visit.  Severe persistent asthma without complication Initially  thought asthma may have been the cause of shortness of breath but patient responded very well to antibiotics.  Patient reports minimal wheezing-only has used albuterol twice since getting home from hospital.  Memory Dance and Spiriva will be continued with sparing albuterol-he will follow up with pulmonary at next visit  Tinea cruris We did not reexamine today but patient has significant improvement with antifungals-we will continue to monitor.  I do not feel that he needs to complete scrotal ultrasound unless recurrent issues that are now responsive to antifungals  Aortic atherosclerosis [facility]) Noted on CTA January 2021.  I sent patient a message after the  visit discussing importance of minimizing risk factors.  We need to schedule a physical at some point in target LDL under 70.  Blood pressure is very well controlled  Coronary artery calcification Noted on CTA January 2021.  We discussed at the visit importance of minimizing risk factors.  Blood pressure well controlled.  No history of diabetes.  Need to target LDL under 70  Hyperlipidemia Mild poor control last check.  Currently on simvastatin 20 mg.  We will need to get a physical sometime in the next 6 months-consider more aggressive statin if LDL above 70   Recommended follow up: Would advise physical sometime in the next 6 months Future Appointments  Date Time Provider [place]  09/22/2019 10:00 AM [facility]    Lab/Order associations:   ICD-10-CM   1. Community acquired pneumonia, unspecified laterality  J18.9 DG Chest 2 View    CBC with Differential/Platelet    Comprehensive metabolic panel  2. Multifocal pneumonia  J18.9   3. Severe persistent asthma without complication  [id number]   4. Tinea cruris  B35.6   5. Aortic atherosclerosis (H[facility])  I70.0   6. Coronary artery calcification  I25.10    I25.84   7. Hyperlipidemia, unspecified hyperlipidemia type  E78.5    Return precautions advised.  [name], MD

## 2019-08-10 NOTE — Assessment & Plan Note (Signed)
With hypoxemia and shortness of breath-last week we sent to hospital for elevated D-dimer with concern of pulmonary embolism or COVID-19-both were negative.  Had extensive multifocal pneumonia.  Patient has had significant improvement with oxygen levels from 89 to 90% up to 96% at this time.  He has completed a course of azithromycin from the hospitalist.  He is still completing his course of Augmentin-appears largely resolved.  We opted to get repeat chest x-ray in 6 weeks due to extent of pneumonia-patient can also follow-up with his pulmonologist at next visit.

## 2019-08-27 ENCOUNTER — Other Ambulatory Visit: Payer: Self-pay | Admitting: Pulmonary Disease

## 2019-09-07 LAB — PSA: PSA: 0.55

## 2019-09-15 ENCOUNTER — Other Ambulatory Visit: Payer: Self-pay

## 2019-09-15 ENCOUNTER — Encounter: Payer: Self-pay | Admitting: Family Medicine

## 2019-09-15 DIAGNOSIS — D72829 Elevated white blood cell count, unspecified: Secondary | ICD-10-CM

## 2019-09-18 ENCOUNTER — Encounter: Payer: Self-pay | Admitting: Family Medicine

## 2019-09-21 ENCOUNTER — Other Ambulatory Visit: Payer: Self-pay

## 2019-09-21 DIAGNOSIS — J189 Pneumonia, unspecified organism: Secondary | ICD-10-CM

## 2019-09-22 ENCOUNTER — Ambulatory Visit (INDEPENDENT_AMBULATORY_CARE_PROVIDER_SITE_OTHER): Payer: 59

## 2019-09-22 ENCOUNTER — Other Ambulatory Visit (INDEPENDENT_AMBULATORY_CARE_PROVIDER_SITE_OTHER): Payer: 59

## 2019-09-22 ENCOUNTER — Other Ambulatory Visit: Payer: Self-pay

## 2019-09-22 DIAGNOSIS — D72829 Elevated white blood cell count, unspecified: Secondary | ICD-10-CM

## 2019-09-22 DIAGNOSIS — J189 Pneumonia, unspecified organism: Secondary | ICD-10-CM

## 2019-09-22 LAB — CBC WITH DIFFERENTIAL/PLATELET
Basophils Absolute: 0.1 10*3/uL (ref 0.0–0.1)
Basophils Relative: 0.6 % (ref 0.0–3.0)
Eosinophils Absolute: 1.5 10*3/uL — ABNORMAL HIGH (ref 0.0–0.7)
Eosinophils Relative: 15.8 % — ABNORMAL HIGH (ref 0.0–5.0)
HCT: 35.1 % — ABNORMAL LOW (ref 39.0–52.0)
Hemoglobin: 11.5 g/dL — ABNORMAL LOW (ref 13.0–17.0)
Lymphocytes Relative: 19.4 % (ref 12.0–46.0)
Lymphs Abs: 1.9 10*3/uL (ref 0.7–4.0)
MCHC: 32.7 g/dL (ref 30.0–36.0)
MCV: 84.1 fl (ref 78.0–100.0)
Monocytes Absolute: 0.8 10*3/uL (ref 0.1–1.0)
Monocytes Relative: 8.4 % (ref 3.0–12.0)
Neutro Abs: 5.4 10*3/uL (ref 1.4–7.7)
Neutrophils Relative %: 55.8 % (ref 43.0–77.0)
Platelets: 292 10*3/uL (ref 150.0–400.0)
RBC: 4.18 Mil/uL — ABNORMAL LOW (ref 4.22–5.81)
RDW: 14.3 % (ref 11.5–15.5)
WBC: 9.6 10*3/uL (ref 4.0–10.5)

## 2019-09-23 NOTE — Progress Notes (Signed)
White blood cells normalized. Eosinophils trending down significantly.   Mild anemia noted- that appears slightly worse than prior- team please set him up for FIT/hemocult testing to make sure no blood in stool and order another cbc under anemia unspecified in 1 month.

## 2019-09-24 ENCOUNTER — Other Ambulatory Visit: Payer: Self-pay

## 2019-09-24 DIAGNOSIS — D649 Anemia, unspecified: Secondary | ICD-10-CM

## 2019-09-25 ENCOUNTER — Encounter: Payer: Self-pay | Admitting: Pulmonary Disease

## 2019-09-25 ENCOUNTER — Ambulatory Visit: Payer: 59 | Admitting: Pulmonary Disease

## 2019-09-25 ENCOUNTER — Other Ambulatory Visit: Payer: Self-pay

## 2019-09-25 VITALS — BP 120/68 | HR 68 | Temp 98.0°F | Ht 74.0 in | Wt 183.6 lb

## 2019-09-25 DIAGNOSIS — J4550 Severe persistent asthma, uncomplicated: Secondary | ICD-10-CM | POA: Diagnosis not present

## 2019-09-25 MED ORDER — BREO ELLIPTA 200-25 MCG/INH IN AEPB: 1.0000 | INHALATION_SPRAY | Freq: Every day | RESPIRATORY_TRACT | 3 refills | Status: DC

## 2019-09-25 MED ORDER — ALBUTEROL SULFATE HFA 108 (90 BASE) MCG/ACT IN AERS: 2.0000 | INHALATION_SPRAY | Freq: Four times a day (QID) | RESPIRATORY_TRACT | 3 refills | Status: DC | PRN

## 2019-09-25 MED ORDER — SPIRIVA RESPIMAT 2.5 MCG/ACT IN AERS: INHALATION_SPRAY | RESPIRATORY_TRACT | 3 refills | Status: DC

## 2019-09-25 NOTE — Progress Notes (Signed)
Edward Lee    [id number]    10-15-1955  Primary Care Physician:Hunter, Brayton Mars, MD  Referring Physician: [name], MD [name],  [name] [address]  Chief complaint:  Follow up for asthma  HPI: 64 year old with history of allergies, hyperlipidemia, irritable bowel syndrome, prostate cancer.  Complains of increasing dyspnea since September 2018 after return from travel to [place].  Symptoms associated with chest congestion, wheezing mostly at night, nonproductive cough.  Denies any sputum production, fevers, chills.  He was treated with several rounds of prednisone which improves symptoms temporarily.  Started on Flovent in December 2018 by Dr. Yong Channel, primary care.   Referred from recurrent exacerbations throughout 2019 Inhalers changed to Breo and Spiriva. He has history of seasonal allergies, denies acid reflux.  There is a strong history of asthma in the family.    Pets: Dogs, no cats, birds Occupation: Works for the [employer].  Works from home Exposures: No known exposures.  No mold at home.  Hardwood, carpet at home.  Forced air heating Smoking history: Never smoker  Travel History: Traveled to [place], [place] recently.  No other significant travel.  Interim History: Doing well with his breathing.  Stable on inhaler regimen  Has hospitalized for community-acquired pneumonia at the end of January. He tested negative for COVID 19 and was treated with azithromycin, Augmentin with improvement in symptoms Follow-up chest x-ray on 3/9 shows improvement in pulmonary infiltrates  Outpatient Encounter Medications as of 09/25/2019  Medication Sig  . albuterol (VENTOLIN HFA) 108 (90 Base) MCG/ACT inhaler Inhale 2 puffs into the lungs every 6 (six) hours as needed for wheezing or shortness of breath.  . Ascorbic Acid (VITAMIN C) 1000 MG tablet Take 1,000 mg by mouth daily.  Marland Kitchen BREO ELLIPTA 200-25 MCG/INH AEPB USE 1 INHALATION ORALLY     DAILY  . citalopram (CELEXA) 40 MG tablet TAKE 1 TABLET DAILY (Patient taking differently: Take 40 mg by mouth daily. )  . Multiple Vitamin (MULTIVITAMIN WITH MINERALS) TABS tablet Take 1 tablet by mouth daily.  Marland Kitchen nystatin cream (MYCOSTATIN) Apply topically 3 (three) times daily.  . simvastatin (ZOCOR) 20 MG tablet TAKE 1 TABLET AT BEDTIME (Patient taking differently: Take 20 mg by mouth daily. )  . SPIRIVA RESPIMAT 2.5 MCG/ACT AERS USE 2 INHALATIONS ORALLY   DAILY  . XHANCE 93 MCG/ACT EXHU BLOW 2 DOSES IN EACH NOSTRIL TWICE DAILY (Patient taking differently: Place 2 Doses into both nostrils 2 (two) times daily as needed (nasal). )   No facility-administered encounter medications on file as of 09/25/2019.   Physical Exam: Blood pressure 120/68, pulse 68, temperature 98 F (36.7 C), temperature source Temporal, height 6\' 2"  (1.88 m), weight 183 lb 9.6 oz (83.3 kg), SpO2 97 %. Gen:      No acute distress HEENT:  EOMI, sclera anicteric Neck:     No masses; no thyromegaly Lungs:    Clear to auscultation bilaterally; normal respiratory effort CV:         Regular rate and rhythm; no murmurs Abd:      + bowel sounds; soft, non-tender; no palpable masses, no distension Ext:    No edema; adequate peripheral perfusion Skin:      Warm and dry; no rash Neuro: alert and oriented x 3 Psych: normal mood and affect  Data Reviewed: Imaging: Chest x-ray 08/04/2019-bilateral groundglass opacities, consolidation. CTA 08/05/2019-no pulmonary embolism, extensive upper lobe predominant bilateral consolidation groundglass  with air bronchograms.  Mediastinal lymphadenopathy Chest x-ray 09/22/19-provement in lung opacities with streaky residual opacities in the upper lobe. I have reviewed the images personally.  PFTs: 08/27/17 FVC 6.18 [117%], FEV1 5.09 [128%], F/F 82, TLC 112%, DLCO 94% Normal study.  FENO  07/25/17- 54 08/27/2017-85 11/22/2017-99 11/28/2017-157 02/27/2018-55  ACQ 7 02/12/2019- 0.43 ACT score  09/25/2019-23  Labs: CBC 12/14/14-WBC count 6.6, absolute eosinophil count 400  CBC 07/25/17-WBC count 9.1, absolute eosinophil count 500 Blood allergy profile 07/25/17-IgE 58, RAST panel is negative.  Assessment:  Severe persistent asthma Symptoms are consistent with asthma with elevated FENO and CBCs showing eosinophilia. PFTs reviewed which do not show any obstruction.  There is improvement in mid flow rates post albuterol indicating small airways disease  Continues on Breo and Spiriva. After frequent exacerbations in 2019 his symptoms appear to have settled down.  His asthma is under good control We will continue to monitor We had discussed biologics in the past but since he is better we will defer this therapy.  Multilobar currently acquired pneumonia Improved with antibiotics Chest x-ray from 3/9 reviewed with improving pulmonary opacities.   Plan/Recommendations: - Continue Breo, Spiriva  Follow-up in 1 year  [name] MD [facility] 09/25/2019, 2:49 PM  CC: [name], MD

## 2019-09-25 NOTE — Patient Instructions (Signed)
I am glad you are doing well with regard to your breathing Continue your inhalers We will call in a refill for your medications Follow-up in 1 year

## 2019-10-27 ENCOUNTER — Other Ambulatory Visit (INDEPENDENT_AMBULATORY_CARE_PROVIDER_SITE_OTHER): Payer: 59

## 2019-10-27 ENCOUNTER — Other Ambulatory Visit: Payer: Self-pay

## 2019-10-27 DIAGNOSIS — D649 Anemia, unspecified: Secondary | ICD-10-CM | POA: Diagnosis not present

## 2019-10-27 LAB — CBC WITH DIFFERENTIAL/PLATELET
Basophils Absolute: 0.1 10*3/uL (ref 0.0–0.1)
Basophils Relative: 0.6 % (ref 0.0–3.0)
Eosinophils Absolute: 1.1 10*3/uL — ABNORMAL HIGH (ref 0.0–0.7)
Eosinophils Relative: 10.5 % — ABNORMAL HIGH (ref 0.0–5.0)
HCT: 42 % (ref 39.0–52.0)
Hemoglobin: 13.7 g/dL (ref 13.0–17.0)
Lymphocytes Relative: 20.5 % (ref 12.0–46.0)
Lymphs Abs: 2.1 10*3/uL (ref 0.7–4.0)
MCHC: 32.7 g/dL (ref 30.0–36.0)
MCV: 85.2 fl (ref 78.0–100.0)
Monocytes Absolute: 0.6 10*3/uL (ref 0.1–1.0)
Monocytes Relative: 6.1 % (ref 3.0–12.0)
Neutro Abs: 6.2 10*3/uL (ref 1.4–7.7)
Neutrophils Relative %: 62.3 % (ref 43.0–77.0)
Platelets: 232 10*3/uL (ref 150.0–400.0)
RBC: 4.93 Mil/uL (ref 4.22–5.81)
RDW: 18.5 % — ABNORMAL HIGH (ref 11.5–15.5)
WBC: 10 10*3/uL (ref 4.0–10.5)

## 2019-11-02 ENCOUNTER — Other Ambulatory Visit: Payer: Self-pay | Admitting: Family Medicine

## 2019-11-12 ENCOUNTER — Encounter: Payer: Self-pay | Admitting: Family Medicine

## 2019-12-07 ENCOUNTER — Ambulatory Visit (INDEPENDENT_AMBULATORY_CARE_PROVIDER_SITE_OTHER): Payer: 59 | Admitting: Otolaryngology

## 2019-12-07 ENCOUNTER — Encounter (INDEPENDENT_AMBULATORY_CARE_PROVIDER_SITE_OTHER): Payer: Self-pay | Admitting: Otolaryngology

## 2019-12-07 ENCOUNTER — Other Ambulatory Visit: Payer: Self-pay

## 2019-12-07 VITALS — Temp 97.9°F

## 2019-12-07 DIAGNOSIS — J310 Chronic rhinitis: Secondary | ICD-10-CM | POA: Diagnosis not present

## 2019-12-07 NOTE — Progress Notes (Signed)
HPI: Edward Lee is a 64 y.o. male who returns today for evaluation of sinus complaints.  He complains mostly of chronic nasal congestion and sometimes postnasal drainage.  He sees allergy and asthma next-door in has been prescribed [facility] which he has not been using.  He states that a short course of steroids seems to help him the most.  He is also followed by pulmonary.  He apparently had a bad pneumonia back in January. He is doing fairly well today and asked for a prescription for prednisone. He had previously been followed by Dr. Ernesto Rutherford and had a CT scan performed of his sinuses prior to Dr. Ernesto Rutherford retiring and this showed clear paranasal sinuses according to the patient. He last saw me about a year ago and I recommended Flonase but he has not been using this spray either.  Past Medical History:  Diagnosis Date  . Allergy   . Chronic lower back pain   . Herniated disc    L1  . Hypercholesteremia   . IBS (irritable bowel syndrome)    ? possible  . Prostate cancer [facility])    Past Surgical History:  Procedure Laterality Date  . CATARACT EXTRACTION    . COLONOSCOPY    . PILONIDAL CYST EXCISION  64 yrs old  . PROSTATE BIOPSY  02/11/15   Social History   Socioeconomic History  . Marital status: Married    Spouse name: Not on file  . Number of children: 2  . Years of education: Not on file  . Highest education level: Not on file  Occupational History  . Occupation: Buyer, retail  Tobacco Use  . Smoking status: Never Smoker  . Smokeless tobacco: Never Used  Substance and Sexual Activity  . Alcohol use: Yes    Alcohol/week: 14.0 - 15.0 standard drinks    Types: 7 - 8 Glasses of wine, 7 Standard drinks or equivalent per week    Comment: occ  . Drug use: No  . Sexual activity: Yes  Other Topics Concern  . Not on file  Social History Narrative   Married 32. Son with downs syndrome and daughter is deaf.       Works for [employer] from home.       Hobbies: enjoys walking/exercise, painting, gardening.    Social Determinants of Health   Financial Resource Strain:   . Difficulty of Paying Living Expenses:   Food Insecurity:   . Worried About Charity fundraiser in the Last Year:   . Arboriculturist in the Last Year:   Transportation Needs:   . Film/video editor (Medical):   Marland Kitchen Lack of Transportation (Non-Medical):   Physical Activity:   . Days of Exercise per Week:   . Minutes of Exercise per Session:   Stress:   . Feeling of Stress :   Social Connections:   . Frequency of Communication with Friends and Family:   . Frequency of Social Gatherings with Friends and Family:   . Attends Religious Services:   . Active Member of Clubs or Organizations:   . Attends Archivist Meetings:   Marland Kitchen Marital Status:    Family History  Problem Relation Age of Onset  . Breast cancer Mother        survived without recurrence  . Prostate cancer Father        around age 40 -surgeyr  . Dementia Father        died from this at age  14  . Healthy Sister   . Healthy Brother   . Hemochromatosis Son        none in patient  . Down syndrome Son   . Colon cancer Neg Hx   . Esophageal cancer Neg Hx   . Rectal cancer Neg Hx   . Stomach cancer Neg Hx   . Pancreatic cancer Neg Hx    Allergies  Allergen Reactions  . Codeine     REACTION: nausea vomiting   Prior to Admission medications   Medication Sig Start Date End Date Taking? Authorizing Provider  albuterol (VENTOLIN HFA) 108 (90 Base) MCG/ACT inhaler Inhale 2 puffs into the lungs every 6 (six) hours as needed for wheezing or shortness of breath. 09/25/19  Yes Mannam, Praveen, MD  Ascorbic Acid (VITAMIN C) 1000 MG tablet Take 1,000 mg by mouth daily.   Yes [provider]  citalopram (CELEXA) 40 MG tablet TAKE 1 TABLET DAILY Patient taking differently: Take 40 mg by mouth daily.  05/18/19  Yes [name], MD  fluticasone furoate-vilanterol (BREO ELLIPTA) 200-25  MCG/INH AEPB Inhale 1 puff into the lungs daily. 09/25/19  Yes Mannam, Praveen, MD  Multiple Vitamin (MULTIVITAMIN WITH MINERALS) TABS tablet Take 1 tablet by mouth daily.   Yes [provider]  nystatin cream (MYCOSTATIN) Apply topically 3 (three) times daily. 08/06/19  Yes Swayze, Ava, DO  simvastatin (ZOCOR) 20 MG tablet TAKE 1 TABLET AT BEDTIME 11/02/19  Yes [name], MD  Tiotropium Bromide Monohydrate (SPIRIVA RESPIMAT) 2.5 MCG/ACT AERS USE 2 INHALATIONS ORALLY   DAILY 09/25/19  Yes Mannam, Praveen, MD  XHANCE 93 MCG/ACT EXHU BLOW 2 DOSES IN EACH NOSTRIL TWICE DAILY Patient not taking: No sig reported 02/06/19   Garnet Sierras, DO     Positive ROS: Otherwise negative  All other systems have been reviewed and were otherwise negative with the exception of those mentioned in the HPI and as above.  Physical Exam: Constitutional: Alert, well-appearing, no acute distress Ears: External ears without lesions or tenderness. Ear canals are clear bilaterally with intact, clear TMs.  Nasal: External nose without lesions. Septum midline with mild rhinitis.  Both middle meatus regions were edematous but no polyps noted and no gross mucopurulent discharge noted..  Oral: Lips and gums without lesions. Tongue and palate mucosa without lesions. Posterior oropharynx clear. Neck: No palpable adenopathy or masses Respiratory: Breathing comfortably  Skin: No facial/neck lesions or rash noted.  Procedures  Assessment: Chronic rhinitis  Plan: Recommended regular use of nasal steroid spray either XHANCE or Flonase 2 sprays each nostril at night. I gave him a prescription for prednisone with a 6-day taper.  10 mg tablets #21.  But would not recommend regular use prednisone tablets except for occasional use. I would recommend regular use of nasal steroid spray. He will follow-up as needed.   Radene Journey, MD

## 2019-12-15 ENCOUNTER — Other Ambulatory Visit: Payer: Self-pay | Admitting: Pulmonary Disease

## 2020-04-06 ENCOUNTER — Telehealth: Payer: Self-pay | Admitting: Pulmonary Disease

## 2020-04-06 MED ORDER — BREO ELLIPTA 200-25 MCG/INH IN AEPB: INHALATION_SPRAY | RESPIRATORY_TRACT | 3 refills | Status: DC

## 2020-04-06 NOTE — Telephone Encounter (Signed)
ATC patient, left VM letting patient know his refill for Memory Dance has been sent to his pharmacy and to call our office with any questions.

## 2020-04-15 ENCOUNTER — Encounter: Payer: Self-pay | Admitting: Family Medicine

## 2020-04-15 ENCOUNTER — Other Ambulatory Visit: Payer: Self-pay

## 2020-04-15 MED ORDER — SIMVASTATIN 20 MG PO TABS
20.0000 mg | ORAL_TABLET | Freq: Every day | ORAL | 0 refills | Status: DC
Start: 2020-04-15 — End: 2020-08-01

## 2020-04-15 MED ORDER — CITALOPRAM HYDROBROMIDE 40 MG PO TABS: 40.0000 mg | ORAL_TABLET | Freq: Every day | ORAL | 0 refills | Status: DC

## 2020-04-18 DIAGNOSIS — C61 Malignant neoplasm of prostate: Secondary | ICD-10-CM | POA: Diagnosis not present

## 2020-04-25 DIAGNOSIS — N411 Chronic prostatitis: Secondary | ICD-10-CM | POA: Diagnosis not present

## 2020-04-25 DIAGNOSIS — C61 Malignant neoplasm of prostate: Secondary | ICD-10-CM | POA: Diagnosis not present

## 2020-05-05 DIAGNOSIS — C61 Malignant neoplasm of prostate: Secondary | ICD-10-CM | POA: Diagnosis not present

## 2020-05-06 ENCOUNTER — Ambulatory Visit (INDEPENDENT_AMBULATORY_CARE_PROVIDER_SITE_OTHER): Payer: Medicare Other | Admitting: Otolaryngology

## 2020-05-06 ENCOUNTER — Other Ambulatory Visit: Payer: Self-pay

## 2020-05-06 DIAGNOSIS — H6505 Acute serous otitis media, recurrent, left ear: Secondary | ICD-10-CM

## 2020-05-06 DIAGNOSIS — H6982 Other specified disorders of Eustachian tube, left ear: Secondary | ICD-10-CM | POA: Diagnosis not present

## 2020-05-06 DIAGNOSIS — J339 Nasal polyp, unspecified: Secondary | ICD-10-CM

## 2020-05-06 NOTE — Progress Notes (Signed)
HPI: Edward Lee is a 64 y.o. male who returns today for evaluation of complaints of recurrent left serous otitis media.  He is also noticed decreased sense of smell and taste.  He presently uses Flonase and Allegra.  He had a previous myringotomy performed in the left ear over a year ago.  I had previously prescribed XHANCE to use but he is presently using Flonase.  He has had previous allergy testing but apparently was negative for allergies.  His symptoms improved dramatically when he takes steroids but limits the amount of steroids he takes.  Usually 20 mg tablet will do well for him. He does have history of asthma and avoids aspirin products..  Past Medical History:  Diagnosis Date  . Allergy   . Chronic lower back pain   . Herniated disc    L1  . Hypercholesteremia   . IBS (irritable bowel syndrome)    ? possible  . Prostate cancer [facility])    Past Surgical History:  Procedure Laterality Date  . CATARACT EXTRACTION    . COLONOSCOPY    . PILONIDAL CYST EXCISION  64 yrs old  . PROSTATE BIOPSY  02/11/15   Social History   Socioeconomic History  . Marital status: Married    Spouse name: Not on file  . Number of children: 2  . Years of education: Not on file  . Highest education level: Not on file  Occupational History  . Occupation: Buyer, retail  Tobacco Use  . Smoking status: Never Smoker  . Smokeless tobacco: Never Used  Vaping Use  . Vaping Use: Never used  Substance and Sexual Activity  . Alcohol use: Yes    Alcohol/week: 14.0 - 15.0 standard drinks    Types: 7 - 8 Glasses of wine, 7 Standard drinks or equivalent per week    Comment: occ  . Drug use: No  . Sexual activity: Yes  Other Topics Concern  . Not on file  Social History Narrative   Married 23. Son with downs syndrome and daughter is deaf.       Works for [employer] from home.       Hobbies: enjoys walking/exercise, painting, gardening.    Social Determinants of Health    Financial Resource Strain:   . Difficulty of Paying Living Expenses: Not on file  Food Insecurity:   . Worried About Charity fundraiser in the Last Year: Not on file  . Ran Out of Food in the Last Year: Not on file  Transportation Needs:   . Lack of Transportation (Medical): Not on file  . Lack of Transportation (Non-Medical): Not on file  Physical Activity:   . Days of Exercise per Week: Not on file  . Minutes of Exercise per Session: Not on file  Stress:   . Feeling of Stress : Not on file  Social Connections:   . Frequency of Communication with Friends and Family: Not on file  . Frequency of Social Gatherings with Friends and Family: Not on file  . Attends Religious Services: Not on file  . Active Member of Clubs or Organizations: Not on file  . Attends Archivist Meetings: Not on file  . Marital Status: Not on file   Family History  Problem Relation Age of Onset  . Breast cancer Mother        survived without recurrence  . Prostate cancer Father        around age 26 -surgeyr  . Dementia Father  died from this at age 63  . Healthy Sister   . Healthy Brother   . Hemochromatosis Son        none in patient  . Down syndrome Son   . Colon cancer Neg Hx   . Esophageal cancer Neg Hx   . Rectal cancer Neg Hx   . Stomach cancer Neg Hx   . Pancreatic cancer Neg Hx    Allergies  Allergen Reactions  . Codeine     REACTION: nausea vomiting   Prior to Admission medications   Medication Sig Start Date End Date Taking? Authorizing Provider  albuterol (VENTOLIN HFA) 108 (90 Base) MCG/ACT inhaler Inhale 2 puffs into the lungs every 6 (six) hours as needed for wheezing or shortness of breath. 09/25/19   Mannam, Hart Robinsons, MD  Ascorbic Acid (VITAMIN C) 1000 MG tablet Take 1,000 mg by mouth daily.    [provider]  citalopram (CELEXA) 40 MG tablet Take 1 tablet (40 mg total) by mouth daily. 04/15/20   [name], MD  fluticasone furoate-vilanterol  (BREO ELLIPTA) 200-25 MCG/INH AEPB USE 1 INHALATION ORALLY    DAILY 04/06/20   Mannam, Hart Robinsons, MD  Multiple Vitamin (MULTIVITAMIN WITH MINERALS) TABS tablet Take 1 tablet by mouth daily.    [provider]  nystatin cream (MYCOSTATIN) Apply topically 3 (three) times daily. 08/06/19   Swayze, Ava, DO  simvastatin (ZOCOR) 20 MG tablet Take 1 tablet (20 mg total) by mouth at bedtime. 04/15/20   [name], MD  Tiotropium Bromide Monohydrate (SPIRIVA RESPIMAT) 2.5 MCG/ACT AERS USE 2 INHALATIONS ORALLY   DAILY 09/25/19   Mannam, Hart Robinsons, MD  XHANCE 93 MCG/ACT EXHU BLOW 2 DOSES IN EACH NOSTRIL TWICE DAILY Patient not taking: No sig reported 02/06/19   Garnet Sierras, DO     Positive ROS: Otherwise negative  All other systems have been reviewed and were otherwise negative with the exception of those mentioned in the HPI and as above.  Physical Exam: Constitutional: Alert, well-appearing, no acute distress Ears: External ears without lesions or tenderness. Ear canals are clear bilaterally.  Right TM is clear.  Left TM with a serous otitis media with bubbles.  No signs of infection. Nasal: External nose without lesions. Septum with septal spur to the left..  Nasal endoscopy was performed and on nasal endoscopy patient has small polyps within both middle meatus regions.  The nasopharynx was clear.  The left eustachian tube was not obstructed. Oral: Lips and gums without lesions. Tongue and palate mucosa without lesions. Posterior oropharynx clear. Neck: No palpable adenopathy or masses Respiratory: Breathing comfortably  Skin: No facial/neck lesions or rash noted.  Nasal/sinus endoscopy  Date/Time: 05/06/2020 4:43 PM Performed by: Rozetta Nunnery, MD Authorized by: Rozetta Nunnery, MD   Consent:    Consent obtained:  Verbal   Consent given by:  Patient Procedure details:    Indications: sino-nasal symptoms     Medication:  Afrin   Instrument: flexible fiberoptic nasal  endoscope   Septum:    Deviation: deviated to the left     Spurs: left   Sinus:    Right middle meatus: normal     polyps     Left middle meatus: normal     polyps     Right nasopharynx: normal     Left nasopharynx: normal     Left Eustachian tube orifices: normal   Comments:     On nasal endoscopy patient has bilateral sinonasal polyps which  are not obstructing the nasal cavity.    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  Briefly discussed the option of placing a myringotomy tube in the left ear but symptoms are not that bad yet. Would recommend use of XHANCE as this will be more effective than the Flonase for sinonasal polyps. Also prescribed prednisone 20 mg tablets 3 tablets for 3 days, 2 tablets for 4 days, and 1 tablet for 4 days. He will follow-up as needed.   Radene Journey, MD

## 2020-05-10 ENCOUNTER — Ambulatory Visit: Payer: Medicare Other | Attending: Internal Medicine

## 2020-05-10 DIAGNOSIS — Z23 Encounter for immunization: Secondary | ICD-10-CM

## 2020-05-10 NOTE — Progress Notes (Signed)
   Covid-19 Vaccination Clinic  Name:  Edward Lee    MRN: [id number] DOB: 1956-07-03  05/10/2020  Edward Lee was observed post Covid-19 immunization for 15 minutes without incident. He was provided with Vaccine Information Sheet and instruction to access the V-Safe system.   Edward Lee was instructed to call 911 with any severe reactions post vaccine: Marland Kitchen Difficulty breathing  . Swelling of face and throat  . A fast heartbeat  . A bad rash all over body  . Dizziness and weakness

## 2020-05-17 ENCOUNTER — Ambulatory Visit: Payer: 59

## 2020-05-24 ENCOUNTER — Encounter: Payer: Self-pay | Admitting: Family Medicine

## 2020-05-24 ENCOUNTER — Other Ambulatory Visit: Payer: Self-pay

## 2020-05-24 ENCOUNTER — Ambulatory Visit (INDEPENDENT_AMBULATORY_CARE_PROVIDER_SITE_OTHER): Payer: Medicare Other | Admitting: Family Medicine

## 2020-05-24 VITALS — BP 119/77 | HR 60 | Temp 98.3°F | Ht 74.0 in | Wt 190.2 lb

## 2020-05-24 DIAGNOSIS — E785 Hyperlipidemia, unspecified: Secondary | ICD-10-CM

## 2020-05-24 DIAGNOSIS — I2510 Atherosclerotic heart disease of native coronary artery without angina pectoris: Secondary | ICD-10-CM

## 2020-05-24 DIAGNOSIS — Z0000 Encounter for general adult medical examination without abnormal findings: Secondary | ICD-10-CM | POA: Diagnosis not present

## 2020-05-24 DIAGNOSIS — I2584 Coronary atherosclerosis due to calcified coronary lesion: Secondary | ICD-10-CM

## 2020-05-24 DIAGNOSIS — C61 Malignant neoplasm of prostate: Secondary | ICD-10-CM

## 2020-05-24 DIAGNOSIS — J4550 Severe persistent asthma, uncomplicated: Secondary | ICD-10-CM

## 2020-05-24 DIAGNOSIS — I700 Atherosclerosis of aorta: Secondary | ICD-10-CM

## 2020-05-24 DIAGNOSIS — Z23 Encounter for immunization: Secondary | ICD-10-CM | POA: Diagnosis not present

## 2020-05-24 MED ORDER — PREDNISONE 20 MG PO TABS
ORAL_TABLET | ORAL | 0 refills | Status: DC
Start: 2020-05-24 — End: 2020-06-20

## 2020-05-24 NOTE — Progress Notes (Signed)
Phone: [phone]   Subjective:  Patient presents today for their annual physical. Chief complaint-noted.   See problem oriented charting- Review of Systems  Constitutional: Negative for chills and fever.  HENT: Positive for congestion. Negative for ear discharge and hearing loss.   Eyes: Negative for blurred vision and double vision.  Respiratory: Negative for cough and shortness of breath.   Cardiovascular: Negative for chest pain and palpitations.  Gastrointestinal: Negative for constipation, diarrhea, nausea and vomiting.  Genitourinary: Negative for dysuria and frequency.  Musculoskeletal: Negative for myalgias and neck pain.  Skin: Negative for itching and rash.  Neurological: Negative for dizziness and headaches.  Endo/Heme/Allergies: Negative for polydipsia. Does not bruise/bleed easily.  Psychiatric/Behavioral: Negative for depression and suicidal ideas.   The following were reviewed and entered/updated in epic: Past Medical History:  Diagnosis Date  . Allergy   . Chronic lower back pain   . Herniated disc    L1  . Hypercholesteremia   . IBS (irritable bowel syndrome)    ? possible  . Prostate cancer [facility])    Patient Active Problem List   Diagnosis Date Noted  . Multifocal pneumonia 08/05/2019    Priority: High  . Severe persistent asthma without complication 20/25/4270    Priority: High  . Malignant neoplasm of prostate ([place]) 04/26/2015    Priority: High  . Aortic atherosclerosis ([place]) 08/10/2019    Priority: Medium  . Coronary artery calcification 08/10/2019    Priority: Medium  . Tinea cruris 08/05/2019    Priority: Medium  . Chronic low back pain 01/10/2017    Priority: Medium  . Urinary frequency 11/28/2011    Priority: Medium  . Hyperlipidemia 03/21/2011    Priority: Medium  . PTSD (post-traumatic stress disorder) 09/06/2010    Priority: Medium  . Recurrent maxillary sinusitis 11/17/2018    Priority: Low  . Other allergic rhinitis  11/17/2018    Priority: Low  . IBS (irritable bowel syndrome)     Priority: Low   Past Surgical History:  Procedure Laterality Date  . CATARACT EXTRACTION    . COLONOSCOPY    . PILONIDAL CYST EXCISION  64 yrs old  . PROSTATE BIOPSY  02/11/15    Family History  Problem Relation Age of Onset  . Breast cancer Mother        survived without recurrence  . Prostate cancer Father        around age 20 -surgeyr  . Dementia Father        died from this at age 58  . Healthy Sister   . Healthy Brother   . Hemochromatosis Son        none in patient  . Down syndrome Son   . Colon cancer Neg Hx   . Esophageal cancer Neg Hx   . Rectal cancer Neg Hx   . Stomach cancer Neg Hx   . Pancreatic cancer Neg Hx     Medications- reviewed and updated Current Outpatient Medications  Medication Sig Dispense Refill  . albuterol (VENTOLIN HFA) 108 (90 Base) MCG/ACT inhaler Inhale 2 puffs into the lungs every 6 (six) hours as needed for wheezing or shortness of breath. 54 g 3  . Ascorbic Acid (VITAMIN C) 1000 MG tablet Take 1,000 mg by mouth daily.    . citalopram (CELEXA) 40 MG tablet Take 1 tablet (40 mg total) by mouth daily. 90 tablet 0  . fluticasone furoate-vilanterol (BREO ELLIPTA) 200-25 MCG/INH AEPB USE 1 INHALATION ORALLY    DAILY 180 each  3  . Multiple Vitamin (MULTIVITAMIN WITH MINERALS) TABS tablet Take 1 tablet by mouth daily.    . simvastatin (ZOCOR) 20 MG tablet Take 1 tablet (20 mg total) by mouth at bedtime. 90 tablet 0  . predniSONE (DELTASONE) 20 MG tablet Take 2 pills for 3 days, 1 pill for 4 days 10 tablet 0  . XHANCE 93 MCG/ACT EXHU BLOW 2 DOSES IN EACH NOSTRIL TWICE DAILY (Patient not taking: No sig reported) 32 mL 12   No current facility-administered medications for this visit.    Allergies-reviewed and updated Allergies  Allergen Reactions  . Codeine     REACTION: nausea vomiting    Social History   Social History Narrative   Married 74. Son with downs syndrome  and daughter is deaf.       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       Hobbies: enjoys walking/exercise, painting, gardening.    Objective  Objective:  BP 119/77   Pulse 60   Temp 98.3 F (36.8 C) (Temporal)   Ht 6\' 2"  (1.88 m)   Wt 190 lb 3.2 oz (86.3 kg)   SpO2 98%   BMI 24.42 kg/m  Gen: NAD, resting comfortably HEENT: Mucous membranes are moist. Oropharynx normal other than mild pharyngeal signs of irritation/drainage Neck: no thyromegaly or cervical lymphadenopaty CV: RRR no murmurs rubs or gallops Lungs: CTAB no crackles, wheeze, rhonchi Abdomen: soft/nontender/nondistended/normal bowel sounds. No rebound or guarding.  Ext: no edema Skin: warm, dry Neuro: grossly normal, moves all extremities, PERRLA   Assessment and Plan  64 y.o. male presenting for annual physical.  Health Maintenance counseling: 1. Anticipatory guidance: Patient counseled regarding regular dental exams -q6 months, eye exams -yearly,  avoiding smoking and second hand smoke , limiting alcohol to 2 beverages per day - 2 glasses of wine daily.   2. Risk factor reduction:  Advised patient of need for regular exercise and diet rich and fruits and vegetables to reduce risk of heart attack and stroke. Exercise- walking 5 miles a day plus f3 once a week closing time. Diet- trying to eat a healthy diet/limit sugar/keep carbs down- cut down on bread and milk and helps. Has been able to regain post pneumonia.  Wt Readings from Last 3 Encounters:  05/24/20 190 lb 3.2 oz (86.3 kg)  09/25/19 183 lb 9.6 oz (83.3 kg)  08/10/19 183 lb 6.4 oz (83.2 kg)  3. Immunizations/screenings/ancillary studies-discussed Prevnar 75 today with history of pneumonia- opts in, discussed COVID-19 vaccination- up to date, discussed Shingrix- opts out for now (also had shingles 1988 bad case), declines flu shot despite history of respiratory issues Immunization History  Administered Date(s) Administered  . PFIZER  SARS-COV-2 Vaccination 08/20/2019, 09/10/2019, 05/10/2020  . Pneumococcal Polysaccharide-23 12/04/2018  . Td 01/14/1997, 01/20/2008  . Tdap 06/17/2018   4. Prostate cancer - patient under active surveillance for prostate cancer.  Continue to follow closely with [facility]. Last biopsy reassuring and PSA has gone down.  Lab Results  Component Value Date   PSA 0.55 09/07/2019   PSA 1.52 03/05/2019   PSA 0.68 07/14/2018   5. Colon cancer screening - 05/09/2016 with 10-year repeat planned 6. Skin cancer screening- no recent visits- has seen Dr. Renda Rolls in the past. advised regular sunscreen use. Denies worrisome, changing, or new skin lesions.  7.  Never smoker 8. STD screening - opts out as monogamous  Status of chronic or acute concerns   #Asthma-patient compliant with Breo and  Spiriva (now off) through pulmonology.  Typically using albuterol twice a month.    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 Saw Dr. Lucia Gaskins. He did well with prednisone for a week and did well and then stopped and symptoms worsened again. Also on Xhance through Dr. Lucia Gaskins.  -patient with symptoms worsening would like to have prednisone on hand in case further worsens- I think this is reasonable and sent in 7 day course.   #PTSD symptoms- doing well on celexa 40mg  will continue this.   #Hyperlipidemia-patient compliant with simvastatin 20 mg.  We will update lipid panel today with LDL goal under 70 considering he has aortic atherosclerosis and coronary artery calcium noted on prior CT Lab Results  Component Value Date   CHOL 174 01/10/2017   HDL 71.00 01/10/2017   LDLCALC 92 01/10/2017   TRIG 76 08/05/2019   CHOLHDL 2 01/10/2017    Recommended follow up: Return in about 1 year (around 05/24/2021) for physical or sooner if needed.  Lab/Order associations:not fasting   ICD-10-CM   1. Preventative health care  Z00.00   2. Severe persistent asthma without complication  G99.24   3. Malignant neoplasm of prostate  ([place])  C61   4. Aortic atherosclerosis ([facility])  I70.0 CBC With Differential/Platelet    COMPLETE METABOLIC PANEL WITH GFR    Lipid Panel w/reflex Direct LDL  5. Coronary artery calcification  I25.10    I25.84   6. Hyperlipidemia, unspecified hyperlipidemia type  E78.5 CBC With Differential/Platelet    COMPLETE METABOLIC PANEL WITH GFR    Lipid Panel w/reflex Direct LDL   Meds ordered this encounter  Medications  . predniSONE (DELTASONE) 20 MG tablet    Sig: Take 2 pills for 3 days, 1 pill for 4 days    Dispense:  10 tablet    Refill:  0   Return precautions advised.  [name], MD

## 2020-05-24 NOTE — Patient Instructions (Addendum)
Health Maintenance Due  Topic Date Due   PNA vac Low Risk Adult (1 of 2 - PCV13)  Prevnar 13 today 12/04/2019   Please check with your pharmacy to see if they have the shingrix vaccine. If they do- please get this immunization and update [place] by phone call or mychart with dates you receive the vaccine  Please stop by lab before you go If you have mychart- we will send your results within 3 business days of [place] receiving them.  If you do not have mychart- we will call you about results within 5 business days of [place] receiving them.  *please note we are currently using [facility] which has a longer processing time than  typically so labs may not come back as quickly as in the past *please also note that you will see labs on mychart as soon as they post. I will later go in and write notes on them- will say "notes from Dr. Yong Channel"    Recommended follow up: No follow-ups on file.

## 2020-05-24 NOTE — Addendum Note (Signed)
Addended by: Jerrel Ivory D on: 05/24/2020 02:07 PM   Modules accepted: Orders

## 2020-05-25 LAB — LIPID PANEL W/REFLEX DIRECT LDL
Cholesterol: 182 mg/dL (ref <200)
HDL: 89 mg/dL (ref >=40)
LDL Cholesterol (Calc): 78 mg/dL (calc)
Non-HDL Cholesterol (Calc): 93 mg/dL (calc) (ref <130)
Total CHOL/HDL Ratio: 2 (calc) (ref <5.0)
Triglycerides: 71 mg/dL (ref <150)

## 2020-05-25 LAB — COMPLETE METABOLIC PANEL WITH GFR
AG Ratio: 1.7 (calc) (ref 1.0–2.5)
ALT: 18 U/L (ref 9–46)
AST: 25 U/L (ref 10–35)
Albumin: 4.3 g/dL (ref 3.6–5.1)
Alkaline phosphatase (APISO): 39 U/L (ref 35–144)
BUN: 18 mg/dL (ref 7–25)
CO2: 28 mmol/L (ref 20–32)
Calcium: 9.9 mg/dL (ref 8.6–10.3)
Chloride: 102 mmol/L (ref 98–110)
Creat: 1.11 mg/dL (ref 0.70–1.25)
GFR, Est African American: 80 mL/min/{1.73_m2} (ref >=60)
GFR, Est Non African American: 69 mL/min/{1.73_m2} (ref >=60)
Globulin: 2.6 g/dL (calc) (ref 1.9–3.7)
Glucose, Bld: 90 mg/dL (ref 65–99)
Potassium: 5.6 mmol/L — ABNORMAL HIGH (ref 3.5–5.3)
Sodium: 139 mmol/L (ref 135–146)
Total Bilirubin: 0.5 mg/dL (ref 0.2–1.2)
Total Protein: 6.9 g/dL (ref 6.1–8.1)

## 2020-05-25 LAB — CBC WITH DIFFERENTIAL/PLATELET
Absolute Monocytes: 690 cells/uL (ref 200–950)
Basophils Absolute: 37 cells/uL (ref 0–200)
Basophils Relative: 0.4 %
Eosinophils Absolute: 524 cells/uL — ABNORMAL HIGH (ref 15–500)
Eosinophils Relative: 5.7 %
HCT: 43.3 % (ref 38.5–50.0)
Hemoglobin: 14.3 g/dL (ref 13.2–17.1)
Lymphs Abs: 1619 cells/uL (ref 850–3900)
MCH: 30.5 pg (ref 27.0–33.0)
MCHC: 33 g/dL (ref 32.0–36.0)
MCV: 92.3 fL (ref 80.0–100.0)
MPV: 10.9 fL (ref 7.5–12.5)
Monocytes Relative: 7.5 %
Neutro Abs: 6330 cells/uL (ref 1500–7800)
Neutrophils Relative %: 68.8 %
Platelets: 216 10*3/uL (ref 140–400)
RBC: 4.69 10*6/uL (ref 4.20–5.80)
RDW: 12.2 % (ref 11.0–15.0)
Total Lymphocyte: 17.6 %
WBC: 9.2 10*3/uL (ref 3.8–10.8)

## 2020-05-26 ENCOUNTER — Telehealth: Payer: Self-pay

## 2020-05-26 ENCOUNTER — Other Ambulatory Visit: Payer: Self-pay

## 2020-05-26 DIAGNOSIS — E875 Hyperkalemia: Secondary | ICD-10-CM

## 2020-05-26 NOTE — Telephone Encounter (Signed)
Called and spoke with pt, labs reviewed.

## 2020-05-26 NOTE — Telephone Encounter (Signed)
Patient is returning Keba's call about lab results.

## 2020-05-30 ENCOUNTER — Other Ambulatory Visit: Payer: Self-pay

## 2020-05-31 ENCOUNTER — Other Ambulatory Visit: Payer: Medicare Other

## 2020-05-31 ENCOUNTER — Other Ambulatory Visit: Payer: Self-pay

## 2020-05-31 DIAGNOSIS — E875 Hyperkalemia: Secondary | ICD-10-CM | POA: Diagnosis not present

## 2020-06-01 LAB — BASIC METABOLIC PANEL
BUN: 21 mg/dL (ref 7–25)
CO2: 27 mmol/L (ref 20–32)
Calcium: 9.7 mg/dL (ref 8.6–10.3)
Chloride: 101 mmol/L (ref 98–110)
Creat: 1.19 mg/dL (ref 0.70–1.25)
Glucose, Bld: 79 mg/dL (ref 65–99)
Potassium: 5 mmol/L (ref 3.5–5.3)
Sodium: 137 mmol/L (ref 135–146)

## 2020-06-16 DIAGNOSIS — Z0120 Encounter for dental examination and cleaning without abnormal findings: Secondary | ICD-10-CM | POA: Diagnosis not present

## 2020-06-20 ENCOUNTER — Encounter: Payer: Self-pay | Admitting: Family Medicine

## 2020-06-20 MED ORDER — PREDNISONE 20 MG PO TABS: ORAL_TABLET | ORAL | 0 refills | Status: DC

## 2020-08-01 ENCOUNTER — Other Ambulatory Visit: Payer: Self-pay

## 2020-08-01 ENCOUNTER — Encounter: Payer: Self-pay | Admitting: Family Medicine

## 2020-08-01 MED ORDER — CITALOPRAM HYDROBROMIDE 40 MG PO TABS
40.0000 mg | ORAL_TABLET | Freq: Every day | ORAL | 0 refills | Status: DC
Start: 2020-08-01 — End: 2020-10-25

## 2020-08-01 MED ORDER — SIMVASTATIN 20 MG PO TABS
20.0000 mg | ORAL_TABLET | Freq: Every day | ORAL | 0 refills | Status: DC
Start: 2020-08-01 — End: 2020-10-25

## 2020-08-15 ENCOUNTER — Telehealth: Payer: Self-pay

## 2020-08-15 NOTE — Telephone Encounter (Signed)
See below, ok to schedule in office?

## 2020-08-15 NOTE — Progress Notes (Signed)
Phone [phone] In person visit   Subjective:   Edward Lee is a 65 y.o. year old very pleasant male patient who presents for/with See problem oriented charting Chief Complaint  Patient presents with  . Sinus Problem    Sinus drainage that goes into asthma, cough    This visit occurred during the SARS-CoV-2 public health emergency.  Safety protocols were in place, including screening questions prior to the visit, additional usage of staff PPE, and extensive cleaning of exam room while observing appropriate contact time as indicated for disinfecting solutions.   Past Medical History-  Patient Active Problem List   Diagnosis Date Noted  . Multifocal pneumonia 08/05/2019    Priority: High  . Severe persistent asthma without complication 19/62/2297    Priority: High  . Malignant neoplasm of prostate ([place]) 04/26/2015    Priority: High  . Anosmia 08/16/2020    Priority: Medium  . Aortic atherosclerosis ([place]) 08/10/2019    Priority: Medium  . Coronary artery calcification 08/10/2019    Priority: Medium  . Tinea cruris 08/05/2019    Priority: Medium  . Chronic low back pain 01/10/2017    Priority: Medium  . Urinary frequency 11/28/2011    Priority: Medium  . Hyperlipidemia 03/21/2011    Priority: Medium  . PTSD (post-traumatic stress disorder) 09/06/2010    Priority: Medium  . Recurrent maxillary sinusitis 11/17/2018    Priority: Low  . Other allergic rhinitis 11/17/2018    Priority: Low  . IBS (irritable bowel syndrome)     Priority: Low    Medications- reviewed and updated Current Outpatient Medications  Medication Sig Dispense Refill  . albuterol (VENTOLIN HFA) 108 (90 Base) MCG/ACT inhaler Inhale 2 puffs into the lungs every 6 (six) hours as needed for wheezing or shortness of breath. 54 g 3  . amoxicillin-clavulanate (AUGMENTIN) 875-125 MG tablet Take 1 tablet by mouth 2 (two) times daily for 7 days. 14 tablet 0  . Ascorbic Acid (VITAMIN C) 1000 MG tablet Take  1,000 mg by mouth daily.    . citalopram (CELEXA) 40 MG tablet Take 1 tablet (40 mg total) by mouth daily. 90 tablet 0  . fluticasone furoate-vilanterol (BREO ELLIPTA) 200-25 MCG/INH AEPB USE 1 INHALATION ORALLY    DAILY 180 each 3  . Multiple Vitamin (MULTIVITAMIN WITH MINERALS) TABS tablet Take 1 tablet by mouth daily.    . simvastatin (ZOCOR) 20 MG tablet Take 1 tablet (20 mg total) by mouth at bedtime. 90 tablet 0  . XHANCE 93 MCG/ACT EXHU BLOW 2 DOSES IN EACH NOSTRIL TWICE DAILY 32 mL 12   No current facility-administered medications for this visit.     Objective:  BP 130/82   Pulse 74   Temp 98 F (36.7 C) (Temporal)   Ht 6\' 2"  (1.88 m)   Wt 185 lb (83.9 kg) Comment: Per patient  SpO2 97%   BMI 23.75 kg/m  Gen: NAD, resting comfortably Nasal turbinate erythema with lemon curd yellow discharge noted, air fluid level in left ear (history otitis media with effusion) CV: RRR no murmurs rubs or gallops Lungs: CTAB no crackles, wheeze, rhonchi Abdomen: soft/nontender/nondistended/normal bowel sounds. No rebound or guarding.  Ext: no edema Skin: warm, dry     Assessment and Plan  #Sinus congestion and chest congestion in patient with asthma S:started right after Christmas. Really cold weather tough on him in recent weeks. More wheezing, drainage, congestion- noted sinuses and in chest.  Some mild shortness of breath with stairs. Equal in  chest and sinuses.   Patient with severe persistent asthma generally controlled with breo. Also has albuterol on hand - may go a week or so without but then may need 3x a day for a few days - worse in AM.   No antibiotics in this time frame or prednisone. Last year significant pneumonia leading to hypoxia and hospitalization A/P: 65 year old male with  Severe persistent asthma and history of significant pneumonia leading to hospitalization last year with history of sinusitis now with a month of sinus congestion also agitating his asthma. He would  like to minimize meds if possible so for bacterial sinusitis we opted to start with augmentin 7 day course. If no improvement within a week and if wheezing is a primary symptom consider prednisone. If sinuses clear but lung congestion does not discussed possible course of doxycycline for respiratory coverage of bacterial illness (potential atypical pneumonia for instance) - we also discussed current covid protocols and hope that this will shift to endemic within coming months -asthma generally well controlled with recent exacerbation- since sinus drainage seems to be trigger we opted to try treating this first.   # Anosmia  S:patient reported loss of taste and smell going back to spring of 2021. Was seen by Dr. Lucia Gaskins. Was also having recurrent left serous otitis media- patient was placed on xhance but later changed back to flonase. Had already been tested for allergies in past per his note. Patient gets significant improvement in symptoms on steroids but then symptoms return within a few days of being off of medicine   Patient had also seen Dr. Ernesto Rutherford prior to his retirement but sinuses largely clear.  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 Patient initially asks about clinical trials and I told him that I honestly am not aware of any but ENT would likely be more aware- he is interested in 2nd opinion we discussed potentially Dr. Redmond Baseman or Dr. Benjamine Mola but patient would like to think over these options.   - with initial complaint of cough we tested for covid but we discussed he is outside of quarantine period (over a month of symptoms with no significant recent worsening just failure to improve)  Recommended follow up: as needed for acute concerns  Lab/Order associations:   ICD-10-CM   1. Bacterial sinusitis  J32.9    B96.89   2. Cough  R05.9 Novel Coronavirus, NAA ([facility])  3. Severe persistent asthma without complication  F68.12   4. Anosmia  R43.0      Time Spent: 36 minutes of total time (3:54 PM- 4:20 PM, 5 :35-5:45 PM) was spent on the date of the encounter performing the following actions: chart review prior to seeing the patient, obtaining history, performing a medically necessary exam, counseling on the treatment plan, placing orders, and documenting in our EHR.   Return precautions advised.  [name], MD

## 2020-08-15 NOTE — Patient Instructions (Addendum)
Try augmentin for sinusitis. Hoping this helps with drip which seems to be irritating asthma.   If no better in a week - particularly if wheezing main symptom could try prednisone.   If sinuses clear but lung congestion does not- could also try antibiotic like doxycycline as next step for better lung coverage (but also helps sinuses)  Consider referral to [facility]. Redmond Baseman (with [facility] but in [place]) or Dr. Benjamine Mola (independent)  Recommended follow up: as needed for acute concerns

## 2020-08-15 NOTE — Telephone Encounter (Signed)
Patient is calling in requesting an appointment in office for tomorrow to discuss a cough and drainage from sinus issues. Edward Lee explained Dr.Hunter requested last year that if he were having those symptoms for him to schedule an appointment before it turns into pneumonia.

## 2020-08-15 NOTE — Telephone Encounter (Signed)
Called patient and he states that it is not COVID, he does not feel like it is necessary to be tested. Did advise patient to call before entering to which Ingram said he does not feel like it is appropriate to do so, because we need to "move past" this COVID concern. Walid states he will not call before entering, will not like to be brought in through side door that he will show up at 3:40 PM tomorrow and walk in the front doors.

## 2020-08-15 NOTE — Telephone Encounter (Signed)
Im ok seeing in office if last patient of day (if covid test has not been done yet). See if CPE which is last patient of day can be shifted 20 mins earlier and put him in last slot. Need to bring in side door and use n95 if no PCR test for covid has been done

## 2020-08-16 ENCOUNTER — Other Ambulatory Visit: Payer: Self-pay

## 2020-08-16 ENCOUNTER — Encounter: Payer: Self-pay | Admitting: Family Medicine

## 2020-08-16 ENCOUNTER — Ambulatory Visit (INDEPENDENT_AMBULATORY_CARE_PROVIDER_SITE_OTHER): Payer: Medicare Other | Admitting: Family Medicine

## 2020-08-16 VITALS — BP 130/82 | HR 74 | Temp 98.0°F | Ht 74.0 in | Wt 185.0 lb

## 2020-08-16 DIAGNOSIS — J329 Chronic sinusitis, unspecified: Secondary | ICD-10-CM | POA: Diagnosis not present

## 2020-08-16 DIAGNOSIS — J4550 Severe persistent asthma, uncomplicated: Secondary | ICD-10-CM

## 2020-08-16 DIAGNOSIS — R059 Cough, unspecified: Secondary | ICD-10-CM | POA: Diagnosis not present

## 2020-08-16 DIAGNOSIS — R430 Anosmia: Secondary | ICD-10-CM | POA: Diagnosis not present

## 2020-08-16 DIAGNOSIS — B9689 Other specified bacterial agents as the cause of diseases classified elsewhere: Secondary | ICD-10-CM

## 2020-08-16 MED ORDER — AMOXICILLIN-POT CLAVULANATE 875-125 MG PO TABS: 1.0000 | ORAL_TABLET | Freq: Two times a day (BID) | ORAL | 0 refills | Status: AC

## 2020-08-30 ENCOUNTER — Other Ambulatory Visit: Payer: Self-pay

## 2020-08-30 ENCOUNTER — Encounter: Payer: Self-pay | Admitting: Family Medicine

## 2020-08-30 DIAGNOSIS — B9689 Other specified bacterial agents as the cause of diseases classified elsewhere: Secondary | ICD-10-CM

## 2020-09-06 ENCOUNTER — Encounter: Payer: Self-pay | Admitting: Family Medicine

## 2020-09-26 DIAGNOSIS — J329 Chronic sinusitis, unspecified: Secondary | ICD-10-CM | POA: Diagnosis not present

## 2020-09-28 DIAGNOSIS — J3489 Other specified disorders of nose and nasal sinuses: Secondary | ICD-10-CM | POA: Diagnosis not present

## 2020-09-28 DIAGNOSIS — J321 Chronic frontal sinusitis: Secondary | ICD-10-CM | POA: Diagnosis not present

## 2020-09-28 DIAGNOSIS — J329 Chronic sinusitis, unspecified: Secondary | ICD-10-CM | POA: Diagnosis not present

## 2020-09-28 DIAGNOSIS — J320 Chronic maxillary sinusitis: Secondary | ICD-10-CM | POA: Diagnosis not present

## 2020-09-28 DIAGNOSIS — J323 Chronic sphenoidal sinusitis: Secondary | ICD-10-CM | POA: Diagnosis not present

## 2020-10-13 DIAGNOSIS — Z01818 Encounter for other preprocedural examination: Secondary | ICD-10-CM | POA: Diagnosis not present

## 2020-10-17 DIAGNOSIS — J338 Other polyp of sinus: Secondary | ICD-10-CM | POA: Diagnosis not present

## 2020-10-17 DIAGNOSIS — J324 Chronic pansinusitis: Secondary | ICD-10-CM | POA: Diagnosis not present

## 2020-10-17 DIAGNOSIS — J329 Chronic sinusitis, unspecified: Secondary | ICD-10-CM | POA: Diagnosis not present

## 2020-10-25 ENCOUNTER — Encounter: Payer: Self-pay | Admitting: Family Medicine

## 2020-10-25 ENCOUNTER — Other Ambulatory Visit: Payer: Self-pay

## 2020-10-25 MED ORDER — SIMVASTATIN 20 MG PO TABS
20.0000 mg | ORAL_TABLET | Freq: Every day | ORAL | 0 refills | Status: DC
Start: 2020-10-25 — End: 2021-02-01

## 2020-10-25 MED ORDER — CITALOPRAM HYDROBROMIDE 40 MG PO TABS
40.0000 mg | ORAL_TABLET | Freq: Every day | ORAL | 0 refills | Status: DC
Start: 2020-10-25 — End: 2021-02-01

## 2020-11-01 DIAGNOSIS — J324 Chronic pansinusitis: Secondary | ICD-10-CM | POA: Diagnosis not present

## 2020-11-03 DIAGNOSIS — C61 Malignant neoplasm of prostate: Secondary | ICD-10-CM | POA: Diagnosis not present

## 2020-11-15 DIAGNOSIS — J322 Chronic ethmoidal sinusitis: Secondary | ICD-10-CM | POA: Diagnosis not present

## 2021-01-04 DIAGNOSIS — J0101 Acute recurrent maxillary sinusitis: Secondary | ICD-10-CM | POA: Diagnosis not present

## 2021-01-04 DIAGNOSIS — J45909 Unspecified asthma, uncomplicated: Secondary | ICD-10-CM | POA: Diagnosis not present

## 2021-01-04 DIAGNOSIS — J324 Chronic pansinusitis: Secondary | ICD-10-CM | POA: Diagnosis not present

## 2021-01-17 DIAGNOSIS — H524 Presbyopia: Secondary | ICD-10-CM | POA: Diagnosis not present

## 2021-02-01 ENCOUNTER — Other Ambulatory Visit: Payer: Self-pay

## 2021-02-01 ENCOUNTER — Encounter: Payer: Self-pay | Admitting: Family Medicine

## 2021-02-01 MED ORDER — SIMVASTATIN 20 MG PO TABS: 20.0000 mg | ORAL_TABLET | Freq: Every day | ORAL | 2 refills | Status: DC

## 2021-02-01 MED ORDER — CITALOPRAM HYDROBROMIDE 40 MG PO TABS: 40.0000 mg | ORAL_TABLET | Freq: Every day | ORAL | 2 refills | Status: DC

## 2021-02-06 DIAGNOSIS — R0982 Postnasal drip: Secondary | ICD-10-CM | POA: Diagnosis not present

## 2021-02-06 DIAGNOSIS — Z8709 Personal history of other diseases of the respiratory system: Secondary | ICD-10-CM | POA: Diagnosis not present

## 2021-02-06 DIAGNOSIS — J45909 Unspecified asthma, uncomplicated: Secondary | ICD-10-CM | POA: Diagnosis not present

## 2021-02-06 DIAGNOSIS — J343 Hypertrophy of nasal turbinates: Secondary | ICD-10-CM | POA: Diagnosis not present

## 2021-02-08 ENCOUNTER — Encounter: Payer: Self-pay | Admitting: Pulmonary Disease

## 2021-02-08 ENCOUNTER — Other Ambulatory Visit: Payer: Self-pay

## 2021-02-08 ENCOUNTER — Ambulatory Visit: Payer: Medicare Other | Admitting: Pulmonary Disease

## 2021-02-08 VITALS — BP 116/76 | HR 79 | Ht 74.0 in | Wt 190.2 lb

## 2021-02-08 DIAGNOSIS — J4550 Severe persistent asthma, uncomplicated: Secondary | ICD-10-CM

## 2021-02-08 MED ORDER — PREDNISONE 10 MG PO TABS: ORAL_TABLET | ORAL | 0 refills | Status: DC

## 2021-02-08 NOTE — Progress Notes (Signed)
ADITH GLENDENNING    OO:[id number]    1955-09-24  Primary Care Physician:Hunter, Brayton Mars, MD  Referring Physician: [name], MD [place],  [place] [address]  Chief complaint:  Follow up for asthma  HPI: 65 year old with history of allergies, hyperlipidemia, irritable bowel syndrome, prostate cancer.  Complains of increasing dyspnea since September 2018 after return from travel to [place].  Symptoms associated with chest congestion, wheezing mostly at night, nonproductive cough.  Denies any sputum production, fevers, chills.  He was treated with several rounds of prednisone which improves symptoms temporarily.  Started on Flovent in December 2018 by Dr. Yong Channel, primary care.   Referred from recurrent exacerbations throughout 2019 Inhalers changed to Breo and Spiriva. He has history of seasonal allergies, denies acid reflux.  There is a strong history of asthma in the family.   Has hospitalized for community-acquired pneumonia at the end of January 2021. He tested negative for COVID 19 and was treated with azithromycin, Augmentin with improvement in symptoms Follow-up chest x-ray showed improvement in pulmonary infiltrates   Pets: Dogs, no cats, birds Occupation: Works for [employer].  Works from home Exposures: No known exposures.  No mold at home.  Hardwood, carpet at home.  Forced air heating Smoking history: Never smoker  Travel History: Traveled to [place], [place] recently.  No other significant travel.  Interim History: Here for acute exacerbation  At last visit in March 2021 he was taken off Spiriva as he had stable symptoms.  Continues on Symbicort Had been doing very well until 2 weeks ago when he developed sinus infection, postnasal drip which is treated with antibiotics and steroid nasal spray by ENT.  The symptoms have resolved but asthma has flared up with worsening dyspnea, wheezing, chest congestion.  Denies any fevers,  chills   Outpatient Encounter Medications as of 02/08/2021  Medication Sig   albuterol (VENTOLIN HFA) 108 (90 Base) MCG/ACT inhaler Inhale 2 puffs into the lungs every 6 (six) hours as needed for wheezing or shortness of breath.   Ascorbic Acid (VITAMIN C) 1000 MG tablet Take 1,000 mg by mouth daily.   citalopram (CELEXA) 40 MG tablet Take 1 tablet (40 mg total) by mouth daily.   fluticasone furoate-vilanterol (BREO ELLIPTA) 200-25 MCG/INH AEPB USE 1 INHALATION ORALLY    DAILY   Multiple Vitamin (MULTIVITAMIN WITH MINERALS) TABS tablet Take 1 tablet by mouth daily.   simvastatin (ZOCOR) 20 MG tablet Take 1 tablet (20 mg total) by mouth at bedtime.   XHANCE 93 MCG/ACT EXHU BLOW 2 DOSES IN EACH NOSTRIL TWICE DAILY   No facility-administered encounter medications on file as of 02/08/2021.   Physical Exam: Blood pressure 116/76, pulse 79, height '6\' 2"'$  (1.88 m), weight 190 lb 3.2 oz (86.3 kg), SpO2 95 %. Gen:      No acute distress HEENT:  EOMI, sclera anicteric Neck:     No masses; no thyromegaly Lungs:    Bilateral expiratory wheeze CV:         Regular rate and rhythm; no murmurs Abd:      + bowel sounds; soft, non-tender; no palpable masses, no distension Ext:    No edema; adequate peripheral perfusion Skin:      Warm and dry; no rash Neuro: alert and oriented x 3 Psych: normal mood and affect   Data Reviewed: Imaging: Chest x-ray 08/04/2019-bilateral groundglass opacities, consolidation. CTA 08/05/2019-no pulmonary embolism, extensive upper lobe predominant bilateral  consolidation groundglass with air bronchograms.  Mediastinal lymphadenopathy Chest x-ray 09/22/19-provement in lung opacities with streaky residual opacities in the upper lobe. I have reviewed the images personally.  PFTs: 08/27/17 FVC 6.18 [117%], FEV1 5.09 [128%], F/F 82, TLC 112%, DLCO 94% Normal study.  FENO  07/25/17- 54 08/27/2017-85 11/22/2017-99 11/28/2017-157 02/27/2018-55  ACQ 7 02/12/2019- 0.43  ACT score   09/25/2019-23 02/08/2021-11  Labs: CBC 12/14/14-WBC count 6.6, absolute eosinophil count 400  CBC 07/25/17-WBC count 9.1, absolute eosinophil count 500 Blood allergy profile 07/25/17-IgE 58, RAST panel is negative.  Assessment:  Severe persistent asthma Symptoms are consistent with asthma with elevated FENO and CBCs showing eosinophilia. PFTs reviewed which do not show any obstruction.  There is improvement in mid flow rates post albuterol indicating small airways disease  Continues on Breo.  Spiriva was stopped in 2021  Here with acute exacerbation due to sinusitis, postnasal drip He has resumed the Spiriva.  We will treat him with a Pred taper since he is wheezing in office today  I suspect once he is back to baseline he can go back to just using the Breo inhaler as his asthma is being previously very well controlled up to now   Plan/Recommendations: - Continue Breo, Spiriva - Prednisone taper starting at 60 mg.  Reduce by 10 mg every 2 days  Follow-up in 3 months [name] MD [facility] 02/08/2021, 11:10 AM  CC: [name], MD

## 2021-02-08 NOTE — Addendum Note (Signed)
Addended by: Elton Sin on: 02/08/2021 11:32 AM   Modules accepted: Orders

## 2021-02-08 NOTE — Patient Instructions (Addendum)
Prednisone taper starting at 60 mg.  Reduce by 10 mg every 2 days Continue the inhalers Follow-up in 6 months

## 2021-02-10 ENCOUNTER — Ambulatory Visit: Payer: Medicare Other | Admitting: Pulmonary Disease

## 2021-02-21 ENCOUNTER — Other Ambulatory Visit: Payer: Self-pay | Admitting: Urology

## 2021-02-21 DIAGNOSIS — C61 Malignant neoplasm of prostate: Secondary | ICD-10-CM

## 2021-03-16 DIAGNOSIS — Z8709 Personal history of other diseases of the respiratory system: Secondary | ICD-10-CM | POA: Diagnosis not present

## 2021-03-16 DIAGNOSIS — R0989 Other specified symptoms and signs involving the circulatory and respiratory systems: Secondary | ICD-10-CM | POA: Diagnosis not present

## 2021-03-16 DIAGNOSIS — H73893 Other specified disorders of tympanic membrane, bilateral: Secondary | ICD-10-CM | POA: Diagnosis not present

## 2021-03-21 ENCOUNTER — Ambulatory Visit
Admission: RE | Admit: 2021-03-21 | Discharge: 2021-03-21 | Disposition: A | Discharge status: Home / Self Care | Payer: Medicare Other | Source: Ambulatory Visit | Attending: Urology | Admitting: Urology

## 2021-03-21 DIAGNOSIS — J4550 Severe persistent asthma, uncomplicated: Secondary | ICD-10-CM

## 2021-03-21 DIAGNOSIS — R590 Localized enlarged lymph nodes: Secondary | ICD-10-CM | POA: Diagnosis not present

## 2021-03-21 DIAGNOSIS — C61 Malignant neoplasm of prostate: Secondary | ICD-10-CM

## 2021-03-21 MED ORDER — GADOBENATE DIMEGLUMINE 529 MG/ML IV SOLN
18.0000 mL | Freq: Once | INTRAVENOUS | Status: AC | PRN
  Administered 2021-03-21: 18 mL via INTRAVENOUS

## 2021-03-21 NOTE — Telephone Encounter (Signed)
Received the following message from patient:   "Hi Dr. Vaughan Browner,   My asthma has recently more difficult the past couple of nights. I suspect it's the change in the weather and also due to my current sinus problem. I have two questions:   1.  Rescue inhaler is not effective. I have a prednisone prescription left from a previous visit. Taking the prescribed daily dosage has helped greatly. (It's been two days). Should I continue?   2. Is it time for a nebulizer?  If so, I assume will need a prescription.     Thanks,    Edward Lee"   Dr. Vaughan Browner, can you please advise? Thanks!

## 2021-03-24 DIAGNOSIS — J3489 Other specified disorders of nose and nasal sinuses: Secondary | ICD-10-CM | POA: Diagnosis not present

## 2021-03-24 DIAGNOSIS — J329 Chronic sinusitis, unspecified: Secondary | ICD-10-CM | POA: Diagnosis not present

## 2021-03-28 MED ORDER — IPRATROPIUM-ALBUTEROL 0.5-2.5 (3) MG/3ML IN SOLN: 3.0000 mL | Freq: Three times a day (TID) | RESPIRATORY_TRACT | 6 refills | Status: DC | PRN

## 2021-03-28 MED ORDER — MONTELUKAST SODIUM 10 MG PO TABS: 10.0000 mg | ORAL_TABLET | Freq: Every day | ORAL | 11 refills | Status: DC

## 2021-03-28 NOTE — Telephone Encounter (Signed)
I called and discussed with patient He is working with the ENT to help with postnasal drip and has CT scan of the sinuses and possible surgery after  He had a recent exacerbation exacerbated by postnasal drip.  Currently controlled on Pred taper He continues on Breo  Please call in a nebulizer machine and duo nebs 3 times a day as needed Please call in Singulair Make follow-up appointment with me in 3 months  I have already discussed this plan with patient and he does not need to call back.

## 2021-04-05 DIAGNOSIS — J45998 Other asthma: Secondary | ICD-10-CM | POA: Diagnosis not present

## 2021-04-14 ENCOUNTER — Encounter: Payer: Self-pay | Admitting: Family Medicine

## 2021-04-17 ENCOUNTER — Emergency Department (HOSPITAL_COMMUNITY): Payer: Medicare Other

## 2021-04-17 ENCOUNTER — Encounter: Payer: Self-pay | Admitting: Family Medicine

## 2021-04-17 ENCOUNTER — Inpatient Hospital Stay (HOSPITAL_COMMUNITY)
Admission: EM | Admit: 2021-04-17 | Discharge: 2021-04-18 | DRG: 202 | Disposition: A | Discharge status: Home / Self Care | Payer: Medicare Other | Attending: Family Medicine | Admitting: Family Medicine

## 2021-04-17 DIAGNOSIS — Z8042 Family history of malignant neoplasm of prostate: Secondary | ICD-10-CM

## 2021-04-17 DIAGNOSIS — I10 Essential (primary) hypertension: Secondary | ICD-10-CM | POA: Diagnosis not present

## 2021-04-17 DIAGNOSIS — Z803 Family history of malignant neoplasm of breast: Secondary | ICD-10-CM | POA: Diagnosis not present

## 2021-04-17 DIAGNOSIS — J984 Other disorders of lung: Secondary | ICD-10-CM | POA: Diagnosis not present

## 2021-04-17 DIAGNOSIS — Z20822 Contact with and (suspected) exposure to covid-19: Secondary | ICD-10-CM | POA: Diagnosis present

## 2021-04-17 DIAGNOSIS — Z79899 Other long term (current) drug therapy: Secondary | ICD-10-CM

## 2021-04-17 DIAGNOSIS — F419 Anxiety disorder, unspecified: Secondary | ICD-10-CM | POA: Diagnosis not present

## 2021-04-17 DIAGNOSIS — J4551 Severe persistent asthma with (acute) exacerbation: Secondary | ICD-10-CM | POA: Diagnosis not present

## 2021-04-17 DIAGNOSIS — E7800 Pure hypercholesterolemia, unspecified: Secondary | ICD-10-CM | POA: Diagnosis not present

## 2021-04-17 DIAGNOSIS — Z9849 Cataract extraction status, unspecified eye: Secondary | ICD-10-CM

## 2021-04-17 DIAGNOSIS — R000 Tachycardia, unspecified: Secondary | ICD-10-CM | POA: Diagnosis not present

## 2021-04-17 DIAGNOSIS — Z8279 Family history of other congenital malformations, deformations and chromosomal abnormalities: Secondary | ICD-10-CM

## 2021-04-17 DIAGNOSIS — Z8546 Personal history of malignant neoplasm of prostate: Secondary | ICD-10-CM | POA: Diagnosis not present

## 2021-04-17 DIAGNOSIS — J9601 Acute respiratory failure with hypoxia: Secondary | ICD-10-CM | POA: Diagnosis present

## 2021-04-17 DIAGNOSIS — E785 Hyperlipidemia, unspecified: Secondary | ICD-10-CM | POA: Diagnosis not present

## 2021-04-17 DIAGNOSIS — R0600 Dyspnea, unspecified: Secondary | ICD-10-CM | POA: Diagnosis not present

## 2021-04-17 DIAGNOSIS — Z885 Allergy status to narcotic agent status: Secondary | ICD-10-CM

## 2021-04-17 DIAGNOSIS — Z7952 Long term (current) use of systemic steroids: Secondary | ICD-10-CM

## 2021-04-17 DIAGNOSIS — J9600 Acute respiratory failure, unspecified whether with hypoxia or hypercapnia: Secondary | ICD-10-CM | POA: Diagnosis not present

## 2021-04-17 DIAGNOSIS — J45901 Unspecified asthma with (acute) exacerbation: Secondary | ICD-10-CM | POA: Diagnosis present

## 2021-04-17 DIAGNOSIS — I700 Atherosclerosis of aorta: Secondary | ICD-10-CM | POA: Diagnosis not present

## 2021-04-17 LAB — I-STAT CHEM 8, ED
BUN: 29 mg/dL — ABNORMAL HIGH (ref 8–23)
Calcium, Ion: 1.14 mmol/L — ABNORMAL LOW (ref 1.15–1.40)
Chloride: 107 mmol/L (ref 98–111)
Creatinine, Ser: 1.2 mg/dL (ref 0.61–1.24)
Glucose, Bld: 130 mg/dL — ABNORMAL HIGH (ref 70–99)
HCT: 46 % (ref 39.0–52.0)
Hemoglobin: 15.6 g/dL (ref 13.0–17.0)
Potassium: 4.8 mmol/L (ref 3.5–5.1)
Sodium: 138 mmol/L (ref 135–145)
TCO2: 29 mmol/L (ref 22–32)

## 2021-04-17 LAB — CBC WITH DIFFERENTIAL/PLATELET
Abs Immature Granulocytes: 0.04 10*3/uL (ref 0.00–0.07)
Basophils Absolute: 0 10*3/uL (ref 0.0–0.1)
Basophils Relative: 0 %
Eosinophils Absolute: 1.6 10*3/uL — ABNORMAL HIGH (ref 0.0–0.5)
Eosinophils Relative: 14 %
HCT: 40.1 % (ref 39.0–52.0)
Hemoglobin: 13.1 g/dL (ref 13.0–17.0)
Immature Granulocytes: 0 %
Lymphocytes Relative: 23 %
Lymphs Abs: 2.6 10*3/uL (ref 0.7–4.0)
MCH: 31.1 pg (ref 26.0–34.0)
MCHC: 32.7 g/dL (ref 30.0–36.0)
MCV: 95.2 fL (ref 80.0–100.0)
Monocytes Absolute: 0.8 10*3/uL (ref 0.1–1.0)
Monocytes Relative: 7 %
Neutro Abs: 6.3 10*3/uL (ref 1.7–7.7)
Neutrophils Relative %: 56 %
Platelets: 191 10*3/uL (ref 150–400)
RBC: 4.21 MIL/uL — ABNORMAL LOW (ref 4.22–5.81)
RDW: 14.1 % (ref 11.5–15.5)
WBC: 11.4 10*3/uL — ABNORMAL HIGH (ref 4.0–10.5)
nRBC: 0 % (ref 0.0–0.2)

## 2021-04-17 LAB — RESPIRATORY PANEL BY PCR

## 2021-04-17 LAB — HIV ANTIBODY (ROUTINE TESTING W REFLEX): HIV Screen 4th Generation wRfx: NONREACTIVE

## 2021-04-17 LAB — BLOOD GAS, ARTERIAL
Acid-base deficit: 3 mmol/L — ABNORMAL HIGH (ref 0.0–2.0)
Bicarbonate: 22.6 mmol/L (ref 20.0–28.0)
Drawn by: 11249
FIO2: 30
Mode: POSITIVE
O2 Saturation: 98.8 %
Patient temperature: 97.7
pCO2 arterial: 43.7 mmHg (ref 32.0–48.0)
pH, Arterial: 7.331 — ABNORMAL LOW (ref 7.350–7.450)
pO2, Arterial: 143 mmHg — ABNORMAL HIGH (ref 83.0–108.0)

## 2021-04-17 LAB — COMPREHENSIVE METABOLIC PANEL
ALT: 24 U/L (ref 0–44)
AST: 39 U/L (ref 15–41)
Albumin: 4.3 g/dL (ref 3.5–5.0)
Alkaline Phosphatase: 40 U/L (ref 38–126)
Anion gap: 8 (ref 5–15)
BUN: 22 mg/dL (ref 8–23)
CO2: 26 mmol/L (ref 22–32)
Calcium: 8.9 mg/dL (ref 8.9–10.3)
Chloride: 102 mmol/L (ref 98–111)
Creatinine, Ser: 1.26 mg/dL — ABNORMAL HIGH (ref 0.61–1.24)
GFR, Estimated: >60 mL/min (ref >60)
Glucose, Bld: 126 mg/dL — ABNORMAL HIGH (ref 70–99)
Potassium: 4.3 mmol/L (ref 3.5–5.1)
Sodium: 136 mmol/L (ref 135–145)
Total Bilirubin: 0.7 mg/dL (ref 0.3–1.2)
Total Protein: 8 g/dL (ref 6.5–8.1)

## 2021-04-17 LAB — RESP PANEL BY RT-PCR (FLU A&B, COVID) ARPGX2
Influenza A by PCR: NEGATIVE
Influenza B by PCR: NEGATIVE
SARS Coronavirus 2 by RT PCR: NEGATIVE

## 2021-04-17 LAB — LACTIC ACID, PLASMA: Lactic Acid, Venous: 1.2 mmol/L (ref 0.5–1.9)

## 2021-04-17 LAB — BRAIN NATRIURETIC PEPTIDE: B Natriuretic Peptide: 46 pg/mL (ref 0.0–100.0)

## 2021-04-17 LAB — TROPONIN I (HIGH SENSITIVITY): Troponin I (High Sensitivity): 4 ng/L (ref <18)

## 2021-04-17 MED ORDER — IPRATROPIUM BROMIDE 0.02 % IN SOLN
0.5000 mg | Freq: Once | RESPIRATORY_TRACT | Status: AC
  Administered 2021-04-17: 0.5 mg via RESPIRATORY_TRACT
  Filled 2021-04-17: qty 2.5

## 2021-04-17 MED ORDER — ALBUTEROL (5 MG/ML) CONTINUOUS INHALATION SOLN
10.0000 mg/h | INHALATION_SOLUTION | Freq: Once | RESPIRATORY_TRACT | Status: AC
  Administered 2021-04-17: 10 mg/h via RESPIRATORY_TRACT

## 2021-04-17 MED ORDER — SODIUM CHLORIDE 0.9 % IV BOLUS
500.0000 mL | Freq: Once | INTRAVENOUS | Status: AC
  Administered 2021-04-17: 500 mL via INTRAVENOUS

## 2021-04-17 MED ORDER — CHLORHEXIDINE GLUCONATE 0.12 % MT SOLN
15.0000 mL | Freq: Two times a day (BID) | OROMUCOSAL | Status: DC
  Administered 2021-04-18: 15 mL via OROMUCOSAL

## 2021-04-17 MED ORDER — ORAL CARE MOUTH RINSE: 15.0000 mL | Freq: Two times a day (BID) | OROMUCOSAL | Status: DC

## 2021-04-17 MED ORDER — CHLORHEXIDINE GLUCONATE CLOTH 2 % EX PADS: 6.0000 | MEDICATED_PAD | Freq: Every day | CUTANEOUS | Status: DC

## 2021-04-17 MED ORDER — METHYLPREDNISOLONE SODIUM SUCC 125 MG IJ SOLR
125.0000 mg | Freq: Once | INTRAMUSCULAR | Status: AC
  Administered 2021-04-17: 125 mg via INTRAVENOUS
  Filled 2021-04-17: qty 2

## 2021-04-17 MED ORDER — PREDNISONE 20 MG PO TABS: 40.0000 mg | ORAL_TABLET | Freq: Every day | ORAL | Status: DC

## 2021-04-17 MED ORDER — METHYLPREDNISOLONE SODIUM SUCC 125 MG IJ SOLR
60.0000 mg | Freq: Two times a day (BID) | INTRAMUSCULAR | Status: AC
  Administered 2021-04-17 – 2021-04-18 (×2): 60 mg via INTRAVENOUS
  Filled 2021-04-17 (×2): qty 2

## 2021-04-17 MED ORDER — IPRATROPIUM-ALBUTEROL 0.5-2.5 (3) MG/3ML IN SOLN
3.0000 mL | Freq: Four times a day (QID) | RESPIRATORY_TRACT | Status: DC
  Administered 2021-04-17 – 2021-04-18 (×4): 3 mL via RESPIRATORY_TRACT
  Filled 2021-04-17 (×4): qty 3

## 2021-04-17 MED ORDER — ACETAMINOPHEN 325 MG PO TABS
650.0000 mg | ORAL_TABLET | Freq: Four times a day (QID) | ORAL | Status: DC | PRN
  Administered 2021-04-17 (×2): 650 mg via ORAL
  Filled 2021-04-17 (×2): qty 2

## 2021-04-17 MED ORDER — ENOXAPARIN SODIUM 40 MG/0.4ML IJ SOSY
40.0000 mg | PREFILLED_SYRINGE | INTRAMUSCULAR | Status: DC
  Administered 2021-04-18: 40 mg via SUBCUTANEOUS
  Filled 2021-04-17: qty 0.4

## 2021-04-17 MED ORDER — ACETAMINOPHEN 650 MG RE SUPP: 650.0000 mg | Freq: Four times a day (QID) | RECTAL | Status: DC | PRN

## 2021-04-17 NOTE — ED Notes (Signed)
EMS called and said pt also had 125 Solu-medrol and they forgot to state this in report.
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
## 2021-04-17 NOTE — ED Notes (Signed)
Pt. Given a pillow per request

## 2021-04-17 NOTE — Progress Notes (Signed)
Pt placed back on BIPAP for SOB after walking to bathroom
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
## 2021-04-17 NOTE — ED Notes (Signed)
Urine and culture sent down to lab

## 2021-04-17 NOTE — H&P (Signed)
History and Physical    Edward Lee:[id number] DOB: [birth year] DOA: 04/17/2021  PCP: [name], MD  Patient coming from: Home  Chief Complaint: shortness of breath  HPI: Edward Lee is a 65 y.o. male with medical history significant of asthma, HLD, IBS. Presenting with dyspnea. History is limited as patient is on BiPap and it is difficult for him to speak. He reports that his symptoms began 3 days ago. He tried his regular meds and rescue inhalers; but they did not help. This morning his symptoms were unbearable, so he came to the ED.     ED Course: He was given steroids and nebs. He was placed on BiPap. [facility] was called for admission.   Review of Systems: Review of systems is otherwise negative for all not mentioned in HPI.   PMHx Past Medical History:  Diagnosis Date   Allergy    Chronic lower back pain    Herniated disc    L1   Hypercholesteremia    IBS (irritable bowel syndrome)    ? possible   Prostate cancer ([place])     PSHx Past Surgical History:  Procedure Laterality Date   CATARACT EXTRACTION     COLONOSCOPY     PILONIDAL CYST EXCISION  65 yrs old   PROSTATE BIOPSY  02/11/15    SocHx  reports that he has never smoked. He has never used smokeless tobacco. He reports current alcohol use of about 14.0 - 15.0 standard drinks per week. He reports that he does not use drugs.  Allergies  Allergen Reactions   Codeine     REACTION: nausea vomiting    FamHx Family History  Problem Relation Age of Onset   Breast cancer Mother        survived without recurrence   Prostate cancer Father        around age 57 -surgeyr   Dementia Father        died from this at age 23   Healthy Sister    Healthy Brother    Hemochromatosis Son        none in patient   Down syndrome Son    Colon cancer Neg Hx    Esophageal cancer Neg Hx    Rectal cancer Neg Hx    Stomach cancer Neg Hx    Pancreatic cancer Neg Hx     Prior to Admission medications   Medication  Sig Start Date End Date Taking? Authorizing Provider  albuterol (VENTOLIN HFA) 108 (90 Base) MCG/ACT inhaler Inhale 2 puffs into the lungs every 6 (six) hours as needed for wheezing or shortness of breath. 09/25/19   Mannam, Hart Robinsons, MD  Ascorbic Acid (VITAMIN C) 1000 MG tablet Take 1,000 mg by mouth daily.    [provider]  citalopram (CELEXA) 40 MG tablet Take 1 tablet (40 mg total) by mouth daily. 02/01/21   [name], MD  fluticasone furoate-vilanterol (BREO ELLIPTA) 200-25 MCG/INH AEPB USE 1 INHALATION ORALLY    DAILY 04/06/20   Mannam, Praveen, MD  ipratropium-albuterol (DUONEB) 0.5-2.5 (3) MG/3ML SOLN Take 3 mLs by nebulization every 8 (eight) hours as needed. 03/28/21   Mannam, Hart Robinsons, MD  montelukast (SINGULAIR) 10 MG tablet Take 1 tablet (10 mg total) by mouth at bedtime. 03/28/21   [name], MD  Multiple Vitamin (MULTIVITAMIN WITH MINERALS) TABS tablet Take 1 tablet by mouth daily.    [provider]  predniSONE (DELTASONE) 10 MG tablet Take 60mg  x's 2 days,50mg  x's  2 days,40mg x's 2 days, 30mg  dailyx 2 days,20mg x days,10mg x 2 days 02/08/21   [name], MD  simvastatin (ZOCOR) 20 MG tablet Take 1 tablet (20 mg total) by mouth at bedtime. 02/01/21   [name], MD  Edward Lee 93 MCG/ACT EXHU BLOW 2 DOSES IN [facility] NOSTRIL TWICE DAILY 02/06/19   Garnet Sierras, DO    Physical Exam: Vitals:   04/17/21 0600 04/17/21 0620 04/17/21 0630 04/17/21 0700  BP: (!) 150/106 (!) 156/94 (!) 154/91 (!) 139/95  Pulse: (!) 114 (!) 110 (!) 105 (!) 106  Resp: 20 (!) 23 (!) 21 (!) 22  Temp:      TempSrc:      SpO2: 100% 100% 100% 100%    General: 65 y.o. male resting in bed in NAD Eyes: PERRL, normal sclera ENMT: Nares patent w/o discharge, orophaynx clear, dentition normal, ears w/o discharge/lesions/ulcers Neck: Supple, trachea midline Cardiovascular: tachy +S1, S2, no m/g/r, equal pulses throughout Respiratory: diffuse insp/exp wheeze, increased WOB on BiPap GI:  BS+, NDNT, no masses noted, no organomegaly noted MSK: No e/c/c Skin: No rashes, bruises, ulcerations noted Neuro: A&O x 3, no focal deficits Psyc: Appropriate interaction and affect, calm/cooperative  Labs on Admission: I have personally reviewed following labs and imaging studies  CBC: Recent Labs  Lab 04/17/21 0447 04/17/21 0459  WBC 11.4*  --   NEUTROABS 6.3  --   HGB 13.1 15.6  HCT 40.1 46.0  MCV 95.2  --   PLT 191  --    Basic Metabolic Panel: Recent Labs  Lab 04/17/21 0447 04/17/21 0459  NA 136 138  K 4.3 4.8  CL 102 107  CO2 26  --   GLUCOSE 126* 130*  BUN 22 29*  CREATININE 1.26* 1.20  CALCIUM 8.9  --    GFR: CrCl cannot be calculated (Unknown ideal weight.). Liver Function Tests: Recent Labs  Lab 04/17/21 0447  AST 39  ALT 24  ALKPHOS 40  BILITOT 0.7  PROT 8.0  ALBUMIN 4.3   No results for input(s): LIPASE, AMYLASE in the last 168 hours. No results for input(s): AMMONIA in the last 168 hours. Coagulation Profile: No results for input(s): INR, PROTIME in the last 168 hours. Cardiac Enzymes: No results for input(s): CKTOTAL, CKMB, CKMBINDEX, TROPONINI in the last 168 hours. BNP (last 3 results) No results for input(s): PROBNP in the last 8760 hours. HbA1C: No results for input(s): HGBA1C in the last 72 hours. CBG: No results for input(s): GLUCAP in the last 168 hours. Lipid Profile: No results for input(s): CHOL, HDL, LDLCALC, TRIG, CHOLHDL, LDLDIRECT in the last 72 hours. Thyroid Function Tests: No results for input(s): TSH, T4TOTAL, FREET4, T3FREE, THYROIDAB in the last 72 hours. Anemia Panel: No results for input(s): VITAMINB12, FOLATE, FERRITIN, TIBC, IRON, RETICCTPCT in the last 72 hours. Urine analysis:    Component Value Date/Time   COLORURINE YELLOW 08/04/2019 2257   APPEARANCEUR CLEAR 08/04/2019 2257   LABSPEC 1.010 08/04/2019 2257   PHURINE 6.0 08/04/2019 2257   GLUCOSEU NEGATIVE 08/04/2019 2257   HGBUR NEGATIVE 08/04/2019  2257   HGBUR negative 03/02/2010 0848   BILIRUBINUR NEGATIVE 08/04/2019 2257   BILIRUBINUR Negative 12/14/2014 1054   KETONESUR NEGATIVE 08/04/2019 2257   PROTEINUR NEGATIVE 08/04/2019 2257   UROBILINOGEN 0.2 12/14/2014 1054   UROBILINOGEN 0.2 03/02/2010 0848   NITRITE NEGATIVE 08/04/2019 2257   LEUKOCYTESUR NEGATIVE 08/04/2019 2257    Radiological Exams on Admission: DG Chest Portable 1 View  Result Date: 04/17/2021 CLINICAL DATA:  65 year old male with history of dyspnea. EXAM: PORTABLE CHEST 1 VIEW COMPARISON:  Chest x-ray 09/22/2019. FINDINGS: Skin fold artifact projecting over the upper right hemithorax. Lung volumes are normal. Slight interstitial prominence throughout the upper lobes bilaterally, with elevation of the minor fissure. No consolidative airspace disease. No pleural effusions. No pneumothorax. No pulmonary nodule or mass noted. Pulmonary vasculature and the cardiomediastinal silhouette are within normal limits. Atherosclerosis in the thoracic aorta. IMPRESSION: 1. No radiographic evidence of acute cardiopulmonary disease. 2. Aortic atherosclerosis. 3. Mild chronic scarring in the upper lobes of the lungs bilaterally. Electronically Signed   By: Vinnie Langton M.D.   On: 04/17/2021 05:19    EKG: Independently reviewed. Sinus tach  Assessment/Plan Severe asthma exacerbation     - admit to inpt, SDU     - continue BiPap; wean as able     - steroids, nebs     - NPO for now     - COVID/flu negative; CXR w/o PNA     - if unable to wean from BiPap, will consult PCCM  HLD     - resume home regimen when taking PO  Anxiety     - resume home regimen when taking PO  DVT prophylaxis: lovenox  Code Status: FULL  Family Communication: None at bedside  Consults called: None   Status is: Inpatient  Remains inpatient appropriate because:Inpatient level of care appropriate due to severity of illness  Dispo: The patient is from: Home              Anticipated d/c is to:  Home              Patient currently is not medically stable to d/c.   Difficult to place patient No  Jonnie Finner DO [facility]  If 7PM-7AM, please contact night-coverage [email or web address]  04/17/2021, 7:19 AM

## 2021-04-18 LAB — CBC
HCT: 42.5 % (ref 39.0–52.0)
Hemoglobin: 14.5 g/dL (ref 13.0–17.0)
MCH: 30.9 pg (ref 26.0–34.0)
MCHC: 34.1 g/dL (ref 30.0–36.0)
MCV: 90.6 fL (ref 80.0–100.0)
Platelets: 207 10*3/uL (ref 150–400)
RBC: 4.69 MIL/uL (ref 4.22–5.81)
RDW: 14.1 % (ref 11.5–15.5)
WBC: 14.8 10*3/uL — ABNORMAL HIGH (ref 4.0–10.5)
nRBC: 0 % (ref 0.0–0.2)

## 2021-04-18 LAB — COMPREHENSIVE METABOLIC PANEL
ALT: 21 U/L (ref 0–44)
AST: 26 U/L (ref 15–41)
Albumin: 4 g/dL (ref 3.5–5.0)
Alkaline Phosphatase: 32 U/L — ABNORMAL LOW (ref 38–126)
Anion gap: 7 (ref 5–15)
BUN: 18 mg/dL (ref 8–23)
CO2: 25 mmol/L (ref 22–32)
Calcium: 9.3 mg/dL (ref 8.9–10.3)
Chloride: 103 mmol/L (ref 98–111)
Creatinine, Ser: 0.8 mg/dL (ref 0.61–1.24)
GFR, Estimated: >60 mL/min (ref >60)
Glucose, Bld: 158 mg/dL — ABNORMAL HIGH (ref 70–99)
Potassium: 4.7 mmol/L (ref 3.5–5.1)
Sodium: 135 mmol/L (ref 135–145)
Total Bilirubin: 0.9 mg/dL (ref 0.3–1.2)
Total Protein: 7.3 g/dL (ref 6.5–8.1)

## 2021-04-18 IMAGING — CT CT ANGIO CHEST
2 of 6 series · 18 of 36 positions shown · IV contrast (OMNIPAQUE 350)
Comparison: Radiograph yesterday.

CLINICAL DATA: Shortness of breath. Elevated D-dimer. Elevated
white blood cell count.

EXAM:
CT ANGIOGRAPHY CHEST WITH CONTRAST
TECHNIQUE: Multidetector CT imaging of the chest was performed using the
standard protocol during bolus administration of intravenous
contrast. Multiplanar CT image reconstructions and MIPs were
obtained to evaluate the vascular anatomy.
CONTRAST:  100mL OMNIPAQUE IOHEXOL 350 MG/ML SOLN

[Series 5: thins · axial · 0.73mm/px · z∈[+1504,+1772]mm · 17 of 303 slices shown]
[im 17/303  lung]
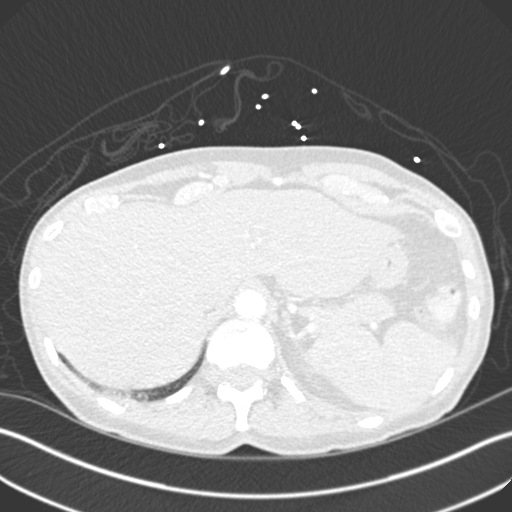
[im 34/303  mediastinal]
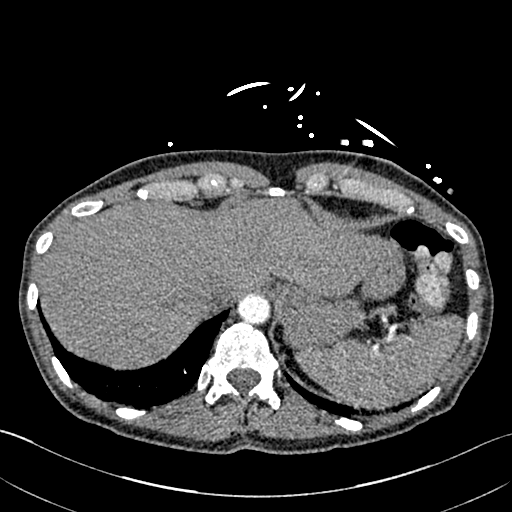
[im 51/303  lung]
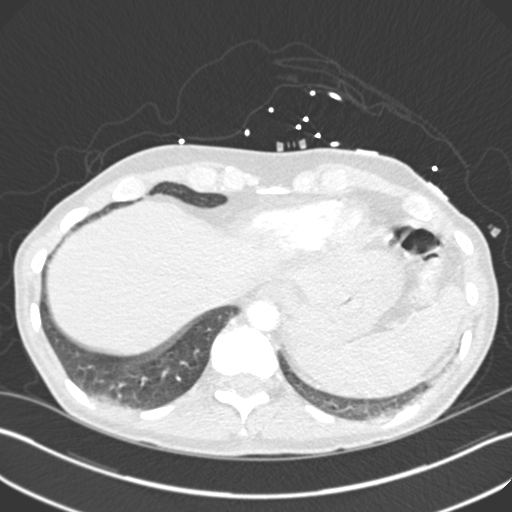
[im 68/303  mediastinal]
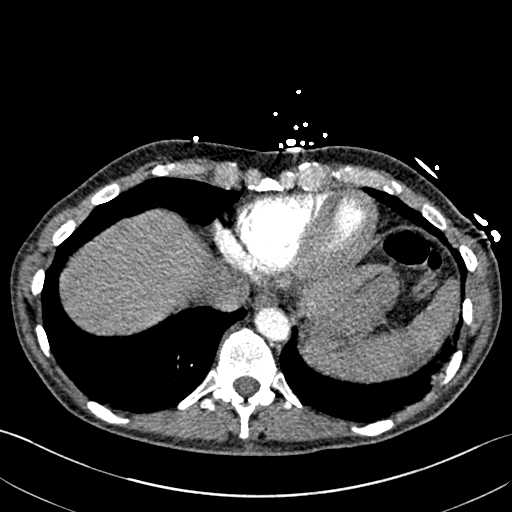
[im 84/303  lung]
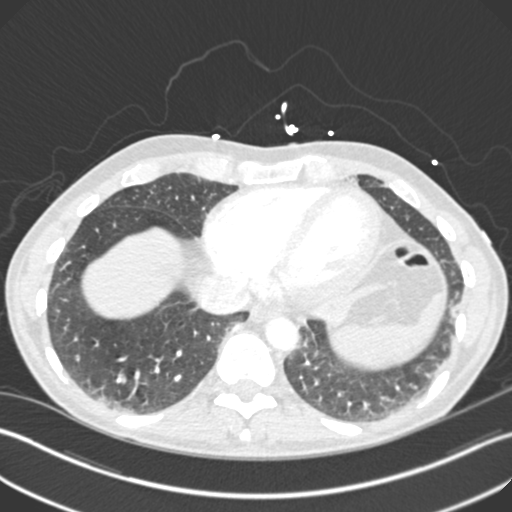
[im 101/303  mediastinal]
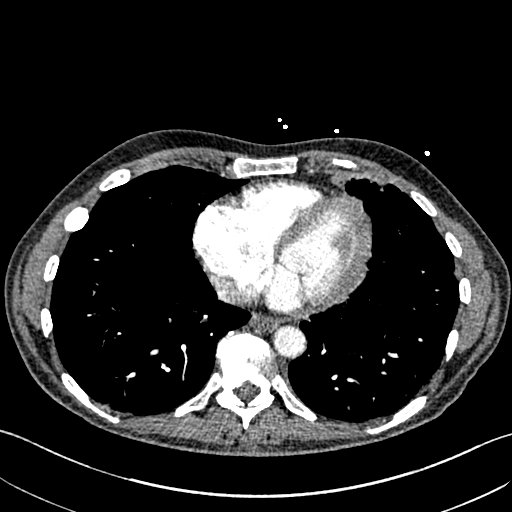
[im 118/303  lung]
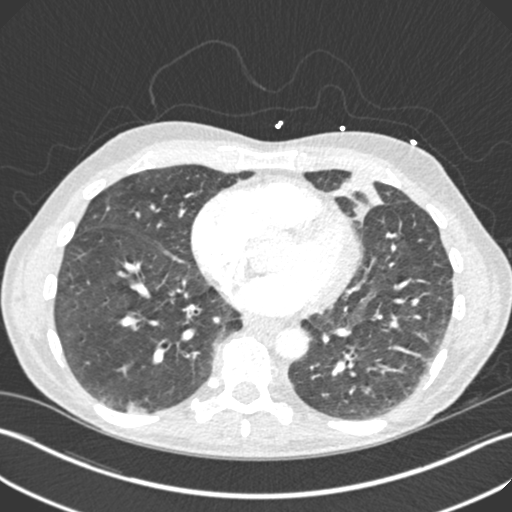
[im 135/303  mediastinal]
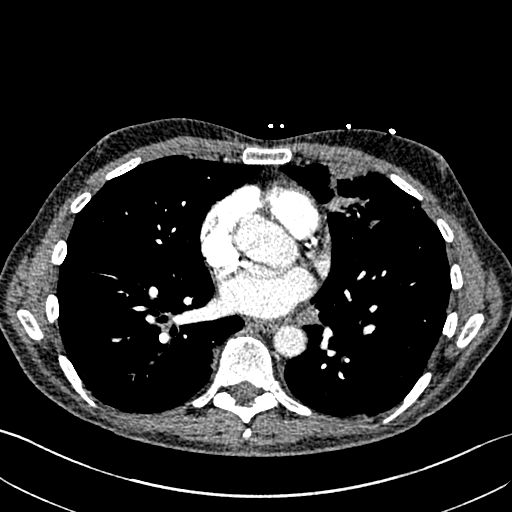
[im 152/303  lung]
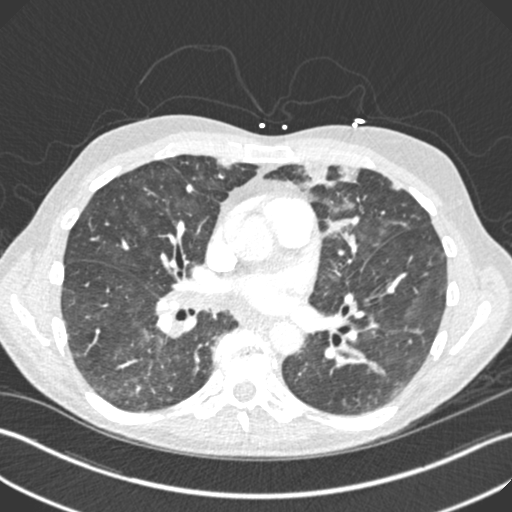
[im 168/303  mediastinal]
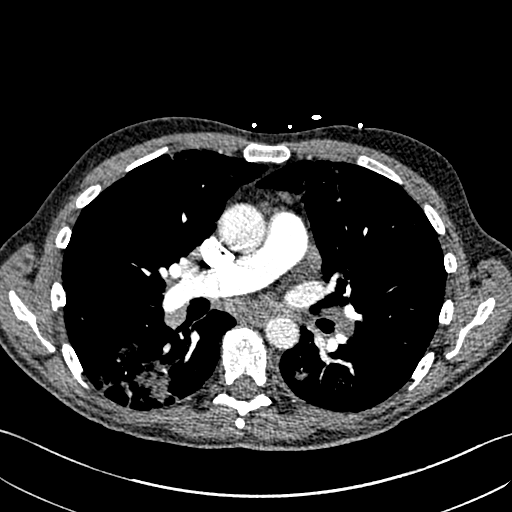
[im 185/303  lung]
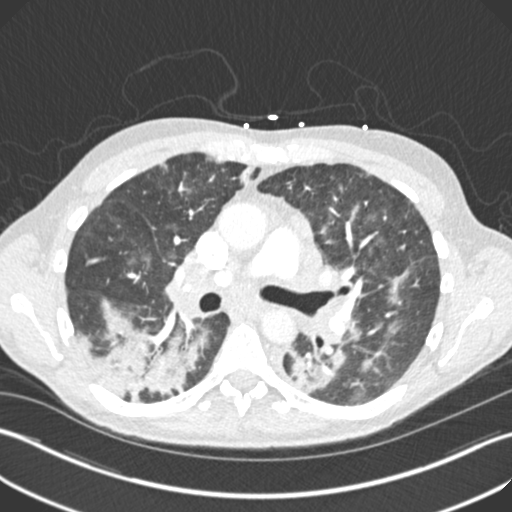
[im 202/303  mediastinal]
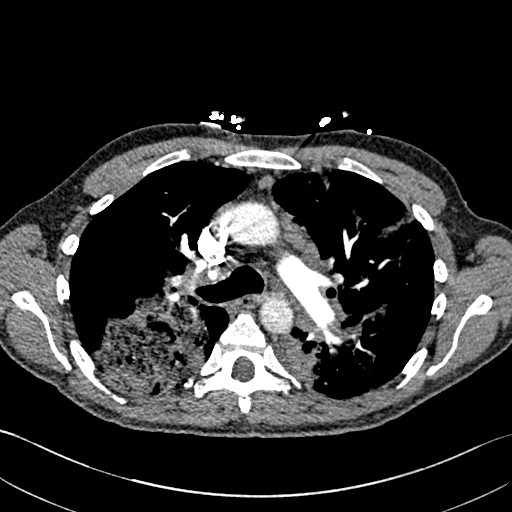
[im 219/303  lung]
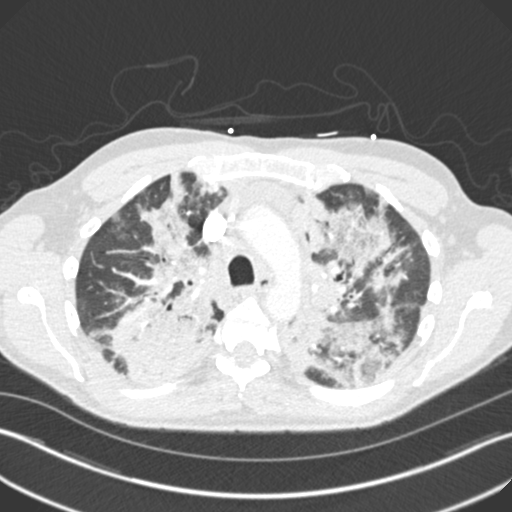
[im 235/303  mediastinal]
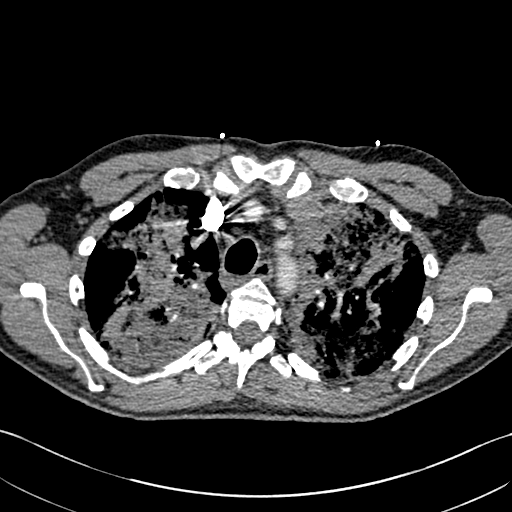
[im 252/303  lung]
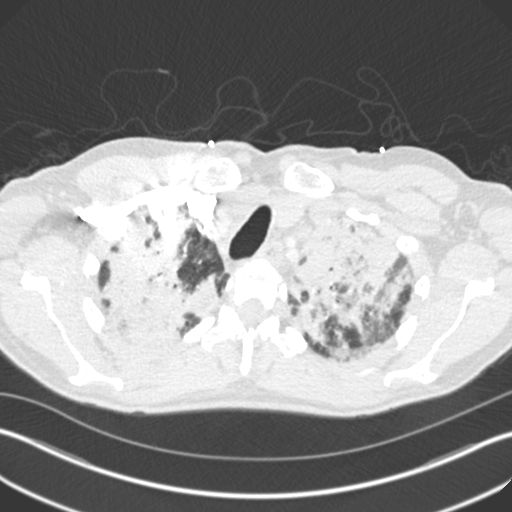
[im 269/303  mediastinal]
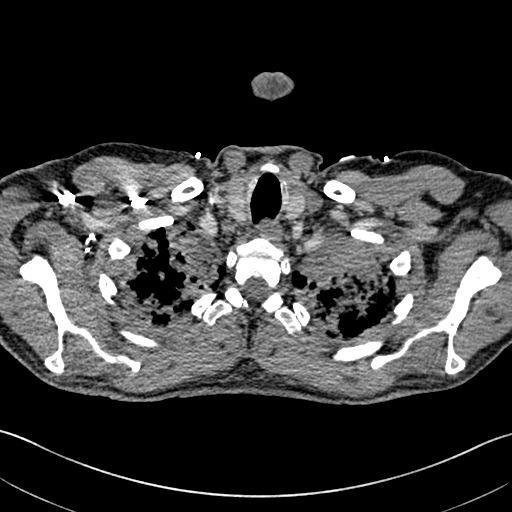
[im 286/303  lung]
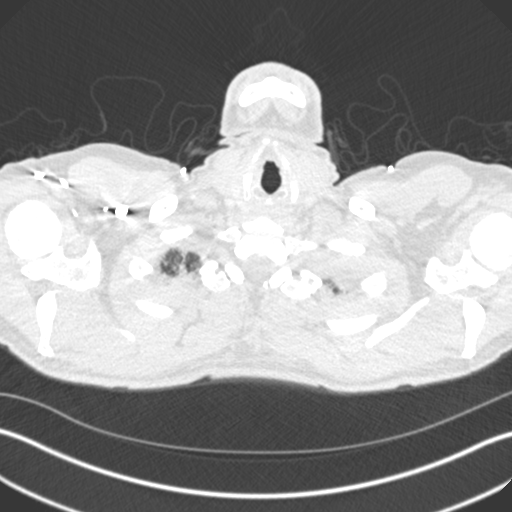

[Series 7: coronal mpr · coronal · 0.59mm/px · 1 of 116 slices shown]
[im 58/116  mediastinal]
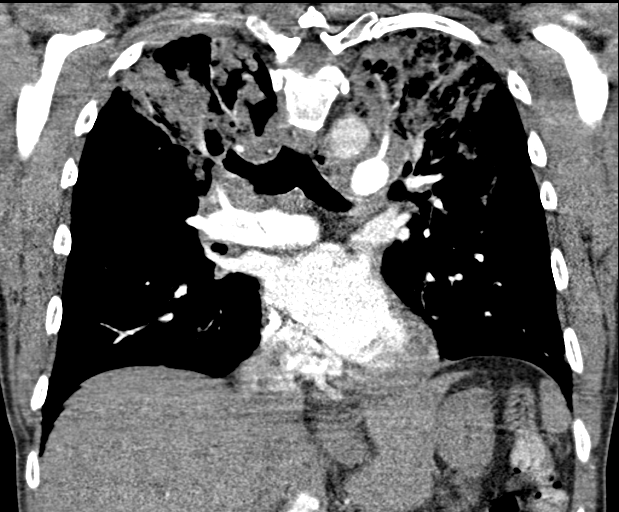

[18 of 36 positions shown; findings below may reference images not displayed]

FINDINGS: Cardiovascular: There are no filling defects within the pulmonary
arteries to suggest pulmonary embolus. Minimal aortic
atherosclerosis. No evidence of aortic dissection. Heart is normal
in size. Heart is normal in size. There are coronary artery
calcifications. No significant pericardial effusion.

Mediastinum/Nodes: Multifocal mediastinal and bilateral hilar
adenopathy. Largest right hilar node measures 16 mm short axis. No
thyroid nodule. Decompressed esophagus

Lungs/Pleura: Extensive bilateral lung consolidations. Upper lobe
predominant with additional involvement of the superior segment of
the lower lobes, minimally the right middle lobe and lingula.
Opacities are ground-glass and consolidative with central
bronchograms. No definite cavitary component. No pleural effusion.
Minimal retained mucus in the trachea. Mainstem bronchi are patent.

Upper Abdomen: No acute findings.

Musculoskeletal: There are no acute or suspicious osseous
abnormalities.

Review of the MIP images confirms the above findings.
IMPRESSION: 1. No pulmonary embolus.
2. Extensive upper lobe predominant bilateral lung consolidations,
ground-glass and consolidative with central bronchograms. Findings
are most consistent with multifocal pneumonia. Recommend close
clinical and radiographic to ensure resolution. Atypical organisms
and inflammatory pneumonia is are considered.
3. Multifocal mediastinal and hilar adenopathy, likely reactive.
4. Coronary artery calcifications.

## 2021-04-18 MED ORDER — IPRATROPIUM-ALBUTEROL 0.5-2.5 (3) MG/3ML IN SOLN: RESPIRATORY_TRACT | Status: DC

## 2021-04-18 MED ORDER — PREDNISONE 20 MG PO TABS: 40.0000 mg | ORAL_TABLET | Freq: Every day | ORAL | 0 refills | Status: DC

## 2021-04-18 NOTE — Plan of Care (Signed)
Education completed with Patient. Patient ready for discharge to home  Problem: Education: Goal: Knowledge of General Education information will improve Description: Including pain rating scale, medication(s)/side effects and non-pharmacologic comfort measures Outcome: Adequate for Discharge   Problem: Health Behavior/Discharge Planning: Goal: Ability to manage health-related needs will improve Outcome: Adequate for Discharge   Problem: Clinical Measurements: Goal: Ability to maintain clinical measurements within normal limits will improve Outcome: Adequate for Discharge Goal: Will remain free from infection Outcome: Adequate for Discharge Goal: Diagnostic test results will improve Outcome: Adequate for Discharge Goal: Respiratory complications will improve Outcome: Adequate for Discharge Goal: Cardiovascular complication will be avoided Outcome: Adequate for Discharge   Problem: Activity: Goal: Risk for activity intolerance will decrease Outcome: Adequate for Discharge   Problem: Nutrition: Goal: Adequate nutrition will be maintained Outcome: Adequate for Discharge   Problem: Coping: Goal: Level of anxiety will decrease Outcome: Adequate for Discharge   Problem: Elimination: Goal: Will not experience complications related to bowel motility Outcome: Adequate for Discharge Goal: Will not experience complications related to urinary retention Outcome: Adequate for Discharge   Problem: Pain Managment: Goal: General experience of comfort will improve Outcome: Adequate for Discharge   Problem: Safety: Goal: Ability to remain free from injury will improve Outcome: Adequate for Discharge

## 2021-04-18 NOTE — Discharge Instructions (Signed)
Edward Lee,  You were in the hospital with an asthma exacerbation. This required the use of non-invasive ventilator support, steroids and breathing treatments. You have improved significantly and are stable for discharge home. You can continue to schedule your nebulizer treatments while taking your prednisone; you will only need a few days of prednisone since you improved so quickly. Please follow-up with your primary care/pulmonologist with regard to your cough just to make sure you are not developing a pneumonia.

## 2021-04-18 NOTE — Progress Notes (Signed)
@Patient  ID: Edward Lee, male    DOB: Aug 01, 1955, 64 y.o.   MRN: [id number]  Chief Complaint  Patient presents with   Hospitalization Follow-up    Pt states he is recovering well after recent hospital visit. States he is coughing and states the cough is worse first thing in the morning and also at night.    Referring provider: [name], MD  HPI: 65 year old male, never smoked. PMH significant for severe persistent asthma, chronic sinusitis. Patient of Dr. Vaughan Browner.  04/19/2021- Interim hx  Patient presents today for hospital follow-up. He was admitted to SDU for 31 hoursfor acute asthma exacerbation. Treated with BIPAP, steroids and nebs. CXR negative for pneumonia. Covid negative. Eos absolute 1.6. He was discharged home on home BD regimen and prednisone. Last exacerbation was in July 2022 treated with prednisone by Dr. Vaughan Browner. He is maintained on BREO 268mcg, Singulair, prn ventolin. He continues to have a persistent moderate cough. His chronic sinus symptoms have improved. He started prednisone today.   Following with Dr. Johnnette Gourd for postnasal drip/chronic sinusitis. He had nasal polyp surgery in April. Continues Budeonside irrigations. CT scan sinuses on 03/29/21 showed minimal mucosal thickening, greatly improved. Patent sinus drainage pathways.    Allergies  Allergen Reactions   Codeine     REACTION: nausea vomiting    Immunization History  Administered Date(s) Administered   PFIZER(Purple Top)SARS-COV-2 Vaccination 08/20/2019, 09/10/2019, 05/10/2020   Pneumococcal Conjugate-13 05/24/2020   Pneumococcal Polysaccharide-23 12/04/2018   Td 01/14/1997, 01/20/2008   Tdap 06/17/2018    Past Medical History:  Diagnosis Date   Allergy    Chronic lower back pain    Herniated disc    L1   Hypercholesteremia    IBS (irritable bowel syndrome)    ? possible   Prostate cancer ([place])     Tobacco History: Social History   Tobacco Use  Smoking Status Never   Smokeless Tobacco Never   Counseling given: Not Answered   Outpatient Medications Prior to Visit  Medication Sig Dispense Refill   albuterol (VENTOLIN HFA) 108 (90 Base) MCG/ACT inhaler Inhale 2 puffs into the lungs every 6 (six) hours as needed for wheezing or shortness of breath. 54 g 3   Ascorbic Acid (VITAMIN C) 1000 MG tablet Take 1,000 mg by mouth daily.     citalopram (CELEXA) 40 MG tablet Take 1 tablet (40 mg total) by mouth daily. 90 tablet 2   fluticasone furoate-vilanterol (BREO ELLIPTA) 200-25 MCG/INH AEPB USE 1 INHALATION ORALLY    DAILY (Patient taking differently: Inhale 1 puff into the lungs daily.) 180 each 3   ipratropium-albuterol (DUONEB) 0.5-2.5 (3) MG/3ML SOLN Take 3 mLs by nebulization every 4 (four) hours while awake for 3 days, THEN 3 mLs every 6 (six) hours as needed (shortness of breath).     montelukast (SINGULAIR) 10 MG tablet Take 1 tablet (10 mg total) by mouth at bedtime. 30 tablet 11   Multiple Vitamin (MULTIVITAMIN WITH MINERALS) TABS tablet Take 1 tablet by mouth daily.     simvastatin (ZOCOR) 20 MG tablet Take 1 tablet (20 mg total) by mouth at bedtime. 90 tablet 2   predniSONE (DELTASONE) 20 MG tablet Take 2 tablets (40 mg total) by mouth daily with breakfast for 3 days. 6 tablet 0   No facility-administered medications prior to visit.    Review of Systems  Review of Systems  Constitutional: Negative.   HENT: Negative.    Respiratory:  Positive for cough.  Physical Exam  BP 124/80 (BP Location: Left Arm, Patient Position: Sitting, Cuff Size: Normal)   Pulse 95   Temp 98.3 F (36.8 C) (Oral)   Ht 6\' 2"  (1.88 m)   Wt 191 lb 12.8 oz (87 kg)   SpO2 94% Comment: RA  BMI 24.63 kg/m  Physical Exam Constitutional:      Appearance: Normal appearance.  HENT:     Head: Normocephalic and atraumatic.     Mouth/Throat:     Comments: Deferred d/t masking Cardiovascular:     Rate and Rhythm: Normal rate and regular rhythm.  Pulmonary:      Effort: Pulmonary effort is normal.     Breath sounds: Normal breath sounds. No wheezing, rhonchi or rales.  Musculoskeletal:        General: Normal range of motion.  Neurological:     General: No focal deficit present.     Mental Status: He is alert and oriented to person, place, and time. Mental status is at baseline.  Psychiatric:        Mood and Affect: Mood normal.        Behavior: Behavior normal.        Thought Content: Thought content normal.        Judgment: Judgment normal.     Lab Results:  CBC    Component Value Date/Time   WBC 14.8 (H) 04/18/2021 0234   RBC 4.69 04/18/2021 0234   HGB 14.5 04/18/2021 0234   HGB 15.6 11/17/2018 1533   HCT 42.5 04/18/2021 0234   HCT 45.1 11/17/2018 1533   PLT 207 04/18/2021 0234   PLT 208 11/17/2018 1533   MCV 90.6 04/18/2021 0234   MCV 90 11/17/2018 1533   MCH 30.9 04/18/2021 0234   MCHC 34.1 04/18/2021 0234   RDW 14.1 04/18/2021 0234   RDW 12.4 11/17/2018 1533   LYMPHSABS 2.6 04/17/2021 0447   LYMPHSABS 1.2 11/17/2018 1533   MONOABS 0.8 04/17/2021 0447   EOSABS 1.6 (H) 04/17/2021 0447   EOSABS 0.7 (H) 11/17/2018 1533   BASOSABS 0.0 04/17/2021 0447   BASOSABS 0.0 11/17/2018 1533    BMET    Component Value Date/Time   NA 135 04/18/2021 0234   K 4.7 04/18/2021 0234   CL 103 04/18/2021 0234   CO2 25 04/18/2021 0234   GLUCOSE 158 (H) 04/18/2021 0234   BUN 18 04/18/2021 0234   CREATININE 0.80 04/18/2021 0234   CREATININE 1.19 05/31/2020 1111   CALCIUM 9.3 04/18/2021 0234   GFRNONAA >60 04/18/2021 0234   GFRNONAA 69 05/24/2020 1400   GFRAA 80 05/24/2020 1400    BNP    Component Value Date/Time   BNP 46.0 04/17/2021 0447    ProBNP No results found for: PROBNP  Imaging: MR PROSTATE W WO CONTRAST  Result Date: 03/22/2021 CLINICAL DATA:  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 EXAM: MR PROSTATE WITHOUT AND WITH CONTRAST TECHNIQUE: Multiplanar multisequence  MRI images were obtained of the pelvis centered about the prostate. Pre and post contrast images were obtained. CONTRAST:  70mL MULTIHANCE GADOBENATE DIMEGLUMINE 529 MG/ML IV SOLN COMPARISON:  None. FINDINGS: Prostate: Region of interest # 1: PI-RADS category 3 lesion of the left posterolateral peripheral zone extending from the base to the apex, with reduced T2 signal but no restricted diffusion or early enhancement. This measures 0.53 cc (1.0 by 0.8 by 1.1 cm) and is shown for example on image 40 of series 9. Region of interest #  2: Very small PI-RADS category 4 lesion of the right posterolateral peripheral zone at the apex with reduced T2 signal and faint focal early enhancement. This measures 0.18 cc (0.7 by 0.4 by 0.7 cm) and is shown for example on image 53 series 9. Region of interest # 3: PI-RADS category 3 lesion of the left posterolateral peripheral zone at the apex with reduced T2 signal but no early enhancement or restricted diffusion. This measures 0.31 cc (1.0 by 0.6 by 0.6 cm) and is shown for example on image 54 series 9. Volume: 3D volumetric analysis: Prostate volume 25.34 cc (4.5 by 3.5 by 3.6 cm). Transcapsular spread:  Absent Seminal vesicle involvement: Absent Neurovascular bundle involvement: Absent Pelvic adenopathy: Absent Bone metastasis: Absent Other findings: No supplemental non-categorized findings. IMPRESSION: 1. Very small PI-RADS category 4 lesion of the right posterolateral peripheral zone at the apex with reduced T2 signal and faint focal early enhancement. 2. Two PI-RADS category 3 lesions are present in the left peripheral zone. 3. Targeting data sent to [place]. Electronically Signed   By: Van Clines M.D.   On: 03/22/2021 10:06   DG Chest Portable 1 View  Result Date: 04/17/2021 CLINICAL DATA:  65 year old male with history of dyspnea. EXAM: PORTABLE CHEST 1 VIEW COMPARISON:  Chest x-ray 09/22/2019. FINDINGS: Skin fold artifact projecting over the upper right  hemithorax. Lung volumes are normal. Slight interstitial prominence throughout the upper lobes bilaterally, with elevation of the minor fissure. No consolidative airspace disease. No pleural effusions. No pneumothorax. No pulmonary nodule or mass noted. Pulmonary vasculature and the cardiomediastinal silhouette are within normal limits. Atherosclerosis in the thoracic aorta. IMPRESSION: 1. No radiographic evidence of acute cardiopulmonary disease. 2. Aortic atherosclerosis. 3. Mild chronic scarring in the upper lobes of the lungs bilaterally. Electronically Signed   By: Vinnie Langton M.D.   On: 04/17/2021 05:19     Assessment & Plan:   Severe persistent asthma with (acute) exacerbation - Severe persistent asthma with recurrent exacerbations requiring oral steroids > 2 times a year and recent hospitalization. He is on maintenance high dose ICS/LABA inhaler and Singulair 10mg  qhs. Recommend starting patient on Biologic, specifically Nucala, d.t eosinophilia and hx nasal polyps. Reviewed risk and benefit of medication. Patient will be set up with our pharmacy team for new start. Continue Breo 269mcg one puff daily, prn albuterol hfa/nebulizer q 6 hours for breakthrough symptoms. Sending in RX for prednisone taper to have on hand for asthma exacerbation. FU in December with Dr. Vaughan Browner.   Recurrent maxillary sinusitis - Patient underwent nasal polyp surgery in April 2022 with Dr. Redmond Baseman. Continues Budeonside irrigations. CT scan sinuses on 03/29/21 showed minimal mucosal thickening, greatly improved  40 mins spent on case; > 50% face to face  Martyn Ehrich, NP 04/19/2021

## 2021-04-18 NOTE — Discharge Summary (Addendum)
Physician Discharge Summary  Edward Lee WFU:[id number] DOB: March 05, 1956 DOA: 04/17/2021  PCP: [name], MD  Admit date: 04/17/2021 Discharge date: 04/18/2021  Admitted From: Home Disposition: Home  Recommendations for Outpatient Follow-up:  Follow up with PCP in 1 week Please follow up on the following pending results: None  Home Health: None Equipment/Devices: None  Discharge Condition: Stable CODE STATUS: Full code Diet recommendation: Regular diet   Brief/Interim Summary:  Admission HPI written by Jonnie Finner, DO  HPI: TEAGON KRON is a 65 y.o. male with medical history significant of asthma, HLD, IBS. Presenting with dyspnea. History is limited as patient is on BiPap and it is difficult for him to speak. He reports that his symptoms began 3 days ago. He tried his regular meds and rescue inhalers; but they did not help. This morning his symptoms were unbearable, so he came to the ED.   Hospital course:  Asthma exacerbation Acute respiratory failure with hypoxia Unknown precipitating factor. Patient significantly ill on admission requiring BiPAP for respiratory distress with hypoxia. Patient started on Solu-medrol. Patient improved quickly and was transitioned to oxygen via nasal canula. Patient continued with Duonebs with continued improvement. Oxygen weaned off and patient ambulated well on room air. Patient discharged to with prednisone to complete 5 days of antibiotics in addition to recommendations for scheduled Duonebs q4 hours while on steroid burst.  Hyperlipidemia Continue simvastatin.  Anxiety Continue Celexa  Discharge Diagnoses:  Active Problems:   Asthma exacerbation    Discharge Instructions  Discharge Instructions     Call MD for:  difficulty breathing, headache or visual disturbances   Complete by: As directed    Call MD for:  extreme fatigue   Complete by: As directed    Call MD for:  persistant dizziness or light-headedness    Complete by: As directed       Allergies as of 04/18/2021       Reactions   Codeine    REACTION: nausea vomiting        Medication List     STOP taking these medications    budesonide 0.5 MG/2ML nebulizer solution Commonly known as: PULMICORT       TAKE these medications    albuterol 108 (90 Base) MCG/ACT inhaler Commonly known as: VENTOLIN HFA Inhale 2 puffs into the lungs every 6 (six) hours as needed for wheezing or shortness of breath.   citalopram 40 MG tablet Commonly known as: CELEXA Take 1 tablet (40 mg total) by mouth daily.   ipratropium-albuterol 0.5-2.5 (3) MG/3ML Soln Commonly known as: DUONEB Take 3 mLs by nebulization every 4 (four) hours while awake for 3 days, THEN 3 mLs every 6 (six) hours as needed (shortness of breath). Start taking on: April 18, 2021 What changed: See the new instructions.   montelukast 10 MG tablet Commonly known as: SINGULAIR Take 1 tablet (10 mg total) by mouth at bedtime.   multivitamin with minerals Tabs tablet Take 1 tablet by mouth daily.   simvastatin 20 MG tablet Commonly known as: ZOCOR Take 1 tablet (20 mg total) by mouth at bedtime.   vitamin C 1000 MG tablet Take 1,000 mg by mouth daily.        Follow-up Information     [name], MD. Schedule an appointment as soon as possible for a visit in 1 week(s).   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 EXAM: MR PROSTATE WITHOUT AND WITH CONTRAST TECHNIQUE: Multiplanar multisequence MRI images were obtained of the pelvis centered about the prostate.  Pre and post contrast images were obtained. CONTRAST:  27mL MULTIHANCE GADOBENATE DIMEGLUMINE 529 MG/ML IV SOLN COMPARISON:  None. FINDINGS: Prostate: Region of interest # 1: PI-RADS category 3 lesion of the left posterolateral peripheral zone extending from the base to the apex, with reduced T2 signal but no restricted diffusion or early enhancement. This measures 0.53 cc (1.0 by 0.8 by 1.1 cm) and is shown for example on image 40 of series 9. Region of interest # 2: Very small PI-RADS category 4 lesion of the right posterolateral peripheral zone at the apex with reduced T2 signal and faint focal early enhancement. This measures 0.18 cc (0.7 by 0.4 by 0.7 cm) and is shown for example on image 53 series 9. Region of interest # 3: PI-RADS category 3 lesion of the left posterolateral peripheral zone at the apex with reduced T2 signal but no early enhancement or restricted diffusion. This measures 0.31 cc (1.0 by 0.6 by 0.6 cm) and is shown for example on image 54 series 9. Volume: 3D volumetric analysis: Prostate volume 25.34 cc (4.5 by 3.5 by 3.6 cm). Transcapsular spread:  Absent Seminal vesicle involvement: Absent Neurovascular bundle involvement: Absent Pelvic adenopathy: Absent Bone metastasis: Absent Other findings: No supplemental non-categorized findings. IMPRESSION: 1. Very small PI-RADS category 4 lesion of the right posterolateral peripheral zone at the apex with reduced T2 signal and faint focal early enhancement. 2. Two PI-RADS category 3 lesions are present in the left peripheral zone. 3. Targeting data sent to [place]. Electronically Signed   By: Van Clines M.D.   On: 03/22/2021 10:06   DG Chest Portable 1 View  Result Date: 04/17/2021 CLINICAL DATA:  65 year old male with history of dyspnea. EXAM: PORTABLE CHEST 1 VIEW COMPARISON:  Chest x-ray 09/22/2019. FINDINGS: Skin fold artifact projecting over the upper right hemithorax. Lung volumes are normal. Slight interstitial prominence  throughout the upper lobes bilaterally, with elevation of the minor fissure. No consolidative airspace disease. No pleural effusions. No pneumothorax. No pulmonary nodule or mass noted. Pulmonary vasculature and the cardiomediastinal silhouette are within normal limits. Atherosclerosis in the thoracic aorta. IMPRESSION: 1. No radiographic evidence of acute cardiopulmonary disease. 2. Aortic atherosclerosis. 3. Mild chronic scarring in the upper lobes of the lungs bilaterally. Electronically Signed   By: Vinnie Langton M.D.   On: 04/17/2021 05:19      Subjective: Breathing much better today  Discharge Exam: Vitals:   04/18/21 0900 04/18/21 1000  BP: (!) 148/89   Pulse: 95 94  Resp: 19 (!) 22  Temp:    SpO2: 96% 93%   Vitals:   04/18/21 0724 04/18/21 0800 04/18/21 0900 04/18/21 1000  BP:  (!) 168/88 (!) 148/89   Pulse:  84 95 94  Resp:  20 19 (!) 22  Temp:  98 F (36.7 C)    TempSrc:  Oral    SpO2: 96% 96% 96% 93%  Weight:      Height:        General: Pt is alert, awake, not in acute distress Cardiovascular: RRR, S1/S2 +, no rubs, no gallops Respiratory: Mild wheezing  but good air movement Abdominal: Soft, NT, ND, bowel sounds + Extremities: no edema, no cyanosis    The results of significant diagnostics from this hospitalization (including imaging, microbiology, ancillary and laboratory) are listed below for reference.     Microbiology: Recent Results (from the past 240 hour(s))  Resp Panel by RT-PCR (Flu A&B, Covid) Nasopharyngeal Swab     Status: None   Collection Time: 04/17/21  4:49 AM   Specimen: Nasopharyngeal Swab; Nasopharyngeal(NP) swabs in vial transport medium  Result Value Ref Range Status   SARS Coronavirus 2 by RT PCR NEGATIVE NEGATIVE Final    Comment: (NOTE) SARS-CoV-2 target nucleic acids are NOT DETECTED.  The SARS-CoV-2 RNA is generally detectable in upper respiratory specimens during the acute phase of infection. The lowest concentration of  SARS-CoV-2 viral copies this assay can detect is 138 copies/mL. A negative result does not preclude SARS-Cov-2 infection and should not be used as the sole basis for treatment or other patient management decisions. A negative result may occur with  improper specimen collection/handling, submission of specimen other than nasopharyngeal swab, presence of viral mutation(s) within the areas targeted by this assay, and inadequate number of viral copies(<138 copies/mL). A negative result must be combined with clinical observations, patient history, and epidemiological information. The expected result is Negative.  Fact Sheet for Patients:  EntrepreneurPulse.com.au  Fact Sheet for Healthcare Providers:  IncredibleEmployment.be  This test is no t yet approved or cleared by the [place] FDA and  has been authorized for detection and/or diagnosis of SARS-CoV-2 by FDA under an Emergency Use Authorization (EUA). This EUA will remain  in effect (meaning this test can be used) for the duration of the COVID-19 declaration under Section 564(b)(1) of the Act, 21 U.S.C.section 360bbb-3(b)(1), unless the authorization is terminated  or revoked sooner.       Influenza A by PCR NEGATIVE NEGATIVE Final   Influenza B by PCR NEGATIVE NEGATIVE Final    Comment: (NOTE) The Xpert Xpress SARS-CoV-2/FLU/RSV plus assay is intended as an aid in the diagnosis of influenza from Nasopharyngeal swab specimens and should not be used as a sole basis for treatment. Nasal washings and aspirates are unacceptable for Xpert Xpress SARS-CoV-2/FLU/RSV testing.  Fact Sheet for Patients: EntrepreneurPulse.com.au  Fact Sheet for Healthcare Providers: IncredibleEmployment.be  This test is not yet approved or cleared by the [place] FDA and has been authorized for detection and/or diagnosis of SARS-CoV-2 by FDA under an Emergency Use  Authorization (EUA). This EUA will remain in effect (meaning this test can be used) for the duration of the COVID-19 declaration under Section 564(b)(1) of the Act, 21 U.S.C. section 360bbb-3(b)(1), unless the authorization is terminated or revoked.  Performed at [facility], [place] [address] [place], [place] [address]   Respiratory (~20 pathogens) panel by PCR     Status: None   Collection Time: 04/17/21  8:44 AM   Specimen: Nasopharyngeal Swab; Respiratory  Result Value Ref Range Status   Adenovirus NOT DETECTED NOT DETECTED Final   Coronavirus 229E NOT DETECTED NOT DETECTED Final    Comment: (NOTE) The Coronavirus on the Respiratory Panel, DOES NOT test for the novel  Coronavirus (2019 nCoV)    Coronavirus HKU1 NOT DETECTED NOT DETECTED Final   Coronavirus NL63 NOT DETECTED NOT DETECTED Final   Coronavirus OC43 NOT DETECTED NOT DETECTED Final   Metapneumovirus NOT DETECTED NOT DETECTED Final   Rhinovirus / Enterovirus NOT DETECTED NOT DETECTED Final   Influenza A NOT DETECTED  NOT DETECTED Final   Influenza B NOT DETECTED NOT DETECTED Final   Parainfluenza Virus 1 NOT DETECTED NOT DETECTED Final   Parainfluenza Virus 2 NOT DETECTED NOT DETECTED Final   Parainfluenza Virus 3 NOT DETECTED NOT DETECTED Final   Parainfluenza Virus 4 NOT DETECTED NOT DETECTED Final   Respiratory Syncytial Virus NOT DETECTED NOT DETECTED Final   Bordetella pertussis NOT DETECTED NOT DETECTED Final   Bordetella Parapertussis NOT DETECTED NOT DETECTED Final   Chlamydophila pneumoniae NOT DETECTED NOT DETECTED Final   Mycoplasma pneumoniae NOT DETECTED NOT DETECTED Final    Comment: Performed at [facility], [place] [address], [place], [place] [address]     Labs: BNP (last 3 results) Recent Labs    04/17/21 0447  BNP 99.3   Basic Metabolic Panel: Recent Labs  Lab 04/17/21 0447 04/17/21 0459 04/18/21 0234  NA 136 138 135  K 4.3 4.8 4.7  CL 102 107 103  CO2 26  --   25  GLUCOSE 126* 130* 158*  BUN 22 29* 18  CREATININE 1.26* 1.20 0.80  CALCIUM 8.9  --  9.3   Liver Function Tests: Recent Labs  Lab 04/17/21 0447 04/18/21 0234  AST 39 26  ALT 24 21  ALKPHOS 40 32*  BILITOT 0.7 0.9  PROT 8.0 7.3  ALBUMIN 4.3 4.0   No results for input(s): LIPASE, AMYLASE in the last 168 hours. No results for input(s): AMMONIA in the last 168 hours. CBC: Recent Labs  Lab 04/17/21 0447 04/17/21 0459 04/18/21 0234  WBC 11.4*  --  14.8*  NEUTROABS 6.3  --   --   HGB 13.1 15.6 14.5  HCT 40.1 46.0 42.5  MCV 95.2  --  90.6  PLT 191  --  207   Cardiac Enzymes: No results for input(s): CKTOTAL, CKMB, CKMBINDEX, TROPONINI in the last 168 hours. BNP: Invalid input(s): POCBNP CBG: No results for input(s): GLUCAP in the last 168 hours. D-Dimer No results for input(s): DDIMER in the last 72 hours. Hgb A1c No results for input(s): HGBA1C in the last 72 hours. Lipid Profile No results for input(s): CHOL, HDL, LDLCALC, TRIG, CHOLHDL, LDLDIRECT in the last 72 hours. Thyroid function studies No results for input(s): TSH, T4TOTAL, T3FREE, THYROIDAB in the last 72 hours.  Invalid input(s): FREET3 Anemia work up No results for input(s): VITAMINB12, FOLATE, FERRITIN, TIBC, IRON, RETICCTPCT in the last 72 hours. Urinalysis    Component Value Date/Time   COLORURINE YELLOW 08/04/2019 2257   APPEARANCEUR CLEAR 08/04/2019 2257   LABSPEC 1.010 08/04/2019 2257   PHURINE 6.0 08/04/2019 2257   GLUCOSEU NEGATIVE 08/04/2019 2257   HGBUR NEGATIVE 08/04/2019 2257   HGBUR negative 03/02/2010 0848   BILIRUBINUR NEGATIVE 08/04/2019 2257   BILIRUBINUR Negative 12/14/2014 1054   KETONESUR NEGATIVE 08/04/2019 2257   PROTEINUR NEGATIVE 08/04/2019 2257   UROBILINOGEN 0.2 12/14/2014 1054   UROBILINOGEN 0.2 03/02/2010 0848   NITRITE NEGATIVE 08/04/2019 2257   LEUKOCYTESUR NEGATIVE 08/04/2019 2257   Sepsis Labs Invalid input(s): PROCALCITONIN,  WBC,   LACTICIDVEN Microbiology Recent Results (from the past 240 hour(s))  Resp Panel by RT-PCR (Flu A&B, Covid) Nasopharyngeal Swab     Status: None   Collection Time: 04/17/21  4:49 AM   Specimen: Nasopharyngeal Swab; Nasopharyngeal(NP) swabs in vial transport medium  Result Value Ref Range Status   SARS Coronavirus 2 by RT PCR NEGATIVE NEGATIVE Final    Comment: (NOTE) SARS-CoV-2 target nucleic acids are NOT DETECTED.  The SARS-CoV-2 RNA  is generally detectable in upper respiratory specimens during the acute phase of infection. The lowest concentration of SARS-CoV-2 viral copies this assay can detect is 138 copies/mL. A negative result does not preclude SARS-Cov-2 infection and should not be used as the sole basis for treatment or other patient management decisions. A negative result may occur with  improper specimen collection/handling, submission of specimen other than nasopharyngeal swab, presence of viral mutation(s) within the areas targeted by this assay, and inadequate number of viral copies(<138 copies/mL). A negative result must be combined with clinical observations, patient history, and epidemiological information. The expected result is Negative.  Fact Sheet for Patients:  EntrepreneurPulse.com.au  Fact Sheet for Healthcare Providers:  IncredibleEmployment.be  This test is no t yet approved or cleared by the [place] FDA and  has been authorized for detection and/or diagnosis of SARS-CoV-2 by FDA under an Emergency Use Authorization (EUA). This EUA will remain  in effect (meaning this test can be used) for the duration of the COVID-19 declaration under Section 564(b)(1) of the Act, 21 U.S.C.section 360bbb-3(b)(1), unless the authorization is terminated  or revoked sooner.       Influenza A by PCR NEGATIVE NEGATIVE Final   Influenza B by PCR NEGATIVE NEGATIVE Final    Comment: (NOTE) The Xpert Xpress SARS-CoV-2/FLU/RSV plus  assay is intended as an aid in the diagnosis of influenza from Nasopharyngeal swab specimens and should not be used as a sole basis for treatment. Nasal washings and aspirates are unacceptable for Xpert Xpress SARS-CoV-2/FLU/RSV testing.  Fact Sheet for Patients: EntrepreneurPulse.com.au  Fact Sheet for Healthcare Providers: IncredibleEmployment.be  This test is not yet approved or cleared by the [place] FDA and has been authorized for detection and/or diagnosis of SARS-CoV-2 by FDA under an Emergency Use Authorization (EUA). This EUA will remain in effect (meaning this test can be used) for the duration of the COVID-19 declaration under Section 564(b)(1) of the Act, 21 U.S.C. section 360bbb-3(b)(1), unless the authorization is terminated or revoked.  Performed at [facility], [place] [address] [place], [place] [address]   Respiratory (~20 pathogens) panel by PCR     Status: None   Collection Time: 04/17/21  8:44 AM   Specimen: Nasopharyngeal Swab; Respiratory  Result Value Ref Range Status   Adenovirus NOT DETECTED NOT DETECTED Final   Coronavirus 229E NOT DETECTED NOT DETECTED Final    Comment: (NOTE) The Coronavirus on the Respiratory Panel, DOES NOT test for the novel  Coronavirus (2019 nCoV)    Coronavirus HKU1 NOT DETECTED NOT DETECTED Final   Coronavirus NL63 NOT DETECTED NOT DETECTED Final   Coronavirus OC43 NOT DETECTED NOT DETECTED Final   Metapneumovirus NOT DETECTED NOT DETECTED Final   Rhinovirus / Enterovirus NOT DETECTED NOT DETECTED Final   Influenza A NOT DETECTED NOT DETECTED Final   Influenza B NOT DETECTED NOT DETECTED Final   Parainfluenza Virus 1 NOT DETECTED NOT DETECTED Final   Parainfluenza Virus 2 NOT DETECTED NOT DETECTED Final   Parainfluenza Virus 3 NOT DETECTED NOT DETECTED Final   Parainfluenza Virus 4 NOT DETECTED NOT DETECTED Final   Respiratory Syncytial Virus NOT DETECTED NOT  DETECTED Final   Bordetella pertussis NOT DETECTED NOT DETECTED Final   Bordetella Parapertussis NOT DETECTED NOT DETECTED Final   Chlamydophila pneumoniae NOT DETECTED NOT DETECTED Final   Mycoplasma pneumoniae NOT DETECTED NOT DETECTED Final    Comment: Performed at [facility], [place]. [address], [place], [place] [address]  Time coordinating discharge: 35 minutes  SIGNED:   Cordelia Poche, MD [facility] 04/19/2021, 4:05 PM

## 2021-04-19 ENCOUNTER — Ambulatory Visit: Payer: Medicare Other | Admitting: Primary Care

## 2021-04-19 ENCOUNTER — Encounter: Payer: Self-pay | Admitting: Primary Care

## 2021-04-19 ENCOUNTER — Telehealth: Payer: Self-pay | Admitting: Pharmacist

## 2021-04-19 ENCOUNTER — Other Ambulatory Visit: Payer: Medicare Other | Admitting: Pharmacist

## 2021-04-19 ENCOUNTER — Other Ambulatory Visit: Payer: Self-pay

## 2021-04-19 ENCOUNTER — Other Ambulatory Visit: Payer: Self-pay | Admitting: Pulmonary Disease

## 2021-04-19 DIAGNOSIS — J0101 Acute recurrent maxillary sinusitis: Secondary | ICD-10-CM | POA: Diagnosis not present

## 2021-04-19 DIAGNOSIS — J4551 Severe persistent asthma with (acute) exacerbation: Secondary | ICD-10-CM | POA: Diagnosis not present

## 2021-04-19 MED ORDER — PREDNISONE 10 MG PO TABS: ORAL_TABLET | ORAL | 1 refills | Status: DC

## 2021-04-19 NOTE — Patient Instructions (Addendum)
Eos are normal white blood cell, increased levels worsen inflammation in your lungs and can cause severe asthma attacks Yours are elevated in 1500's ( normal is <200)  Nucala targets and reduces eosinophils to help reduce airway inflammation   Up to 75% decrease in asthma attacks  Reduces oral sterios and decreases hospitalizations   Recommendations: -- Take 40mg  prednisone x 5 days; then 20mg  x 5 days; then stop -- Prednisone taper to have on hand  - Start Singulair 10mg  atbedtime - Continue breo 234mcg one puff daily in the morning   Follow-up: - Needs visit with pharmacy to start Nucula start - Needs visit in December with Dr. Vaughan Browner

## 2021-04-19 NOTE — Assessment & Plan Note (Signed)
-   Patient underwent nasal polyp surgery in April 2022 with Dr. Redmond Baseman. Continues Budeonside irrigations. CT scan sinuses on 03/29/21 showed minimal mucosal thickening, greatly improved

## 2021-04-19 NOTE — Assessment & Plan Note (Addendum)
-   Severe persistent asthma with recurrent exacerbations requiring oral steroids > 2 times a year and recent hospitalization. He is on maintenance high dose ICS/LABA inhaler and Singulair 10mg  qhs. Recommend starting patient on Biologic, specifically Nucala, d.t eosinophilia and hx nasal polyps. Reviewed risk and benefit of medication. Patient will be set up with our pharmacy team for new start. Continue Breo 224mcg one puff daily, prn albuterol hfa/nebulizer q 6 hours for breakthrough symptoms. Sending in RX for prednisone taper to have on hand for asthma exacerbation. FU in December with Dr. Vaughan Browner.

## 2021-04-19 NOTE — Telephone Encounter (Signed)
Please start Nucala autoinjector BIV.  Dose: 100mg  every 4 weeks  Dx: Severe persistent asthma (A06.01)  PAP application (including signed patient portion and provider portion) placed in PAP pending info folder to be submitted once response for prior auth is received  Knox Saliva, PharmD, MPH, BCPS Clinical Pharmacist (Rheumatology and Pulmonology)

## 2021-04-20 ENCOUNTER — Telehealth: Payer: Self-pay

## 2021-04-20 ENCOUNTER — Telehealth: Payer: Self-pay | Admitting: Primary Care

## 2021-04-20 NOTE — Telephone Encounter (Signed)
Submitted a Prior Authorization request to [facility] for [place] via CoverMyMeds. Will update once we receive a response.   (Key: [id number])

## 2021-04-20 NOTE — Telephone Encounter (Cosign Needed)
Transition Care Management Follow-up Telephone Call Date of discharge and from where: [facility] 04/18/21 How have you been since you were released from the hospital? 2nd full day recovering and breathing well  Any questions or concerns? No  Items Reviewed: Did the pt receive and understand the discharge instructions provided? Yes  Medications obtained and verified? Yes  Other? No  Any new allergies since your discharge? No  Dietary orders reviewed? Yes Do you have support at home? Yes   Home Care and Equipment/Supplies: Were home health services ordered? not applicable If so, what is the name of the agency?   Has the agency set up a time to come to the patient's home? not applicable Were any new equipment or medical supplies ordered?  No What is the name of the medical supply agency?  Were you able to get the supplies/equipment? not applicable Do you have any questions related to the use of the equipment or supplies? No  Functional Questionnaire: (I = Independent and D = Dependent) ADLs:  I  Bathing/Dressing- I  Meal Prep- I  Eating- I  Maintaining continence- I  Transferring/Ambulation- I  Managing Meds- I  Follow up appointments reviewed:  PCP Hospital f/u appt confirmed? No  pt stated he will follow up with Pulmonary Specialist Hospital f/u appt confirmed? Yes  Scheduled to see Pulmonary on 04/19/21 Are transportation arrangements needed? No  If their condition worsens, is the pt aware to call PCP or go to the Emergency Dept.? Yes Was the patient provided with contact information for the PCP's office or ED? Yes Was to pt encouraged to call back with questions or concerns? Yes

## 2021-04-20 NOTE — Telephone Encounter (Signed)
I agree-following up with pulmonary is reasonable-as hospitalization was for pulmonary issue

## 2021-04-20 NOTE — Telephone Encounter (Signed)
Returned call and answered clinical questions. Updates will be in previous encounter.

## 2021-04-20 NOTE — Telephone Encounter (Signed)
Will forward to the injection team.

## 2021-04-21 ENCOUNTER — Telehealth: Payer: Self-pay | Admitting: Pulmonary Disease

## 2021-04-24 NOTE — Telephone Encounter (Signed)
Office received call from [employer] has denied the med authorization. You can call [phone] option 5  Awaiting denial letter for plan reasoning.

## 2021-04-24 NOTE — Telephone Encounter (Signed)
Noted. Updated will be in previous encounter.

## 2021-04-25 NOTE — Telephone Encounter (Signed)
Placed call to have denial faxed to Pharmacy team.

## 2021-04-26 ENCOUNTER — Other Ambulatory Visit (HOSPITAL_COMMUNITY): Payer: Self-pay

## 2021-04-26 NOTE — Telephone Encounter (Signed)
See above, just for update on biologic

## 2021-04-26 NOTE — Telephone Encounter (Signed)
Received a fax regarding Prior Authorization from [facility] for Edward Lee. Authorization has been DENIED because patient has not tried/ failed, or have contraindication to the formulary preferred option- Dupixent.  Phone# [phone]  Would we like to appeal for Nucala or start PA for Dupixent?

## 2021-04-26 NOTE — Telephone Encounter (Signed)
We can start PA for Dupixent

## 2021-04-26 NOTE — Telephone Encounter (Signed)
Submitted a Prior Authorization request to [facility] for [place] via CoverMyMeds. Will update once we receive a response.   (Key: [id number])

## 2021-04-26 NOTE — Telephone Encounter (Signed)
Received notification from [facility] regarding a prior authorization for DUPIXENT 300mg  PENS . Authorization has been APPROVED from 04/26/21 to 04/26/22.   Per test claim, copay for 28 days supply is $1,923.65  Authorization # [id number]  Patient will need to apply for Edward Lee, will start paperwork

## 2021-04-26 NOTE — Telephone Encounter (Signed)
Provider portion for Dupixent [employer] PAP application placed in Dr. Matilde Bash mailbox.  Patient portion placed in filing cabinet and patient is aware to stop by clinic to sign form.   Knox Saliva, PharmD, MPH, BCPS Clinical Pharmacist (Rheumatology and Pulmonology)

## 2021-05-05 ENCOUNTER — Ambulatory Visit: Payer: Medicare Other | Admitting: Pulmonary Disease

## 2021-05-05 NOTE — Telephone Encounter (Signed)
Provider portion received. Still awaiting patient portion.

## 2021-05-08 NOTE — Telephone Encounter (Signed)
Called patient regarding Dupixent PAP paperwork. He states that he believes the Breo Ellipta (1 puff once daily) and montelukast 10mg  nightly is working well for him and does not wish to escalate to [place] at this time. He would like to wait for some time to see how effective Breo and montelukast are before starting biologic.  Routing to Dr. Vaughan Browner as Juluis Rainier. Will send provider portion of application to scan center.  Knox Saliva, PharmD, MPH, BCPS Clinical Pharmacist (Rheumatology and Pulmonology)

## 2021-05-23 DIAGNOSIS — C61 Malignant neoplasm of prostate: Secondary | ICD-10-CM | POA: Diagnosis not present

## 2021-05-29 DIAGNOSIS — C61 Malignant neoplasm of prostate: Secondary | ICD-10-CM | POA: Diagnosis not present

## 2021-06-06 DIAGNOSIS — J320 Chronic maxillary sinusitis: Secondary | ICD-10-CM | POA: Diagnosis not present

## 2021-06-15 ENCOUNTER — Ambulatory Visit: Payer: Medicare Other | Admitting: Pulmonary Disease

## 2021-07-25 ENCOUNTER — Ambulatory Visit: Payer: Medicare Other | Admitting: Pulmonary Disease

## 2021-08-28 DIAGNOSIS — H6983 Other specified disorders of Eustachian tube, bilateral: Secondary | ICD-10-CM | POA: Diagnosis not present

## 2021-08-28 DIAGNOSIS — J320 Chronic maxillary sinusitis: Secondary | ICD-10-CM | POA: Diagnosis not present

## 2021-08-29 DIAGNOSIS — J324 Chronic pansinusitis: Secondary | ICD-10-CM | POA: Diagnosis not present

## 2021-08-30 ENCOUNTER — Ambulatory Visit: Payer: Medicare Other | Admitting: Pulmonary Disease

## 2021-08-30 ENCOUNTER — Other Ambulatory Visit: Payer: Self-pay

## 2021-08-30 ENCOUNTER — Encounter: Payer: Self-pay | Admitting: Pulmonary Disease

## 2021-08-30 ENCOUNTER — Ambulatory Visit (INDEPENDENT_AMBULATORY_CARE_PROVIDER_SITE_OTHER): Payer: Medicare Other

## 2021-08-30 VITALS — BP 122/64 | HR 82 | Temp 100.8°F | Ht 74.0 in | Wt 195.2 lb

## 2021-08-30 DIAGNOSIS — J0101 Acute recurrent maxillary sinusitis: Secondary | ICD-10-CM | POA: Diagnosis not present

## 2021-08-30 DIAGNOSIS — R059 Cough, unspecified: Secondary | ICD-10-CM

## 2021-08-30 DIAGNOSIS — J4551 Severe persistent asthma with (acute) exacerbation: Secondary | ICD-10-CM | POA: Diagnosis not present

## 2021-08-30 MED ORDER — PREDNISONE 20 MG PO TABS: ORAL_TABLET | ORAL | 0 refills | Status: DC

## 2021-08-30 NOTE — Progress Notes (Signed)
ANTHONYMICHAEL Lee    [id number]    September 24, 1955  Primary Care Physician:Hunter, Brayton Mars, MD  Referring Physician: [name], MD [place],  [place] [address]  Chief complaint:  Follow up for asthma  HPI: 66 year old with history of allergies, hyperlipidemia, irritable bowel syndrome, prostate cancer.  Complains of increasing dyspnea since September 2018 after return from travel to [place].  Symptoms associated with chest congestion, wheezing mostly at night, nonproductive cough.  Denies any sputum production, fevers, chills.  He was treated with several rounds of prednisone which improves symptoms temporarily.  Started on Flovent in December 2018 by Dr. Yong Channel, primary care.   Referred from recurrent exacerbations throughout 2019 Inhalers changed to Breo and Spiriva. He has history of seasonal allergies, denies acid reflux.  There is a strong history of asthma in the family.   Has hospitalized for community-acquired pneumonia at the end of January 2021. He tested negative for COVID 19 and was treated with azithromycin, Augmentin with improvement in symptoms Follow-up chest x-ray showed improvement in pulmonary infiltrates Patient underwent nasal polyp surgery in April 2022 with Dr. Redmond Baseman   Pets: Dogs, no cats, birds Occupation: Works for the [employer].  Works from home Exposures: No known exposures.  No mold at home.  Hardwood, carpet at home.  Forced air heating Smoking history: Never smoker  Travel History: Traveled to [place], [place] recently.  No other significant travel.  Interim History: He has had recurrent exacerbations and attacks of sinusitis.  He was recommended Dupixent and approved in November 2022 but did not start it because of some cost involved and not.  Recently saw Dr. Redmond Baseman, ENT for recurrent sinusitis and started on Bactrim  Reports dyspnea, wheezing  Outpatient Encounter Medications as of 08/30/2021  Medication Sig    albuterol (VENTOLIN HFA) 108 (90 Base) MCG/ACT inhaler Inhale 2 puffs into the lungs every 6 (six) hours as needed for wheezing or shortness of breath.   Ascorbic Acid (VITAMIN C) 1000 MG tablet Take 1,000 mg by mouth daily.   citalopram (CELEXA) 40 MG tablet Take 1 tablet (40 mg total) by mouth daily.   fluticasone furoate-vilanterol (BREO ELLIPTA) 200-25 MCG/INH AEPB USE 1 INHALATION ONCE DAILY   montelukast (SINGULAIR) 10 MG tablet Take 1 tablet (10 mg total) by mouth at bedtime.   Multiple Vitamin (MULTIVITAMIN WITH MINERALS) TABS tablet Take 1 tablet by mouth daily.   predniSONE (DELTASONE) 10 MG tablet Take 4 tabs po daily x 3 days; then 3 tabs daily x3 days; then 2 tabs daily x3 days; then 1 tab daily x 3 days; then stop   simvastatin (ZOCOR) 20 MG tablet Take 1 tablet (20 mg total) by mouth at bedtime.   sulfamethoxazole-trimethoprim (BACTRIM DS) 800-160 MG tablet Take by mouth.   ipratropium-albuterol (DUONEB) 0.5-2.5 (3) MG/3ML SOLN Take 3 mLs by nebulization every 4 (four) hours while awake for 3 days, THEN 3 mLs every 6 (six) hours as needed (shortness of breath).   No facility-administered encounter medications on file as of 08/30/2021.   Physical Exam: Blood pressure 122/64, pulse 82, temperature (!) 100.8 F (38.2 C), temperature source Oral, height 6\' 2"  (1.88 m), weight 195 lb 3.2 oz (88.5 kg), SpO2 99 %. Gen:      No acute distress HEENT:  EOMI, sclera anicteric Neck:     No masses; no thyromegaly Lungs:    Expiratory wheeze CV:  Regular rate and rhythm; no murmurs Abd:      + bowel sounds; soft, non-tender; no palpable masses, no distension Ext:    No edema; adequate peripheral perfusion Skin:      Warm and dry; no rash Neuro: alert and oriented x 3 Psych: normal mood and affect   Data Reviewed: Imaging: Chest x-ray 08/04/2019-bilateral groundglass opacities, consolidation. CTA 08/05/2019-no pulmonary embolism, extensive upper lobe predominant bilateral  consolidation groundglass with air bronchograms.  Mediastinal lymphadenopathy Chest x-ray 09/22/19-provement in lung opacities with streaky residual opacities in the upper lobe. I have reviewed the images personally.  PFTs: 08/27/17 FVC 6.18 [117%], FEV1 5.09 [128%], F/F 82, TLC 112%, DLCO 94% Normal study.  FENO  07/25/17- 54 08/27/2017-85 11/22/2017-99 11/28/2017-157 02/27/2018-55  ACQ 7 02/12/2019- 0.43  ACT score  09/25/2019-23 02/08/2021-11  Labs: CBC 12/14/14-WBC count 6.6, absolute eosinophil count 400  CBC 07/25/17-WBC count 9.1, absolute eosinophil count 500 Blood allergy profile 07/25/17-IgE 58, RAST panel is negative.  Assessment:  Severe persistent asthma Symptoms are consistent with asthma with elevated FENO and CBCs showing eosinophilia. PFTs reviewed which do not show any obstruction.  There is improvement in mid flow rates post albuterol indicating small airways disease  Continues on Breo.   We had discussions regarding biologics.  He is a good candidate for it and was approved for Dupixent due to recurrent exacerbations but he prefers to hold off for now  Get chest x-ray today, prednisone taper Continue inhalers, nebulizers  Plan/Recommendations: - Continue Breo - Prednisone 40 mg a day for 5 days  Follow-up in 3 months [name] MD [facility] 08/30/2021, 11:51 AM  CC: [name], MD

## 2021-08-30 NOTE — Patient Instructions (Signed)
We will get a chest x-ray today Continue the inhalers and nebulizers We will call in prednisone 40 mg a day for 5 days Follow-up in 3 months

## 2021-09-17 ENCOUNTER — Encounter: Payer: Self-pay | Admitting: Family Medicine

## 2021-10-30 DIAGNOSIS — H6983 Other specified disorders of Eustachian tube, bilateral: Secondary | ICD-10-CM | POA: Diagnosis not present

## 2021-10-30 DIAGNOSIS — Z8709 Personal history of other diseases of the respiratory system: Secondary | ICD-10-CM | POA: Diagnosis not present

## 2021-10-30 DIAGNOSIS — R430 Anosmia: Secondary | ICD-10-CM | POA: Diagnosis not present

## 2021-10-30 DIAGNOSIS — Z9622 Myringotomy tube(s) status: Secondary | ICD-10-CM | POA: Diagnosis not present

## 2021-11-03 ENCOUNTER — Other Ambulatory Visit: Payer: Self-pay | Admitting: Family Medicine

## 2021-12-04 ENCOUNTER — Ambulatory Visit: Payer: Medicare Other | Admitting: Pulmonary Disease

## 2021-12-04 DIAGNOSIS — C61 Malignant neoplasm of prostate: Secondary | ICD-10-CM | POA: Diagnosis not present

## 2021-12-05 ENCOUNTER — Ambulatory Visit: Payer: Medicare Other | Admitting: Pulmonary Disease

## 2021-12-05 ENCOUNTER — Encounter: Payer: Self-pay | Admitting: Pulmonary Disease

## 2021-12-05 VITALS — BP 122/68 | HR 58 | Temp 97.8°F | Ht 74.0 in | Wt 187.6 lb

## 2021-12-05 DIAGNOSIS — J4551 Severe persistent asthma with (acute) exacerbation: Secondary | ICD-10-CM | POA: Diagnosis not present

## 2021-12-05 NOTE — Patient Instructions (Signed)
I'm glad you're doing better and improving with your breathing continue inhalers as prescribed follow-up in nine months with either in person or video visit.

## 2021-12-05 NOTE — Progress Notes (Signed)
Edward Lee    [id number]    10-08-1955  Primary Care Physician:Hunter, Brayton Mars, MD  Referring Physician: [name], MD [name],  [place] [address]  Chief complaint:  Follow up for asthma  HPI: 66 year old with history of allergies, hyperlipidemia, irritable bowel syndrome, prostate cancer.  Referred from recurrent exacerbations throughout 2019 Inhalers changed to Breo and Spiriva. He has history of seasonal allergies, denies acid reflux.  There is a strong history of asthma in the family.   Has hospitalized for community-acquired pneumonia at the end of January 2021. He tested negative for COVID 19 and was treated with azithromycin, Augmentin with improvement in symptoms Follow-up chest x-ray showed improvement in pulmonary infiltrates Patient underwent nasal polyp surgery in April 2022 with Dr. Redmond Lee   Pets: Dogs, no cats, birds Occupation: Works for the [employer].  Works from home Exposures: No known exposures.  No mold at home.  Hardwood, carpet at home.  Forced air heating Smoking history: Never smoker  Travel History: Traveled to [place], [place] recently.  No other significant travel.  Interim History: He was seen in February for acute asthma exacerbation. X-ray is normal and was treated with a round of prednisone. He had two subsequent minor episodes since then requiring a couple of days of prednisone  Overall he is back to baseline. States that breathing is doing well. Follow-up with Dr. Redmond Lee for recurrent sinusitis and nasal polyps.  Outpatient Encounter Medications as of 12/05/2021  Medication Sig   albuterol (VENTOLIN HFA) 108 (90 Base) MCG/ACT inhaler Inhale 2 puffs into the lungs every 6 (six) hours as needed for wheezing or shortness of breath.   Ascorbic Acid (VITAMIN C) 1000 MG tablet Take 1,000 mg by mouth daily.   citalopram (CELEXA) 40 MG tablet Take 1 tablet (40 mg total) by mouth daily.   fluticasone  furoate-vilanterol (BREO ELLIPTA) 200-25 MCG/INH AEPB USE 1 INHALATION ONCE DAILY   montelukast (SINGULAIR) 10 MG tablet Take 1 tablet (10 mg total) by mouth at bedtime.   Multiple Vitamin (MULTIVITAMIN WITH MINERALS) TABS tablet Take 1 tablet by mouth daily.   simvastatin (ZOCOR) 20 MG tablet Take 1 tablet (20 mg total) by mouth at bedtime.   ipratropium-albuterol (DUONEB) 0.5-2.5 (3) MG/3ML SOLN Take 3 mLs by nebulization every 4 (four) hours while awake for 3 days, THEN 3 mLs every 6 (six) hours as needed (shortness of breath).   [DISCONTINUED] predniSONE (DELTASONE) 10 MG tablet Take 4 tabs po daily x 3 days; then 3 tabs daily x3 days; then 2 tabs daily x3 days; then 1 tab daily x 3 days; then stop   [DISCONTINUED] predniSONE (DELTASONE) 20 MG tablet Take '40mg'$  for 5 days   No facility-administered encounter medications on file as of 12/05/2021.   Physical Exam: Blood pressure 122/68, pulse (!) 58, temperature 97.8 F (36.6 C), temperature source Oral, height '6\' 2"'$  (1.88 m), weight 187 lb 9.6 oz (85.1 kg), SpO2 98 %. Gen:      No acute distress HEENT:  EOMI, sclera anicteric Neck:     No masses; no thyromegaly Lungs:    Clear to auscultation bilaterally; normal respiratory effort CV:         Regular rate and rhythm; no murmurs Abd:      + bowel sounds; soft, non-tender; no palpable masses, no distension Ext:    No edema; adequate peripheral perfusion Skin:      Warm and dry;  no rash Neuro: alert and oriented x 3 Psych: normal mood and affect   Data Reviewed: Imaging: Chest x-ray 08/04/2019-bilateral groundglass opacities, consolidation. CTA 08/05/2019-no pulmonary embolism, extensive upper lobe predominant bilateral consolidation groundglass with air bronchograms.  Mediastinal lymphadenopathy Chest x-ray 09/22/19-provement in lung opacities with streaky residual opacities in the upper lobe. Chest x-ray 08/30/21 - prominent bronchial markings. No consolidation of pleural effusion. I have  reviewed the images personally.  PFTs: 08/27/17 FVC 6.18 [117%], FEV1 5.09 [128%], F/F 82, TLC 112%, DLCO 94% Normal study.  FENO  07/25/17- 54 08/27/2017-85 11/22/2017-99 11/28/2017-157 02/27/2018-55  ACQ 7 02/12/2019- 0.43  ACT score  09/25/2019-23 02/08/2021-11  Labs: CBC 12/14/14-WBC count 6.6, absolute eosinophil count 400  CBC 07/25/17-WBC count 9.1, absolute eosinophil count 500 Blood allergy profile 07/25/17-IgE 58, RAST panel is negative.  Assessment:  Severe persistent asthma Symptoms are consistent with asthma with elevated FENO and CBCs showing eosinophilia. PFTs reviewed which do not show any obstruction.  There is improvement in mid flow rates post albuterol indicating small airways disease  Continues on Breo and Edward Lee. He was previously on Spiriva but self discontinued We had repeated discussions regarding biologics which would be a good option for him due to recurrent exacerbations. He was approved for Dupixent due to recurrent exacerbations but he prefers to hold off for now  Plan/Recommendations: - Continue Breo, singular  Follow-up in 9 months  Edward Garfinkel MD [facility] 12/05/2021, 11:32 AM  CC: Edward Olp, MD

## 2021-12-14 DIAGNOSIS — C61 Malignant neoplasm of prostate: Secondary | ICD-10-CM | POA: Diagnosis not present

## 2022-01-19 DIAGNOSIS — C61 Malignant neoplasm of prostate: Secondary | ICD-10-CM | POA: Diagnosis not present

## 2022-01-19 DIAGNOSIS — D075 Carcinoma in situ of prostate: Secondary | ICD-10-CM | POA: Diagnosis not present

## 2022-01-25 DIAGNOSIS — H524 Presbyopia: Secondary | ICD-10-CM | POA: Diagnosis not present

## 2022-01-25 DIAGNOSIS — Z961 Presence of intraocular lens: Secondary | ICD-10-CM | POA: Diagnosis not present

## 2022-02-01 ENCOUNTER — Other Ambulatory Visit: Payer: Self-pay | Admitting: Primary Care

## 2022-02-06 ENCOUNTER — Other Ambulatory Visit: Payer: Self-pay | Admitting: Primary Care

## 2022-02-09 ENCOUNTER — Telehealth: Payer: Self-pay | Admitting: Primary Care

## 2022-02-09 NOTE — Telephone Encounter (Signed)
Called and the pharmacy is trying to verify why prednisone was being denied. Does patient need to be on continuous prednisone? LOV was 11/2021.  Please advise

## 2022-02-12 ENCOUNTER — Encounter: Payer: Self-pay | Admitting: Pulmonary Disease

## 2022-02-12 MED ORDER — PREDNISONE 10 MG PO TABS: ORAL_TABLET | ORAL | 1 refills | Status: AC

## 2022-02-12 NOTE — Telephone Encounter (Signed)
I Havent seen pat since October, please send to Dr. Vaughan Browner

## 2022-02-12 NOTE — Telephone Encounter (Signed)
I am ok with that. Please apologize to Edward Lee, I did not fill RX because I had not seen him in several months so I preferred Dr. Vaughan Browner do that since he saw him most recently. I am happy to send in. I will give him one refill. If needing to use prednisone more than this please schedule visit with [place].

## 2022-02-12 NOTE — Telephone Encounter (Signed)
Please advise 

## 2022-02-12 NOTE — Telephone Encounter (Signed)
Called and the pharmacy is trying to verify why prednisone was being denied. Does patient need to be on continuous prednisone? LOV was 11/2021.   Please advise

## 2022-02-16 NOTE — Telephone Encounter (Signed)
He does not need to be on continuous prednisone.  Previous prednisone order was for taper when he had an exacerbation.  No need to renew the prednisone

## 2022-02-16 NOTE — Telephone Encounter (Signed)
Called pt's pharmacy stating to them that the prednisone Rx can be cancelled as we are not renewing Rx. Understanding was verbalized. Nothing further needed.

## 2022-02-23 ENCOUNTER — Ambulatory Visit (INDEPENDENT_AMBULATORY_CARE_PROVIDER_SITE_OTHER): Payer: Medicare Other | Admitting: Family Medicine

## 2022-02-23 ENCOUNTER — Telehealth: Payer: Self-pay | Admitting: Family Medicine

## 2022-02-23 ENCOUNTER — Encounter: Payer: Self-pay | Admitting: Family Medicine

## 2022-02-23 VITALS — BP 119/76 | HR 63 | Temp 98.4°F | Ht 74.0 in | Wt 187.4 lb

## 2022-02-23 DIAGNOSIS — R059 Cough, unspecified: Secondary | ICD-10-CM

## 2022-02-23 DIAGNOSIS — J4551 Severe persistent asthma with (acute) exacerbation: Secondary | ICD-10-CM

## 2022-02-23 LAB — POC COVID19 BINAXNOW: SARS Coronavirus 2 Ag: NEGATIVE

## 2022-02-23 MED ORDER — PREDNISONE 20 MG PO TABS: ORAL_TABLET | ORAL | 0 refills | Status: DC

## 2022-02-23 NOTE — Telephone Encounter (Signed)
See below

## 2022-02-23 NOTE — Telephone Encounter (Signed)
I could see him today at 420-and you can test for COVID or he can test at home before coming in and if positive we can do a virtual visit if he would like

## 2022-02-23 NOTE — Telephone Encounter (Signed)
Patient given message below, has agreed to come in today at 4:20 pm per Dr. Yong Channel.

## 2022-02-23 NOTE — Patient Instructions (Addendum)
Asthma flare up -treat with prednisone course -if not improving within a week consider chest x-ray or with pulmonology follow up  - continue albuterol as needed as well as maintenance treatments  Recommended follow up: Return in about 6 months (around 08/26/2022) for physical or sooner if needed.Schedule b4 you leave. -but also as needed for new, worsening, or persistent symptoms from acute illness

## 2022-02-23 NOTE — Telephone Encounter (Signed)
Patient states: -He is experiencing cough,chest congestion, chest pressure (due to cough), and fatigue.  - He has not tested for COVID  Patient requests: - An OV with his PCP. States it can be next week if he can get worked in.  I offered patient an OV with another provider due to PCP not being available until 08/22 but he declined. After informing patient I could ask PCP if a work in is possible, I offered an OV with another provider as a back up. Patient agreed and has been scheduled with Dr. Jerline Pain on 08/15 '@1pm'$ .   Despite this he wants to know if he can be worked in with PCP next week. Please Advise.

## 2022-02-23 NOTE — Progress Notes (Signed)
Phone [phone] In person visit   Subjective:   Edward Lee is a 66 y.o. year old very pleasant male patient who presents for/with See problem oriented charting Chief Complaint  Patient presents with   Cough    Productive cough that started 1 week ago Taking Robitussin DM at night to help with sleep   Chest Congestion   Shortness of Breath    Past Medical History-  Patient Active Problem List   Diagnosis Date Noted   Multifocal pneumonia 08/05/2019    Priority: High   Severe persistent asthma with (acute) exacerbation 11/17/2018    Priority: High   Malignant neoplasm of prostate ([place]) 04/26/2015    Priority: High   Anosmia 08/16/2020    Priority: Medium    Aortic atherosclerosis ([place]) 08/10/2019    Priority: Medium    Coronary artery calcification 08/10/2019    Priority: Medium    Tinea cruris 08/05/2019    Priority: Medium    Chronic low back pain 01/10/2017    Priority: Medium    Urinary frequency 11/28/2011    Priority: Medium    Hyperlipidemia 03/21/2011    Priority: Medium    PTSD (post-traumatic stress disorder) 09/06/2010    Priority: Medium    Recurrent maxillary sinusitis 11/17/2018    Priority: Low   Other allergic rhinitis 11/17/2018    Priority: Low   IBS (irritable bowel syndrome)     Priority: Low   Asthma exacerbation 04/17/2021    Medications- reviewed and updated Current Outpatient Medications  Medication Sig Dispense Refill   albuterol (VENTOLIN HFA) 108 (90 Base) MCG/ACT inhaler Inhale 2 puffs into the lungs every 6 (six) hours as needed for wheezing or shortness of breath. 54 g 3   Ascorbic Acid (VITAMIN C) 1000 MG tablet Take 1,000 mg by mouth daily.     citalopram (CELEXA) 40 MG tablet Take 1 tablet (40 mg total) by mouth daily. 90 tablet 2   fluticasone furoate-vilanterol (BREO ELLIPTA) 200-25 MCG/INH AEPB USE 1 INHALATION ONCE DAILY 60 each 11   montelukast (SINGULAIR) 10 MG tablet Take 1 tablet (10 mg total) by mouth at bedtime.  30 tablet 11   Multiple Vitamin (MULTIVITAMIN WITH MINERALS) TABS tablet Take 1 tablet by mouth daily.     predniSONE (DELTASONE) 20 MG tablet Take 2 pills for 3 days, 1 pill for 4 days 10 tablet 0   simvastatin (ZOCOR) 20 MG tablet Take 1 tablet (20 mg total) by mouth at bedtime. 90 tablet 2   ipratropium-albuterol (DUONEB) 0.5-2.5 (3) MG/3ML SOLN Take 3 mLs by nebulization every 4 (four) hours while awake for 3 days, THEN 3 mLs every 6 (six) hours as needed (shortness of breath).     No current facility-administered medications for this visit.     Objective:  BP 119/76   Pulse 63   Temp 98.4 F (36.9 C) (Temporal)   Ht '6\' 2"'$  (1.88 m)   Wt 187 lb 6 oz (85 kg)   SpO2 94%   BMI 24.06 kg/m  Gen: NAD, resting comfortably Tympanostomy tubes present bilaterally-tympanic membrane otherwise normal.  Oropharynx-pharynx with mild erythema and slight drainage.  No sinus tenderness.  Mild nasal turbinate edema CV: RRR no murmurs rubs or gallops Lungs: Diffuse wheeze-did have slight rhonchi but after cough resolved Abdomen: soft/nontender/nondistended/normal bowel sounds. No rebound or guarding.  Ext: Minimal edema-varicose veins on both legs-reports chronic.  Spider veins on left upper leg Skin: warm, dry  Results for orders placed or performed in  visit on 02/23/22 (from the past 24 hour(s))  POC COVID-19     Status: Normal   Collection Time: 02/23/22  4:35 PM  Result Value Ref Range   SARS Coronavirus 2 Ag Negative Negative      Assessment and Plan   #Severe persistent Asthma S: Patient with cough and congestion including chest congestion for the last week.  Compliant with baseline Breo once daily. More short of breath than usual and wheezing with it- comes and goes. No chest pain. Brown sputum production. No nasal congestion or sinus pressure. Also taking singulair. Baseline toruble sleeping but overall fatigue is worse with illness - has been able to walk his 5 miles but feels more  tired when done- wiped out..  -tried to exercise yesterday but felt more winded- some coughing fits as well - albuterol about twice a day - robitussin DM does at least help him sleep - history of pneumonia- does not feel as bad as he has in the past with this -has not needed nebulizer - no sick contacts. No clear triggers A/P: Asthma (severe persistent)-with poor control/acute exacerbation-second within 6 months and encouraged him to consider follow-up with pulmonology-may need more advanced therapy/adjustment and maintenance medications.  For now he will continue current maintenance medications - No obvious sign of pneumonia on exam or in history such as fever-we opted to treat with prednisone to calm down acute exacerbation-possible viral trigger though no obvious contact with someone ill -COVID-negative as above in 7 days into course do not think he needs repeat testing  -Follow-up for new or worsening symptoms -Was shortness of breath likely related to asthma-no chest pain-doubt cardiac disease -Symptoms consistent with prior asthma-doubt DVT or PE-no calf pain or swelling   Recommended follow up: Return in about 6 months (around 08/26/2022) for physical or sooner if needed.Schedule b4 you leave.  Lab/Order associations:   ICD-10-CM   1. Cough, unspecified type  R05.9 POC COVID-19      Meds ordered this encounter  Medications   predniSONE (DELTASONE) 20 MG tablet    Sig: Take 2 pills for 3 days, 1 pill for 4 days    Dispense:  10 tablet    Refill:  0    Return precautions advised.  [name], MD

## 2022-02-26 DIAGNOSIS — L821 Other seborrheic keratosis: Secondary | ICD-10-CM | POA: Diagnosis not present

## 2022-02-26 DIAGNOSIS — L918 Other hypertrophic disorders of the skin: Secondary | ICD-10-CM | POA: Diagnosis not present

## 2022-02-26 DIAGNOSIS — I8393 Asymptomatic varicose veins of bilateral lower extremities: Secondary | ICD-10-CM | POA: Diagnosis not present

## 2022-02-27 ENCOUNTER — Ambulatory Visit: Payer: Medicare Other | Admitting: Family Medicine

## 2022-03-21 ENCOUNTER — Telehealth: Payer: Self-pay | Admitting: *Deleted

## 2022-03-21 NOTE — Patient Outreach (Signed)
  Care Coordination   03/21/2022 Name: ZAYVION STAILEY MRN: [id number] DOB: 1956/04/03   Care Coordination Outreach Attempts:  An unsuccessful telephone outreach was attempted today to offer the patient information about available care coordination services as a benefit of their health plan.   Follow Up Plan:  Additional outreach attempts will be made to offer the patient care coordination information and services.   Encounter Outcome:  No Answer  Care Coordination Interventions Activated:  No   Care Coordination Interventions:  No, not indicated    Raina Mina, RN Care Management Coordinator [place] Office [phone]

## 2022-03-23 ENCOUNTER — Other Ambulatory Visit: Payer: Self-pay | Admitting: Pulmonary Disease

## 2022-03-29 ENCOUNTER — Other Ambulatory Visit: Payer: Self-pay

## 2022-03-29 ENCOUNTER — Encounter: Payer: Self-pay | Admitting: Family Medicine

## 2022-03-29 MED ORDER — PREDNISONE 20 MG PO TABS: ORAL_TABLET | ORAL | 0 refills | Status: DC

## 2022-04-12 ENCOUNTER — Telehealth: Payer: Self-pay | Admitting: *Deleted

## 2022-04-12 ENCOUNTER — Telehealth: Payer: Self-pay | Admitting: Family Medicine

## 2022-04-12 NOTE — Telephone Encounter (Signed)
Copied from [place] [id number]. Topic: Medicare AWV >> Apr 12, 2022 10:25 AM Devoria Glassing wrote: Reason for CRM: Left message for patient to schedule Annual Wellness Visit.  Please schedule with Nurse Health Advisor Charlott Rakes, RN at [facility]. This appt can be telephone or office visit. Please call [phone] ask for [facility]

## 2022-04-12 NOTE — Patient Outreach (Signed)
  Care Coordination   04/12/2022 Name: Edward Lee MRN: [id number] DOB: [birth year]   Care Coordination Outreach Attempts:  A second unsuccessful outreach was attempted today to offer the patient with information about available care coordination services as a benefit of their health plan.     Follow Up Plan:  Additional outreach attempts will be made to offer the patient care coordination information and services.   Encounter Outcome:  No Answer  Care Coordination Interventions Activated:  No   Care Coordination Interventions:  No, not indicated    Raina Mina, RN Care Management Coordinator [place] Office [phone]

## 2022-04-19 ENCOUNTER — Telehealth: Payer: Self-pay

## 2022-04-19 NOTE — Patient Outreach (Signed)
  Care Coordination   04/19/2022 Name: Edward Lee MRN: [id number] DOB: 1956-05-01   Care Coordination Outreach Attempts:  A third unsuccessful outreach was attempted today to offer the patient with information about available care coordination services as a benefit of their health plan.   Follow Up Plan:  No further outreach attempts will be made at this time. We have been unable to contact the patient to offer or enroll patient in care coordination services  Encounter Outcome:  No Answer  Care Coordination Interventions Activated:  No   Care Coordination Interventions:  No, not indicated    Peter Garter RN, BSN,CCM, [facility] [facility] [phone]

## 2022-04-20 ENCOUNTER — Encounter: Payer: Self-pay | Admitting: Family Medicine

## 2022-04-20 ENCOUNTER — Ambulatory Visit (INDEPENDENT_AMBULATORY_CARE_PROVIDER_SITE_OTHER): Payer: Medicare Other | Admitting: Family Medicine

## 2022-04-20 VITALS — BP 108/64 | HR 73 | Temp 98.2°F | Ht 74.0 in | Wt 191.2 lb

## 2022-04-20 DIAGNOSIS — R059 Cough, unspecified: Secondary | ICD-10-CM | POA: Diagnosis not present

## 2022-04-20 DIAGNOSIS — J329 Chronic sinusitis, unspecified: Secondary | ICD-10-CM | POA: Diagnosis not present

## 2022-04-20 DIAGNOSIS — B9689 Other specified bacterial agents as the cause of diseases classified elsewhere: Secondary | ICD-10-CM

## 2022-04-20 DIAGNOSIS — J4550 Severe persistent asthma, uncomplicated: Secondary | ICD-10-CM | POA: Diagnosis not present

## 2022-04-20 LAB — POC COVID19 BINAXNOW: SARS Coronavirus 2 Ag: NEGATIVE

## 2022-04-20 MED ORDER — DOXYCYCLINE HYCLATE 100 MG PO TABS: 100.0000 mg | ORAL_TABLET | Freq: Two times a day (BID) | ORAL | 0 refills | Status: DC

## 2022-04-20 NOTE — Patient Instructions (Addendum)
Try doxycycline for sinusitis since you did not tolerate augmentin most recently in march or so.   If fail to improve we could retrial augmentin and just make sure to have good amount of food on belly before trying  If no better in 10 days please let us know   We can do an x-ray later if needed but hopefully will not be  Recommended follow up: Return for as needed for new, worsening, persistent symptoms.

## 2022-04-20 NOTE — Progress Notes (Signed)
Phone [phone] In person visit   Subjective:   Edward Lee is a 66 y.o. year old very pleasant male patient who presents for/with See problem oriented charting Chief Complaint  Patient presents with   Cough    Productive cough, yellow mucous x3wks   sinus congestion    Nasal and chest lingering after cold denies fever has body aches   Past Medical History-  Patient Active Problem List   Diagnosis Date Noted   Multifocal pneumonia 08/05/2019    Priority: High   Severe persistent asthma with (acute) exacerbation 11/17/2018    Priority: High   Malignant neoplasm of prostate ([place]) 04/26/2015    Priority: High   Anosmia 08/16/2020    Priority: Medium    Aortic atherosclerosis ([place]) 08/10/2019    Priority: Medium    Coronary artery calcification 08/10/2019    Priority: Medium    Tinea cruris 08/05/2019    Priority: Medium    Chronic low back pain 01/10/2017    Priority: Medium    Urinary frequency 11/28/2011    Priority: Medium    Hyperlipidemia 03/21/2011    Priority: Medium    PTSD (post-traumatic stress disorder) 09/06/2010    Priority: Medium    Recurrent maxillary sinusitis 11/17/2018    Priority: Low   Other allergic rhinitis 11/17/2018    Priority: Low   IBS (irritable bowel syndrome)     Priority: Low   Asthma exacerbation 04/17/2021    Medications- reviewed and updated Current Outpatient Medications  Medication Sig Dispense Refill   albuterol (VENTOLIN HFA) 108 (90 Base) MCG/ACT inhaler Inhale 2 puffs into the lungs every 6 (six) hours as needed for wheezing or shortness of breath. 54 g 3   Ascorbic Acid (VITAMIN C) 1000 MG tablet Take 1,000 mg by mouth daily.     citalopram (CELEXA) 40 MG tablet Take 1 tablet (40 mg total) by mouth daily. 90 tablet 2   doxycycline (VIBRA-TABS) 100 MG tablet Take 1 tablet (100 mg total) by mouth 2 (two) times daily for 7 days. 14 tablet 0   fluticasone furoate-vilanterol (BREO ELLIPTA) 200-25 MCG/INH AEPB USE 1  INHALATION ONCE DAILY 60 each 11   montelukast (SINGULAIR) 10 MG tablet Take 1 tablet (10 mg total) by mouth at bedtime. 30 tablet 11   Multiple Vitamin (MULTIVITAMIN WITH MINERALS) TABS tablet Take 1 tablet by mouth daily.     simvastatin (ZOCOR) 20 MG tablet Take 1 tablet (20 mg total) by mouth at bedtime. 90 tablet 2   ipratropium-albuterol (DUONEB) 0.5-2.5 (3) MG/3ML SOLN Take 3 mLs by nebulization every 4 (four) hours while awake for 3 days, THEN 3 mLs every 6 (six) hours as needed (shortness of breath).     No current facility-administered medications for this visit.     Objective:  BP 108/64   Pulse 73   Temp 98.2 F (36.8 C)   Ht '6\' 2"'$  (1.88 m)   Wt 191 lb 3.2 oz (86.7 kg)   SpO2 94%   BMI 24.55 kg/m  Gen: NAD, resting comfortably Nasal turbinates erythematous and swollen.  Bilateral tympanostomy tube present and eardrum normal otherwise.  Pharynx mildly erythematous with some drainage noted CV: RRR no murmurs rubs or gallops Lungs: CTAB no crackles, wheeze, rhonchi Ext: no edema Skin: warm, dry  Results for orders placed or performed in visit on 04/20/22 (from the past 24 hour(s))  POC COVID-19     Status: None   Collection Time: 04/20/22  4:36 PM  Result  Value Ref Range   SARS Coronavirus 2 Ag Negative Negative       Assessment and Plan   # Nasal congestion/discharge/cough S:cough productive for yellow mucus x 3 weeks associated with nasal congestion. Also feels potentially some chest congestion after a cold. No fever or body aches. Coughing fits with deep breath. Feels weak . Just got back from vacation- travel [place].  Has tried ibuprofen from the pain/discomfort from coughing as well as Robitussin-DM with minimal relief   -prior to cold asthma was bothering him some - but honestly better recently. Prior to that in august we treated with prednisone for asthma and that cleared  -not wheezing or short of breath.  A/P: 3 weeks of nasal congestion/discharge-also  with drainage seeming to cause a cough-potential bacterial sinusitis-treat with 7 days of doxycycline (would also cover walking pneumonia) - Considered Augmentin but had stomach upset on this most recently when trialed  #Severe persistent asthma S: Medication: Breo 200-25 mcg daily, Singulair 10 mg - Has albuterol available if needed - Patient denies significant asthma symptoms such as wheezing or shortness of breath A/P:  Controlled-thankfully not being irritated by current illness. Continue current medications.      Recommended follow up: Return for as needed for new, worsening, persistent symptoms. Future Appointments  Date Time Provider [place]  09/18/2022  1:00 PM [name], MD [facility]    Lab/Order associations:   ICD-10-CM   1. Bacterial sinusitis  J32.9    B96.89     2. Severe persistent asthma without complication  Y72.15     3. Cough, unspecified type  R05.9 POC COVID-19      Meds ordered this encounter  Medications   doxycycline (VIBRA-TABS) 100 MG tablet    Sig: Take 1 tablet (100 mg total) by mouth 2 (two) times daily for 7 days.    Dispense:  14 tablet    Refill:  0    Return precautions advised.  [name], MD

## 2022-05-02 ENCOUNTER — Other Ambulatory Visit: Payer: Self-pay | Admitting: Pulmonary Disease

## 2022-05-28 ENCOUNTER — Encounter: Payer: Self-pay | Admitting: Pulmonary Disease

## 2022-05-28 ENCOUNTER — Other Ambulatory Visit: Payer: Self-pay | Admitting: Pulmonary Disease

## 2022-05-28 ENCOUNTER — Ambulatory Visit: Payer: Medicare Other | Admitting: Pulmonary Disease

## 2022-05-28 VITALS — BP 120/70 | HR 64 | Ht 74.0 in | Wt 188.4 lb

## 2022-05-28 DIAGNOSIS — J4551 Severe persistent asthma with (acute) exacerbation: Secondary | ICD-10-CM | POA: Diagnosis not present

## 2022-05-28 MED ORDER — PREDNISONE 10 MG PO TABS: 10.0000 mg | ORAL_TABLET | Freq: Every day | ORAL | 0 refills | Status: DC

## 2022-05-28 NOTE — Patient Instructions (Signed)
I am glad stable with your breathing I have sent in a prescription for prednisone Continue inhaler and Singulair Follow-up in 6 months.
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
## 2022-06-07 ENCOUNTER — Encounter: Payer: Self-pay | Admitting: Pulmonary Disease

## 2022-06-27 DIAGNOSIS — J3489 Other specified disorders of nose and nasal sinuses: Secondary | ICD-10-CM | POA: Diagnosis not present

## 2022-06-27 DIAGNOSIS — Z9622 Myringotomy tube(s) status: Secondary | ICD-10-CM | POA: Diagnosis not present

## 2022-06-27 DIAGNOSIS — Z9889 Other specified postprocedural states: Secondary | ICD-10-CM | POA: Diagnosis not present

## 2022-06-27 DIAGNOSIS — K219 Gastro-esophageal reflux disease without esophagitis: Secondary | ICD-10-CM | POA: Diagnosis not present

## 2022-06-28 ENCOUNTER — Encounter: Payer: Self-pay | Admitting: *Deleted

## 2022-08-01 DIAGNOSIS — C61 Malignant neoplasm of prostate: Secondary | ICD-10-CM | POA: Diagnosis not present

## 2022-08-06 ENCOUNTER — Encounter: Payer: Self-pay | Admitting: Family Medicine

## 2022-08-09 ENCOUNTER — Other Ambulatory Visit: Payer: Self-pay | Admitting: Family Medicine

## 2022-08-16 ENCOUNTER — Ambulatory Visit (INDEPENDENT_AMBULATORY_CARE_PROVIDER_SITE_OTHER): Payer: Medicare Other | Admitting: Family Medicine

## 2022-08-16 ENCOUNTER — Encounter: Payer: Self-pay | Admitting: Family Medicine

## 2022-08-16 VITALS — BP 121/75 | HR 60 | Temp 98.7°F | Ht 74.0 in | Wt 189.4 lb

## 2022-08-16 DIAGNOSIS — F4310 Post-traumatic stress disorder, unspecified: Secondary | ICD-10-CM | POA: Diagnosis not present

## 2022-08-16 DIAGNOSIS — R351 Nocturia: Secondary | ICD-10-CM

## 2022-08-16 DIAGNOSIS — Z79899 Other long term (current) drug therapy: Secondary | ICD-10-CM

## 2022-08-16 DIAGNOSIS — E785 Hyperlipidemia, unspecified: Secondary | ICD-10-CM

## 2022-08-16 MED ORDER — DOXYCYCLINE HYCLATE 100 MG PO TABS: 100.0000 mg | ORAL_TABLET | Freq: Two times a day (BID) | ORAL | 0 refills | Status: AC

## 2022-08-16 NOTE — Patient Instructions (Addendum)
Has plenty of 40 mg at home- he is going to cut the citalopram in half to 20 mg and we opted a slower wean so suggested 6 months of 20 mg dose and if still doing very well (which we anticipate)I can send in 10 mg dose which he would take in the 3-6 month range, then half a tablet of 10 mg for 1 week then stop  Recommended follow up: on march 5th- move your appointment to the 2 pm slot so you can be back to back with your son  Schedule labs a few days before your physical- I ordered these today

## 2022-08-16 NOTE — Progress Notes (Signed)
Phone [phone] In person visit   Subjective:   Edward Lee is a 67 y.o. year old very pleasant male patient who presents for/with See problem oriented charting Chief Complaint  Patient presents with   DISCUSS MEDS    Pt has no questions or concerns     Past Medical History-  Patient Active Problem List   Diagnosis Date Noted   Multifocal pneumonia 08/05/2019    Priority: High   Severe persistent asthma with (acute) exacerbation 11/17/2018    Priority: High   Malignant neoplasm of prostate ([place]) 04/26/2015    Priority: High   Anosmia 08/16/2020    Priority: Medium    Aortic atherosclerosis ([place]) 08/10/2019    Priority: Medium    Coronary artery calcification 08/10/2019    Priority: Medium    Tinea cruris 08/05/2019    Priority: Medium    Chronic low back pain 01/10/2017    Priority: Medium    Urinary frequency 11/28/2011    Priority: Medium    Hyperlipidemia 03/21/2011    Priority: Medium    PTSD (post-traumatic stress disorder) 09/06/2010    Priority: Medium    Recurrent maxillary sinusitis 11/17/2018    Priority: Low   Other allergic rhinitis 11/17/2018    Priority: Low   IBS (irritable bowel syndrome)     Priority: Low   Asthma exacerbation 04/17/2021    Medications- reviewed and updated Current Outpatient Medications  Medication Sig Dispense Refill   albuterol (VENTOLIN HFA) 108 (90 Base) MCG/ACT inhaler USE 2 PUFFS EVERY 6 HOURS AS NEEDED FOR SHORTNESS OF BREATH AND WHEEZING. 18 g 2   Ascorbic Acid (VITAMIN C) 1000 MG tablet Take 1,000 mg by mouth daily.     budesonide (PULMICORT) 0.5 MG/2ML nebulizer solution Use half of saline irrigation through the nose. Then, mix respule in remainder and irrigate with that volume. Repeat twice daily.     citalopram (CELEXA) 40 MG tablet Take 1 tablet (40 mg total) by mouth daily. 90 tablet 2   fluticasone furoate-vilanterol (BREO ELLIPTA) 200-25 MCG/ACT AEPB USE 1 INHALATION ONCE DAILY 60 each 5   montelukast  (SINGULAIR) 10 MG tablet Take 1 tablet (10 mg total) by mouth at bedtime. 30 tablet 11   Multiple Vitamin (MULTIVITAMIN WITH MINERALS) TABS tablet Take 1 tablet by mouth daily.     omeprazole (PRILOSEC) 20 MG capsule Take 20 mg by mouth 2 (two) times daily.     simvastatin (ZOCOR) 20 MG tablet Take 1 tablet (20 mg total) by mouth at bedtime. 90 tablet 2   doxycycline (VIBRA-TABS) 100 MG tablet Take 1 tablet (100 mg total) by mouth 2 (two) times daily for 7 days. 14 tablet 0   No current facility-administered medications for this visit.     Objective:  BP 121/75 (BP Location: Right Arm, Patient Position: Sitting)   Pulse 60   Temp 98.7 F (37.1 C) (Temporal)   Ht '6\' 2"'$  (1.88 m)   Wt 189 lb 6.4 oz (85.9 kg)   SpO2 94%   BMI 24.32 kg/m  Gen: NAD, resting comfortably, well-appearing and appears younger than stated age    Assessment and Plan   #social update- [place] cruise coming up in a few weeks. Last September had a bad sinus infection on the cruise- went to [facility] and they wouldn't treat them.  We went over indications for treatment including double sickening and symptoms over 10 days.  He also has history of pneumonia doxycycline 1 provide some coverage if  he is not able to quickly access medical care  #PTSD  S: Medication: citalopram 40 mg - wants to wean off -prior anxiety issues have improved, last did counseling in 2016, no flashbacks to prior history.     08/16/2022   11:26 AM 02/23/2022    4:33 PM 08/16/2020    3:47 PM  Depression screen PHQ 2/9  Decreased Interest 0 0 0  Down, Depressed, Hopeless 0 0 0  PHQ - 2 Score 0 0 0  Altered sleeping 0    Tired, decreased energy 0    Change in appetite 0    Feeling bad or failure about yourself  0    Trouble concentrating 0    Moving slowly or fidgety/restless 0    Suicidal thoughts 0    PHQ-9 Score 0    Difficult doing work/chores Not difficult at all    A/P: PTSD well controlled without medicine. Has plenty of 40 mg  at home- he is going to cut the citalopram in half to 20 mg and we opted a slower wean so suggested 6 months of 20 mg dose and if still doing very well (which we anticipate)I can send in 10 mg dose which he would take in the 3-6 month range, then half a tablet of 10 mg for 1 week then stop  #hyperlipidemia #aortic atherosclerosis S: Medication:simvastatin 20 mg  Lab Results  Component Value Date   CHOL 182 05/24/2020   HDL 89 05/24/2020   LDLCALC 78 05/24/2020   TRIG 71 05/24/2020   CHOLHDL 2.0 05/24/2020   A/P: Patient is overdue for lipid panel but has upcoming physical-we went ahead and ordered lipids today-continue current medication for now.  Also check CBC and CMP with labs  # Prostate cancer-remains under active surveillance with [facility]-he declines PSA with labs as will be monitored with urology and recently done in January with Dr. Lovena Neighbours  #GERD- asthma has done much better with treating with prilosec 20 mg BID- will check b12 level with long term use.   Recommended follow up: Will come by for labs a few days before his visit for physical in March Future Appointments  Date Time Provider [place]  09/13/2022  8:45 AM [facility]  09/18/2022  2:00 PM [name], MD [facility]    Lab/Order associations:   ICD-10-CM   1. Nocturia  R35.1 CANCELED: PSA    2. Hyperlipidemia, unspecified hyperlipidemia type  E78.5 CBC with Differential/Platelet    Comprehensive metabolic panel    Lipid panel    3. High risk medication use  Z79.899 Vitamin B12    4. PTSD (post-traumatic stress disorder)  F43.10       Meds ordered this encounter  Medications   doxycycline (VIBRA-TABS) 100 MG tablet    Sig: Take 1 tablet (100 mg total) by mouth 2 (two) times daily for 7 days.    Dispense:  14 tablet    Refill:  0    Return precautions advised.  [name], MD

## 2022-08-29 ENCOUNTER — Encounter: Payer: Self-pay | Admitting: Family Medicine

## 2022-08-29 ENCOUNTER — Ambulatory Visit (INDEPENDENT_AMBULATORY_CARE_PROVIDER_SITE_OTHER): Payer: Medicare Other | Admitting: Family Medicine

## 2022-08-29 VITALS — BP 111/68 | HR 73 | Temp 97.8°F | Ht 74.0 in | Wt 188.8 lb

## 2022-08-29 DIAGNOSIS — H938X1 Other specified disorders of right ear: Secondary | ICD-10-CM | POA: Diagnosis not present

## 2022-08-29 DIAGNOSIS — M26609 Unspecified temporomandibular joint disorder, unspecified side: Secondary | ICD-10-CM | POA: Diagnosis not present

## 2022-08-29 NOTE — Progress Notes (Signed)
   Edward Lee is a 67 y.o. male who presents today for an office visit.  Assessment/Plan:  Ear Pressure Ear exam today shows normal TM with tympanostomy tube in place.  No signs of otitis media or otitis externa.  He does have quite a bit of palpable click on palpation of TMJ joint which may explain some of his symptoms.  He also does have a history of degenerative disc disease in his neck which is likely contributing some to the clicking sensation that he is hearing with movement. Reassured patient that he has no signs of infection today.  Recommend that he continue taking NSAIDs as needed for his arthritis.  We discussed reasons to return to care.  He will let us know if not improving. He can follow up here or with ENT.     Subjective:  HPI:  Patient here with right ear pressure. He is concerned about ear infection. This started about a week ago. Was having other URI symptoms including cough, congestion, runny nose about a week before this.  All his other URI symptoms have resolved.  Still has some sensitivity to his right ear.  Some cracking with certain head motions.  No ear drainage.  No pain. Has tympanostomy tubes in place for the past year. Tried allegra d which has not helped. No fevers or chills. Hearing is normal.        Objective:  Physical Exam: BP 111/68   Pulse 73   Temp 97.8 F (36.6 C) (Temporal)   Ht '6\' 2"'$  (1.88 m)   Wt 188 lb 12.8 oz (85.6 kg)   SpO2 99%   BMI 24.24 kg/m   Gen: No acute distress, resting comfortably HEENT: Tympanostomy tubes in place bilaterally.  No erythema.  No drainage.  Hearing grossly intact.  Right TMJ joint with palpable click on opening and lateral extension of jaw. CV: Regular rate and rhythm with no murmurs appreciated Pulm: Normal work of breathing, clear to auscultation bilaterally with no crackles, wheezes, or rhonchi Neuro: Grossly normal, moves all extremities Psych: Normal affect and thought content      Mitsuko Luera M. Jerline Pain,  MD 08/29/2022 9:21 AM

## 2022-08-29 NOTE — Patient Instructions (Signed)
It was very nice to see you today!  You do not have any signs of an ear infection today.  It is possible that you could be getting referred sounds from your neck or TMJ.  Please let us know if your symptoms do not improve or if any new symptoms arise.  Take care, Dr Jerline Pain  PLEASE NOTE:  If you had any lab tests, please let us know if you have not heard back within a few days. You may see your results on mychart before we have a chance to review them but we will give you a call once they are reviewed by [place].   If we ordered any referrals today, please let us know if you have not heard from their office within the next week.   If you had any urgent prescriptions sent in today, please check with the pharmacy within an hour of our visit to make sure the prescription was transmitted appropriately.   Please try these tips to maintain a healthy lifestyle:  Eat at least 3 REAL meals and 1-2 snacks per day.  Aim for no more than 5 hours between eating.  If you eat breakfast, please do so within one hour of getting up.   Each meal should contain half fruits/vegetables, one quarter protein, and one quarter carbs (no bigger than a computer mouse)  Cut down on sweet beverages. This includes juice, soda, and sweet tea.   Drink at least 1 glass of water with each meal and aim for at least 8 glasses per day  Exercise at least 150 minutes every week.

## 2022-09-13 ENCOUNTER — Other Ambulatory Visit (INDEPENDENT_AMBULATORY_CARE_PROVIDER_SITE_OTHER): Payer: Medicare Other

## 2022-09-13 DIAGNOSIS — Z79899 Other long term (current) drug therapy: Secondary | ICD-10-CM | POA: Diagnosis not present

## 2022-09-13 DIAGNOSIS — E785 Hyperlipidemia, unspecified: Secondary | ICD-10-CM

## 2022-09-13 LAB — COMPREHENSIVE METABOLIC PANEL
ALT: 15 U/L (ref 0–53)
AST: 23 U/L (ref 0–37)
Albumin: 3.8 g/dL (ref 3.5–5.2)
Alkaline Phosphatase: 42 U/L (ref 39–117)
BUN: 28 mg/dL — ABNORMAL HIGH (ref 6–23)
CO2: 28 mEq/L (ref 19–32)
Calcium: 9.4 mg/dL (ref 8.4–10.5)
Chloride: 99 mEq/L (ref 96–112)
Creatinine, Ser: 1.16 mg/dL (ref 0.40–1.50)
GFR: 64.79 mL/min (ref >60.00)
Glucose, Bld: 94 mg/dL (ref 70–99)
Potassium: 5 mEq/L (ref 3.5–5.1)
Sodium: 136 mEq/L (ref 135–145)
Total Bilirubin: 0.6 mg/dL (ref 0.2–1.2)
Total Protein: 7 g/dL (ref 6.0–8.3)

## 2022-09-13 LAB — CBC WITH DIFFERENTIAL/PLATELET
Basophils Absolute: 0 10*3/uL (ref 0.0–0.1)
Basophils Relative: 0.2 % (ref 0.0–3.0)
Eosinophils Absolute: 0.2 10*3/uL (ref 0.0–0.7)
Eosinophils Relative: 3.1 % (ref 0.0–5.0)
HCT: 41.6 % (ref 39.0–52.0)
Hemoglobin: 14.1 g/dL (ref 13.0–17.0)
Lymphocytes Relative: 29.6 % (ref 12.0–46.0)
Lymphs Abs: 2.4 10*3/uL (ref 0.7–4.0)
MCHC: 33.9 g/dL (ref 30.0–36.0)
MCV: 88.9 fl (ref 78.0–100.0)
Monocytes Absolute: 0.5 10*3/uL (ref 0.1–1.0)
Monocytes Relative: 6.2 % (ref 3.0–12.0)
Neutro Abs: 4.9 10*3/uL (ref 1.4–7.7)
Neutrophils Relative %: 60.9 % (ref 43.0–77.0)
Platelets: 162 10*3/uL (ref 150.0–400.0)
RBC: 4.68 Mil/uL (ref 4.22–5.81)
RDW: 13.1 % (ref 11.5–15.5)
WBC: 8.1 10*3/uL (ref 4.0–10.5)

## 2022-09-13 LAB — LIPID PANEL
Cholesterol: 165 mg/dL (ref 0–200)
HDL: 63.2 mg/dL (ref >39.00)
LDL Cholesterol: 87 mg/dL (ref 0–99)
NonHDL: 101.56
Total CHOL/HDL Ratio: 3
Triglycerides: 74 mg/dL (ref 0.0–149.0)
VLDL: 14.8 mg/dL (ref 0.0–40.0)

## 2022-09-13 LAB — VITAMIN B12: Vitamin B-12: 697 pg/mL (ref 211–911)

## 2022-09-18 ENCOUNTER — Encounter: Payer: Self-pay | Admitting: Family Medicine

## 2022-09-18 ENCOUNTER — Ambulatory Visit (INDEPENDENT_AMBULATORY_CARE_PROVIDER_SITE_OTHER): Payer: Medicare Other | Admitting: Family Medicine

## 2022-09-18 VITALS — BP 110/80 | HR 75 | Temp 98.1°F | Ht 74.0 in | Wt 187.3 lb

## 2022-09-18 DIAGNOSIS — I700 Atherosclerosis of aorta: Secondary | ICD-10-CM

## 2022-09-18 DIAGNOSIS — Z0000 Encounter for general adult medical examination without abnormal findings: Secondary | ICD-10-CM | POA: Diagnosis not present

## 2022-09-18 DIAGNOSIS — C61 Malignant neoplasm of prostate: Secondary | ICD-10-CM | POA: Diagnosis not present

## 2022-09-18 MED ORDER — SIMVASTATIN 40 MG PO TABS: 40.0000 mg | ORAL_TABLET | Freq: Every day | ORAL | 3 refills | Status: DC

## 2022-09-18 MED ORDER — CITALOPRAM HYDROBROMIDE 20 MG PO TABS: 20.0000 mg | ORAL_TABLET | Freq: Every day | ORAL | 5 refills | Status: DC

## 2022-09-18 NOTE — Patient Instructions (Addendum)
-   he is feeling well with reduction to 20 mg- is going to reduce to half tablet of 20 mg (so 10 mg) on may 1st as long as he continues to do well- after 3 more months could either stop or reach out to me and I can send in 10 mg to take half of  Recommended follow up: Return in about 1 year (around 09/18/2023) for physical or sooner if needed.Schedule b4 you leave.

## 2022-09-18 NOTE — Progress Notes (Signed)
Phone: [phone]   Subjective:  Patient presents today for their annual physical. Chief complaint-noted.   See problem oriented charting- ROS- full  review of systems was completed and negative  Per full ROS sheet completed by patient- other than some worsened congestion on cruises or at a hotel  The following were reviewed and entered/updated in epic: Past Medical History:  Diagnosis Date   Allergy    Chronic lower back pain    Herniated disc    L1   Hypercholesteremia    IBS (irritable bowel syndrome)    ? possible   Prostate cancer [facility])    Patient Active Problem List   Diagnosis Date Noted   Multifocal pneumonia 08/05/2019    Priority: High   Severe persistent asthma with (acute) exacerbation 11/17/2018    Priority: High   Malignant neoplasm of prostate ([place]) 04/26/2015    Priority: High   Anosmia 08/16/2020    Priority: Medium    Aortic atherosclerosis ([place]) 08/10/2019    Priority: Medium    Coronary artery calcification 08/10/2019    Priority: Medium    Tinea cruris 08/05/2019    Priority: Medium    Chronic low back pain 01/10/2017    Priority: Medium    Urinary frequency 11/28/2011    Priority: Medium    Hyperlipidemia 03/21/2011    Priority: Medium    PTSD (post-traumatic stress disorder) 09/06/2010    Priority: Medium    Recurrent maxillary sinusitis 11/17/2018    Priority: Low   Other allergic rhinitis 11/17/2018    Priority: Low   IBS (irritable bowel syndrome)     Priority: Low   Asthma exacerbation 04/17/2021   Past Surgical History:  Procedure Laterality Date   CATARACT EXTRACTION     COLONOSCOPY     PILONIDAL CYST EXCISION  67 yrs old   PROSTATE BIOPSY  02/11/15    Family History  Problem Relation Age of Onset   Breast cancer Mother        survived without recurrence   Prostate cancer Father        around age 39 -surgeyr   Dementia Father        died from this at age 53   Healthy Sister    Healthy Brother    Hemochromatosis Son         none in patient   Down syndrome Son    Colon cancer Neg Hx    Esophageal cancer Neg Hx    Rectal cancer Neg Hx    Stomach cancer Neg Hx    Pancreatic cancer Neg Hx     Medications- reviewed and updated Current Outpatient Medications  Medication Sig Dispense Refill   albuterol (VENTOLIN HFA) 108 (90 Base) MCG/ACT inhaler USE 2 PUFFS EVERY 6 HOURS AS NEEDED FOR SHORTNESS OF BREATH AND WHEEZING. 18 g 2   Ascorbic Acid (VITAMIN C) 1000 MG tablet Take 1,000 mg by mouth daily.     budesonide (PULMICORT) 0.5 MG/2ML nebulizer solution Use half of saline irrigation through the nose. Then, mix respule in remainder and irrigate with that volume. Repeat twice daily.     fluticasone furoate-vilanterol (BREO ELLIPTA) 200-25 MCG/ACT AEPB USE 1 INHALATION ONCE DAILY 60 each 5   montelukast (SINGULAIR) 10 MG tablet Take 1 tablet (10 mg total) by mouth at bedtime. 30 tablet 11   Multiple Vitamin (MULTIVITAMIN WITH MINERALS) TABS tablet Take 1 tablet by mouth daily.     omeprazole (PRILOSEC) 20 MG capsule Take 20 mg by mouth  2 (two) times daily.     citalopram (CELEXA) 20 MG tablet Take 1 tablet (20 mg total) by mouth daily. 30 tablet 5   simvastatin (ZOCOR) 40 MG tablet Take 1 tablet (40 mg total) by mouth at bedtime. 90 tablet 3   No current facility-administered medications for this visit.    Allergies-reviewed and updated Allergies  Allergen Reactions   Codeine     REACTION: nausea vomiting Other reaction(s): Unknown    Social History   Social History Narrative   Married 67. Son with downs syndrome and daughter is deaf.       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       Hobbies: enjoys walking/exercise, painting, gardening.    Objective  Objective:  BP 110/80   Pulse 75   Temp 98.1 F (36.7 C)   Ht '6\' 2"'$  (1.88 m)   Wt 187 lb 4.8 oz (85 kg)   SpO2 96%   BMI 24.05 kg/m  Gen: NAD, resting comfortably HEENT: Mucous membranes are moist. Oropharynx  normal. Tympanic membrane scarring and tubes bilaterally noted Neck: no thyromegaly CV: RRR no murmurs rubs or gallops Lungs: CTAB no crackles, wheeze, rhonchi Abdomen: soft/nontender/nondistended/normal bowel sounds. No rebound or guarding.  Ext: no edema Skin: warm, dry Neuro: grossly normal, moves all extremities, PERRLA   Assessment and Plan  67 y.o. male presenting for annual physical.  Health Maintenance counseling: 1. Anticipatory guidance: Patient counseled regarding regular dental exams -q6 months, eye exams -yearly,  avoiding smoking and second hand smoke , limiting alcohol to 2 beverages per day , no illicit drugs .   2. Risk factor reduction:  Advised patient of need for regular exercise and diet rich and fruits and vegetables to reduce risk of heart attack and stroke.  Exercise- continues to walk 5 days a week and do F3 once a week.  Diet/weight management-reasonably healthy weight and diet.  Wt Readings from Last 3 Encounters:  09/18/22 187 lb 4.8 oz (85 kg)  08/29/22 188 lb 12.8 oz (85.6 kg)  08/16/22 189 lb 6.4 oz (85.9 kg)  3. Immunizations/screenings/ancillary studies-recommended holding on COVID-19 vaccination until the fall as well as RSV.   Immunization History  Administered Date(s) Administered   Covid-19, Mrna,Vaccine(Spikevax)66yr and older 06/14/2022   PFIZER(Purple Top)SARS-COV-2 Vaccination 08/20/2019, 09/10/2019, 05/10/2020   Pneumococcal Conjugate-13 05/24/2020   Pneumococcal Polysaccharide-23 12/04/2018   Td 01/14/1997, 01/20/2008   Tdap 06/17/2018  4. Prostate cancer- under active surveillance - had labs in January and stable. Reassuring prior biopsy.   5. Colon cancer screening - 05/09/2016 with 10-year repeat planned  6. Skin cancer screening-  has seen Dr. HRenda Rollslast fall. advised regular sunscreen use. Denies worrisome, changing, or new skin lesions.  7.  Never smoker 8. STD screening - opts out as monogamous  Status of chronic or acute  concerns   # Severe persistent asthma-has done very well recently on Breo and Singulair with albuterol and Pulmicort as needed  # GERD-reasonably well-controlled on omeprazole 20 mg- actually really helped breathing issues  -B12 normal on this regimen  # PTSD-doing reasonably well on citalopram 40 mg-at last visit he reported wanting to try to wean this-he was going to take a half of the 20 mg dose (half tablet of 40 mg) for up to 6 months then we could send in the 10 mg tablets to take for 3 to 6 months before stopping - he is feeling well with reduction to 20 mg- is  going to reduce to half tablet of 20 mg (so 10 mg) on may 1st as long as he continues to do well- after 3 more months could either stop or reach out to me and I can send in 10 mg to take half of  #hyperlipidemia # Aortic atherosclerosis/coronary calcifications S: Medication:Simvastatin 20 mg Lab Results  Component Value Date   CHOL 165 09/13/2022   HDL 63.20 09/13/2022   LDLCALC 87 09/13/2022   TRIG 74.0 09/13/2022   CHOLHDL 3 09/13/2022   A/P: Above ideal goal for LDL of 70 or less but still reasonably controlled-continue current medication-offered intensifying due to aortic atherosclerosis (presumed stable) and coronary artery calcifications-patient agrees to 40 mg trial of simvastatin   -prefers to wait until next year to pick up   Recommended follow up: Return in about 1 year (around 09/18/2023) for physical or sooner if needed.Schedule b4 you leave.  Lab/Order associations: already did labs   ICD-10-CM   1. Preventative health care  Z00.00     2. Aortic atherosclerosis (HCC) Chronic I70.0     3. Cancer of prostate ([place]) Chronic C61      Meds ordered this encounter  Medications   citalopram (CELEXA) 20 MG tablet    Sig: Take 1 tablet (20 mg total) by mouth daily.    Dispense:  30 tablet    Refill:  5   simvastatin (ZOCOR) 40 MG tablet    Sig: Take 1 tablet (40 mg total) by mouth at bedtime.    Dispense:  90  tablet    Refill:  3    Return precautions advised.  [name], MD

## 2022-09-20 ENCOUNTER — Encounter: Payer: Self-pay | Admitting: Family Medicine

## 2022-10-03 DIAGNOSIS — K219 Gastro-esophageal reflux disease without esophagitis: Secondary | ICD-10-CM | POA: Diagnosis not present

## 2022-10-03 DIAGNOSIS — Z9622 Myringotomy tube(s) status: Secondary | ICD-10-CM | POA: Diagnosis not present

## 2022-10-03 DIAGNOSIS — H6993 Unspecified Eustachian tube disorder, bilateral: Secondary | ICD-10-CM | POA: Diagnosis not present

## 2022-10-10 ENCOUNTER — Telehealth: Payer: Self-pay | Admitting: Family Medicine

## 2022-10-10 NOTE — Telephone Encounter (Signed)
Contacted Edward Lee to schedule their annual wellness visit. Appointment made for 10/22/2022.  [place] Direct Dial [phone]

## 2022-10-22 ENCOUNTER — Ambulatory Visit (INDEPENDENT_AMBULATORY_CARE_PROVIDER_SITE_OTHER): Payer: Medicare Other

## 2022-10-22 VITALS — Wt 187.0 lb

## 2022-10-22 DIAGNOSIS — Z0000 Encounter for general adult medical examination without abnormal findings: Secondary | ICD-10-CM

## 2022-10-22 NOTE — Progress Notes (Signed)
I connected with  Edward Lee on 10/22/22 by a audio enabled telemedicine application and verified that I am speaking with the correct person using two identifiers.  Patient Location: Home  Provider Location: Office/Clinic  I discussed the limitations of evaluation and management by telemedicine. The patient expressed understanding and agreed to proceed.   Subjective:   Edward Lee is a 67 y.o. male who presents for an Initial Medicare Annual Wellness Visit.  Review of Systems     Cardiac Risk Factors include: advanced age (>70men, >71 women);male gender;dyslipidemia     Objective:    Today's Vitals   10/22/22 1102  Weight: 187 lb (84.8 kg)   Body mass index is 24.01 kg/m.     10/22/2022   11:06 AM 04/17/2021    8:04 AM 04/17/2021    4:41 AM 08/04/2019   10:47 PM 04/26/2015    7:46 AM 04/26/2015    7:39 AM  Advanced Directives  Does Patient Have a Medical Advance Directive? Yes No No No Yes No  Type of Estate agent of [place];Living will    Living will   Copy of Healthcare Power of Attorney in Chart? No - copy requested       Would patient like information on creating a medical advance directive?  No - Patient declined  Yes (ED - Information included in AVS) No - patient declined information No - patient declined information    Current Medications (verified) Outpatient Encounter Medications as of 10/22/2022  Medication Sig   albuterol (VENTOLIN HFA) 108 (90 Base) MCG/ACT inhaler USE 2 PUFFS EVERY 6 HOURS AS NEEDED FOR SHORTNESS OF BREATH AND WHEEZING.   Ascorbic Acid (VITAMIN C) 1000 MG tablet Take 1,000 mg by mouth daily.   budesonide (PULMICORT) 0.5 MG/2ML nebulizer solution Use half of saline irrigation through the nose. Then, mix respule in remainder and irrigate with that volume. Repeat twice daily.   citalopram (CELEXA) 20 MG tablet Take 1 tablet (20 mg total) by mouth daily.   fluticasone furoate-vilanterol (BREO ELLIPTA) 200-25 MCG/ACT  AEPB USE 1 INHALATION ONCE DAILY   montelukast (SINGULAIR) 10 MG tablet Take 1 tablet (10 mg total) by mouth at bedtime.   Multiple Vitamin (MULTIVITAMIN WITH MINERALS) TABS tablet Take 1 tablet by mouth daily.   omeprazole (PRILOSEC) 20 MG capsule Take 20 mg by mouth 2 (two) times daily.   simvastatin (ZOCOR) 40 MG tablet Take 1 tablet (40 mg total) by mouth at bedtime.   No facility-administered encounter medications on file as of 10/22/2022.    Allergies (verified) Codeine   History: Past Medical History:  Diagnosis Date   Allergy    Chronic lower back pain    Herniated disc    L1   Hypercholesteremia    IBS (irritable bowel syndrome)    ? possible   Prostate cancer    Past Surgical History:  Procedure Laterality Date   CATARACT EXTRACTION     COLONOSCOPY     PILONIDAL CYST EXCISION  67 yrs old   PROSTATE BIOPSY  02/11/15   Family History  Problem Relation Age of Onset   Breast cancer Mother        survived without recurrence   Prostate cancer Father        around age 35 -surgeyr   Dementia Father        died from this at age 27   Healthy Sister    Healthy Brother    Hemochromatosis Son  none in patient   Down syndrome Son    Colon cancer Neg Hx    Esophageal cancer Neg Hx    Rectal cancer Neg Hx    Stomach cancer Neg Hx    Pancreatic cancer Neg Hx    Social History   Socioeconomic History   Marital status: Married    Spouse name: Not on file   Number of children: 2   Years of education: Not on file   Highest education level: Not on file  Occupational History   Occupation: Emergency planning/management officer  Tobacco Use   Smoking status: Never    Passive exposure: Never   Smokeless tobacco: Never  Vaping Use   Vaping Use: Never used  Substance and Sexual Activity   Alcohol use: Yes    Alcohol/week: 14.0 - 15.0 standard drinks of alcohol    Types: 7 - 8 Glasses of wine, 7 Standard drinks or equivalent per week    Comment: occ   Drug use: No   Sexual  activity: Yes  Other Topics Concern   Not on file  Social History Narrative   Married 74. Son with downs syndrome and daughter is deaf.       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       Hobbies: enjoys walking/exercise, painting, gardening.    Social Determinants of Health   Financial Resource Strain: Low Risk  (10/22/2022)   Overall Financial Resource Strain (CARDIA)    Difficulty of Paying Living Expenses: Not hard at all  Food Insecurity: No Food Insecurity (10/22/2022)   Hunger Vital Sign    Worried About Running Out of Food in the Last Year: Never true    Ran Out of Food in the Last Year: Never true  Transportation Needs: No Transportation Needs (10/22/2022)   PRAPARE - Administrator, Civil Service (Medical): No    Lack of Transportation (Non-Medical): No  Physical Activity: Sufficiently Active (10/22/2022)   Exercise Vital Sign    Days of Exercise per Week: 7 days    Minutes of Exercise per Session: 90 min  Stress: No Stress Concern Present (10/22/2022)   [employer] of Occupational Health - Occupational Stress Questionnaire    Feeling of Stress : Not at all  Social Connections: Socially Integrated (10/22/2022)   Social Connection and Isolation Panel [NHANES]    Frequency of Communication with Friends and Family: More than three times a week    Frequency of Social Gatherings with Friends and Family: More than three times a week    Attends Religious Services: More than 4 times per year    Active Member of [employer] or Organizations: Yes    Attends Banker Meetings: 1 to 4 times per year    Marital Status: Married    Tobacco Counseling Counseling given: Not Answered   Clinical Intake:  Pre-visit preparation completed: Yes  Pain : No/denies pain     BMI - recorded: 24.01 Nutritional Status: BMI of 19-24  Normal Nutritional Risks: None Diabetes: No  How often do you need to have someone help you when you read instructions,  pamphlets, or other written materials from your doctor or pharmacy?: 1 - Never  Diabetic?no  Interpreter Needed?: No  Information entered by :: Lanier Ensign, LPN   Activities of Daily Living    10/22/2022   11:07 AM  In your present state of health, do you have any difficulty performing the following activities:  Hearing? 0  Vision? 0  Difficulty concentrating or making decisions? 0  Walking or climbing stairs? 0  Dressing or bathing? 0  Doing errands, shopping? 0  Preparing Food and eating ? N  Using the Toilet? N  In the past six months, have you accidently leaked urine? N  Do you have problems with loss of bowel control? N  Managing your Medications? N  Managing your Finances? N  Housekeeping or managing your Housekeeping? N    Patient Care Team: Shelva MajesticHunter, Stephen O, MD as PCP - General (Family Medicine)  Indicate any recent Medical Services you may have received from other than [facility] providers in the past year (date may be approximate).     Assessment:   This is a routine wellness examination for Edward Lee.  Hearing/Vision screen Hearing Screening - Comments:: Pt denies any hearing issues  Vision Screening - Comments:: Pt follows up with Dr Jethro BolusMark Shapiro for annual eye exams   Dietary issues and exercise activities discussed: Current Exercise Habits: Home exercise routine, Type of exercise: walking;Other - see comments, Time (Minutes): > 60, Frequency (Times/Week): 7, Weekly Exercise (Minutes/Week): 0   Goals Addressed             This Visit's Progress    Patient Stated       Continue to lose weight and exercise        Depression Screen    10/22/2022   11:05 AM 09/18/2022    2:01 PM 08/29/2022    9:00 AM 08/16/2022   11:26 AM 02/23/2022    4:33 PM 08/16/2020    3:47 PM 05/24/2020    1:03 PM  PHQ 2/9 Scores  PHQ - 2 Score 0 0 0 0 0 0 0  PHQ- 9 Score 0 0 0 0       Fall Risk    10/22/2022   11:07 AM 09/18/2022    2:01 PM 08/29/2022    9:00 AM 08/16/2022   11:25 AM  02/23/2022    4:33 PM  Fall Risk   Falls in the past year? 0 0 0 0 0  Number falls in past yr: 0 0 0 0 0  Injury with Fall? 0 0 0 0 0  Risk for fall due to : Impaired vision No Fall Risks No Fall Risks No Fall Risks No Fall Risks  Follow up Falls prevention discussed Falls evaluation completed  Falls evaluation completed Falls evaluation completed    FALL RISK PREVENTION PERTAINING TO THE HOME:  Any stairs in or around the home? No  If so, are there any without handrails? No  Home free of loose throw rugs in walkways, pet beds, electrical cords, etc? Yes  Adequate lighting in your home to reduce risk of falls? Yes   ASSISTIVE DEVICES UTILIZED TO PREVENT FALLS:  Life alert? No  Use of a cane, walker or w/c? No  Grab bars in the bathroom? No  Shower chair or bench in shower? No  Elevated toilet seat or a handicapped toilet? No   TIMED UP AND GO:  Was the test performed? No .   Cognitive Function:        10/22/2022   11:08 AM  6CIT Screen  What Year? 0 points  What month? 0 points  What time? 0 points  Count back from 20 0 points  Months in reverse 0 points  Repeat phrase 0 points  Total Score 0 points    Immunizations Immunization History  Administered Date(s) Administered   Covid-19, Mrna,Vaccine(Spikevax)7667yrs and  older 06/14/2022   PFIZER(Purple Top)SARS-COV-2 Vaccination 08/20/2019, 09/10/2019, 05/10/2020   Pneumococcal Conjugate-13 05/24/2020   Pneumococcal Polysaccharide-23 12/04/2018   Td 01/14/1997, 01/20/2008   Tdap 06/17/2018    TDAP status: Up to date  Flu Vaccine status: Declined, Education has been provided regarding the importance of this vaccine but patient still declined. Advised may receive this vaccine at local pharmacy or Health Dept. Aware to provide a copy of the vaccination record if obtained from local pharmacy or Health Dept. Verbalized acceptance and understanding.  Pneumococcal vaccine status: Up to date  Covid-19 vaccine status:  Completed vaccines  Qualifies for Shingles Vaccine? No    Screening Tests Health Maintenance  Topic Date Due   COVID-19 Vaccine (5 - 2023-24 season) 08/09/2022   Hepatitis C Screening  07/12/2098 (Originally 05/06/1974)   INFLUENZA VACCINE  02/14/2023   Medicare Annual Wellness (AWV)  10/22/2023   Pneumonia Vaccine 84+ Years old (3 of 3 - PPSV23 or PCV20) 05/24/2025   COLONOSCOPY (Pts 45-60yrs Insurance coverage will need to be confirmed)  05/09/2026   DTaP/Tdap/Td (4 - Td or Tdap) 06/17/2028   HPV VACCINES  Aged Out   Zoster Vaccines- Shingrix  Discontinued    Health Maintenance  Health Maintenance Due  Topic Date Due   COVID-19 Vaccine (5 - 2023-24 season) 08/09/2022    Colorectal cancer screening: Type of screening: Colonoscopy. Completed 05/09/16. Repeat every 10 years  Additional Screening:  Hepatitis C Screening: does qualify;  Vision Screening: Recommended annual ophthalmology exams for early detection of glaucoma and other disorders of the eye. Is the patient up to date with their annual eye exam?  Yes  Who is the provider or what is the name of the office in which the patient attends annual eye exams? Dr Nile Riggs If pt is not established with a provider, would they like to be referred to a provider to establish care? No .   Dental Screening: Recommended annual dental exams for proper oral hygiene  Community Resource Referral / Chronic Care Management: CRR required this visit?  No   CCM required this visit?  No      Plan:     I have personally reviewed and noted the following in the patient's chart:   Medical and social history Use of alcohol, tobacco or illicit drugs  Current medications and supplements including opioid prescriptions. Patient is not currently taking opioid prescriptions. Functional ability and status Nutritional status Physical activity Advanced directives List of other physicians Hospitalizations, surgeries, and ER visits in previous  12 months Vitals Screenings to include cognitive, depression, and falls Referrals and appointments  In addition, I have reviewed and discussed with patient certain preventive protocols, quality metrics, and best practice recommendations. A written personalized care plan for preventive services as well as general preventive health recommendations were provided to patient.     Marzella Schlein, LPN   11/20/8467   Nurse Notes: none

## 2022-10-22 NOTE — Patient Instructions (Signed)
Edward Lee , Thank you for taking time to come for your Medicare Wellness Visit. I appreciate your ongoing commitment to your health goals. Please review the following plan we discussed and let me know if I can assist you in the future.   These are the goals we discussed:  Goals      Patient Stated     Continue to lose weight and exercise         This is a list of the screening recommended for you and due dates:  Health Maintenance  Topic Date Due   COVID-19 Vaccine (5 - 2023-24 season) 08/09/2022   Hepatitis C Screening: USPSTF Recommendation to screen - Ages 18-79 yo.  07/12/2098*   Flu Shot  02/14/2023   Medicare Annual Wellness Visit  10/22/2023   Pneumonia Vaccine (3 of 3 - PPSV23 or PCV20) 05/24/2025   Colon Cancer Screening  05/09/2026   DTaP/Tdap/Td vaccine (4 - Td or Tdap) 06/17/2028   HPV Vaccine  Aged Out   Zoster (Shingles) Vaccine  Discontinued  *Topic was postponed. The date shown is not the original due date.    Advanced directives: Please bring a copy of your health care power of attorney and living will to the office at your convenience.  Conditions/risks identified: continue to lose weight and exercise   Next appointment: Follow up in one year for your annual wellness visit.   Preventive Care 70 Years and Older, Male  Preventive care refers to lifestyle choices and visits with your health care provider that can promote health and wellness. What does preventive care include? A yearly physical exam. This is also called an annual well check. Dental exams once or twice a year. Routine eye exams. Ask your health care provider how often you should have your eyes checked. Personal lifestyle choices, including: Daily care of your teeth and gums. Regular physical activity. Eating a healthy diet. Avoiding tobacco and drug use. Limiting alcohol use. Practicing safe sex. Taking low doses of aspirin every day. Taking vitamin and mineral supplements as recommended  by your health care provider. What happens during an annual well check? The services and screenings done by your health care provider during your annual well check will depend on your age, overall health, lifestyle risk factors, and family history of disease. Counseling  Your health care provider may ask you questions about your: Alcohol use. Tobacco use. Drug use. Emotional well-being. Home and relationship well-being. Sexual activity. Eating habits. History of falls. Memory and ability to understand (cognition). Work and work Astronomer. Screening  You may have the following tests or measurements: Height, weight, and BMI. Blood pressure. Lipid and cholesterol levels. These may be checked every 5 years, or more frequently if you are over 99 years old. Skin check. Lung cancer screening. You may have this screening every year starting at age 38 if you have a 30-pack-year history of smoking and currently smoke or have quit within the past 15 years. Fecal occult blood test (FOBT) of the stool. You may have this test every year starting at age 79. Flexible sigmoidoscopy or colonoscopy. You may have a sigmoidoscopy every 5 years or a colonoscopy every 10 years starting at age 57. Prostate cancer screening. Recommendations will vary depending on your family history and other risks. Hepatitis C blood test. Hepatitis B blood test. Sexually transmitted disease (STD) testing. Diabetes screening. This is done by checking your blood sugar (glucose) after you have not eaten for a while (fasting). You may have this  done every 1-3 years. Abdominal aortic aneurysm (AAA) screening. You may need this if you are a current or former smoker. Osteoporosis. You may be screened starting at age 71 if you are at high risk. Talk with your health care provider about your test results, treatment options, and if necessary, the need for more tests. Vaccines  Your health care provider may recommend certain  vaccines, such as: Influenza vaccine. This is recommended every year. Tetanus, diphtheria, and acellular pertussis (Tdap, Td) vaccine. You may need a Td booster every 10 years. Zoster vaccine. You may need this after age 60. Pneumococcal 13-valent conjugate (PCV13) vaccine. One dose is recommended after age 44. Pneumococcal polysaccharide (PPSV23) vaccine. One dose is recommended after age 55. Talk to your health care provider about which screenings and vaccines you need and how often you need them. This information is not intended to replace advice given to you by your health care provider. Make sure you discuss any questions you have with your health care provider. Document Released: 07/29/2015 Document Revised: 03/21/2016 Document Reviewed: 05/03/2015 Elsevier Interactive Patient Education  2017 [employer].  Fall Prevention in the Home Falls can cause injuries. They can happen to people of all ages. There are many things you can do to make your home safe and to help prevent falls. What can I do on the outside of my home? Regularly fix the edges of walkways and driveways and fix any cracks. Remove anything that might make you trip as you walk through a door, such as a raised step or threshold. Trim any bushes or trees on the path to your home. Use bright outdoor lighting. Clear any walking paths of anything that might make someone trip, such as rocks or tools. Regularly check to see if handrails are loose or broken. Make sure that both sides of any steps have handrails. Any raised decks and porches should have guardrails on the edges. Have any leaves, snow, or ice cleared regularly. Use sand or salt on walking paths during winter. Clean up any spills in your garage right away. This includes oil or grease spills. What can I do in the bathroom? Use night lights. Install grab bars by the toilet and in the tub and shower. Do not use towel bars as grab bars. Use non-skid mats or decals in  the tub or shower. If you need to sit down in the shower, use a plastic, non-slip stool. Keep the floor dry. Clean up any water that spills on the floor as soon as it happens. Remove soap buildup in the tub or shower regularly. Attach bath mats securely with double-sided non-slip rug tape. Do not have throw rugs and other things on the floor that can make you trip. What can I do in the bedroom? Use night lights. Make sure that you have a light by your bed that is easy to reach. Do not use any sheets or blankets that are too big for your bed. They should not hang down onto the floor. Have a firm chair that has side arms. You can use this for support while you get dressed. Do not have throw rugs and other things on the floor that can make you trip. What can I do in the kitchen? Clean up any spills right away. Avoid walking on wet floors. Keep items that you use a lot in easy-to-reach places. If you need to reach something above you, use a strong step stool that has a grab bar. Keep electrical cords out of  the way. Do not use floor polish or wax that makes floors slippery. If you must use wax, use non-skid floor wax. Do not have throw rugs and other things on the floor that can make you trip. What can I do with my stairs? Do not leave any items on the stairs. Make sure that there are handrails on both sides of the stairs and use them. Fix handrails that are broken or loose. Make sure that handrails are as long as the stairways. Check any carpeting to make sure that it is firmly attached to the stairs. Fix any carpet that is loose or worn. Avoid having throw rugs at the top or bottom of the stairs. If you do have throw rugs, attach them to the floor with carpet tape. Make sure that you have a light switch at the top of the stairs and the bottom of the stairs. If you do not have them, ask someone to add them for you. What else can I do to help prevent falls? Wear shoes that: Do not have high  heels. Have rubber bottoms. Are comfortable and fit you well. Are closed at the toe. Do not wear sandals. If you use a stepladder: Make sure that it is fully opened. Do not climb a closed stepladder. Make sure that both sides of the stepladder are locked into place. Ask someone to hold it for you, if possible. Clearly mark and make sure that you can see: Any grab bars or handrails. First and last steps. Where the edge of each step is. Use tools that help you move around (mobility aids) if they are needed. These include: Canes. Walkers. Scooters. Crutches. Turn on the lights when you go into a dark area. Replace any light bulbs as soon as they burn out. Set up your furniture so you have a clear path. Avoid moving your furniture around. If any of your floors are uneven, fix them. If there are any pets around you, be aware of where they are. Review your medicines with your doctor. Some medicines can make you feel dizzy. This can increase your chance of falling. Ask your doctor what other things that you can do to help prevent falls. This information is not intended to replace advice given to you by your health care provider. Make sure you discuss any questions you have with your health care provider. Document Released: 04/28/2009 Document Revised: 12/08/2015 Document Reviewed: 08/06/2014 Elsevier Interactive Patient Education  2017 [employer].

## 2022-10-31 ENCOUNTER — Other Ambulatory Visit: Payer: Self-pay | Admitting: Pulmonary Disease

## 2022-11-05 ENCOUNTER — Other Ambulatory Visit: Payer: Self-pay

## 2022-11-05 ENCOUNTER — Encounter: Payer: Self-pay | Admitting: Family Medicine

## 2022-11-05 MED ORDER — PREDNISONE 20 MG PO TABS: ORAL_TABLET | ORAL | 0 refills | Status: DC

## 2022-11-19 ENCOUNTER — Encounter: Payer: Self-pay | Admitting: Family Medicine

## 2022-11-19 MED ORDER — CITALOPRAM HYDROBROMIDE 10 MG PO TABS: 10.0000 mg | ORAL_TABLET | Freq: Every day | ORAL | 3 refills | Status: DC

## 2022-11-20 ENCOUNTER — Other Ambulatory Visit: Payer: Self-pay | Admitting: Pulmonary Disease

## 2022-11-29 ENCOUNTER — Other Ambulatory Visit: Payer: Self-pay | Admitting: Urology

## 2022-11-29 DIAGNOSIS — K080 Exfoliation of teeth due to systemic causes: Secondary | ICD-10-CM | POA: Diagnosis not present

## 2022-11-29 DIAGNOSIS — C61 Malignant neoplasm of prostate: Secondary | ICD-10-CM

## 2022-11-30 ENCOUNTER — Encounter: Payer: Self-pay | Admitting: Family Medicine

## 2022-12-03 ENCOUNTER — Other Ambulatory Visit: Payer: Self-pay | Admitting: Pulmonary Disease

## 2022-12-03 NOTE — Telephone Encounter (Signed)
Please advise on refill request

## 2022-12-20 DIAGNOSIS — Z9622 Myringotomy tube(s) status: Secondary | ICD-10-CM | POA: Diagnosis not present

## 2022-12-20 DIAGNOSIS — H6993 Unspecified Eustachian tube disorder, bilateral: Secondary | ICD-10-CM | POA: Diagnosis not present

## 2022-12-20 DIAGNOSIS — R0982 Postnasal drip: Secondary | ICD-10-CM | POA: Diagnosis not present

## 2022-12-20 DIAGNOSIS — R053 Chronic cough: Secondary | ICD-10-CM | POA: Diagnosis not present

## 2022-12-27 ENCOUNTER — Encounter: Payer: Self-pay | Admitting: Family Medicine

## 2023-01-20 ENCOUNTER — Encounter: Payer: Self-pay | Admitting: Family Medicine

## 2023-01-21 NOTE — Telephone Encounter (Signed)
FYI

## 2023-01-22 ENCOUNTER — Ambulatory Visit
Admission: RE | Admit: 2023-01-22 | Discharge: 2023-01-22 | Disposition: A | Discharge status: Home / Self Care | Payer: Medicare Other | Source: Ambulatory Visit | Attending: Urology | Admitting: Urology

## 2023-01-22 DIAGNOSIS — C61 Malignant neoplasm of prostate: Secondary | ICD-10-CM | POA: Diagnosis not present

## 2023-01-22 MED ORDER — GADOPICLENOL 0.5 MMOL/ML IV SOLN
10.0000 mL | Freq: Once | INTRAVENOUS | Status: AC | PRN
  Administered 2023-01-22: 10 mL via INTRAVENOUS

## 2023-01-28 ENCOUNTER — Other Ambulatory Visit: Payer: Self-pay | Admitting: Family Medicine

## 2023-01-28 DIAGNOSIS — C61 Malignant neoplasm of prostate: Secondary | ICD-10-CM | POA: Diagnosis not present

## 2023-02-02 ENCOUNTER — Encounter: Payer: Self-pay | Admitting: Family Medicine

## 2023-02-04 ENCOUNTER — Other Ambulatory Visit: Payer: Self-pay

## 2023-02-04 MED ORDER — CITALOPRAM HYDROBROMIDE 20 MG PO TABS: 20.0000 mg | ORAL_TABLET | Freq: Every day | ORAL | 5 refills | Status: DC

## 2023-02-07 DIAGNOSIS — C61 Malignant neoplasm of prostate: Secondary | ICD-10-CM | POA: Diagnosis not present

## 2023-03-07 DIAGNOSIS — Z9622 Myringotomy tube(s) status: Secondary | ICD-10-CM | POA: Diagnosis not present

## 2023-03-07 DIAGNOSIS — H9212 Otorrhea, left ear: Secondary | ICD-10-CM | POA: Diagnosis not present

## 2023-03-21 ENCOUNTER — Encounter: Payer: Self-pay | Admitting: Family Medicine

## 2023-04-08 ENCOUNTER — Other Ambulatory Visit: Payer: Self-pay | Admitting: Pulmonary Disease

## 2023-04-25 ENCOUNTER — Other Ambulatory Visit: Payer: Self-pay | Admitting: Pulmonary Disease

## 2023-04-25 DIAGNOSIS — S62633A Displaced fracture of distal phalanx of left middle finger, initial encounter for closed fracture: Secondary | ICD-10-CM | POA: Diagnosis not present

## 2023-04-25 DIAGNOSIS — W19XXXA Unspecified fall, initial encounter: Secondary | ICD-10-CM | POA: Diagnosis not present

## 2023-04-25 DIAGNOSIS — S62603A Fracture of unspecified phalanx of left middle finger, initial encounter for closed fracture: Secondary | ICD-10-CM | POA: Diagnosis not present

## 2023-04-25 DIAGNOSIS — S62609A Fracture of unspecified phalanx of unspecified finger, initial encounter for closed fracture: Secondary | ICD-10-CM | POA: Diagnosis not present

## 2023-04-25 DIAGNOSIS — S6992XA Unspecified injury of left wrist, hand and finger(s), initial encounter: Secondary | ICD-10-CM | POA: Diagnosis not present

## 2023-06-08 ENCOUNTER — Other Ambulatory Visit: Payer: Self-pay | Admitting: Pulmonary Disease

## 2023-06-11 NOTE — Telephone Encounter (Signed)
Rx sent to pharmacy. Patient will need OV scheduled for further refills.

## 2023-06-11 NOTE — Telephone Encounter (Signed)
Pt called in to get his Breo refilled

## 2023-06-21 ENCOUNTER — Telehealth: Payer: Self-pay | Admitting: Pulmonary Disease

## 2023-06-21 ENCOUNTER — Ambulatory Visit (INDEPENDENT_AMBULATORY_CARE_PROVIDER_SITE_OTHER): Payer: Medicare Other | Admitting: Family Medicine

## 2023-06-21 ENCOUNTER — Encounter: Payer: Self-pay | Admitting: Family Medicine

## 2023-06-21 VITALS — BP 118/80 | HR 67 | Temp 98.0°F | Ht 74.0 in | Wt 196.0 lb

## 2023-06-21 DIAGNOSIS — M549 Dorsalgia, unspecified: Secondary | ICD-10-CM | POA: Diagnosis not present

## 2023-06-21 DIAGNOSIS — M542 Cervicalgia: Secondary | ICD-10-CM | POA: Diagnosis not present

## 2023-06-21 DIAGNOSIS — E785 Hyperlipidemia, unspecified: Secondary | ICD-10-CM

## 2023-06-21 DIAGNOSIS — M25511 Pain in right shoulder: Secondary | ICD-10-CM | POA: Diagnosis not present

## 2023-06-21 DIAGNOSIS — M25512 Pain in left shoulder: Secondary | ICD-10-CM

## 2023-06-21 DIAGNOSIS — M79642 Pain in left hand: Secondary | ICD-10-CM | POA: Diagnosis not present

## 2023-06-21 DIAGNOSIS — G8929 Other chronic pain: Secondary | ICD-10-CM

## 2023-06-21 DIAGNOSIS — M47812 Spondylosis without myelopathy or radiculopathy, cervical region: Secondary | ICD-10-CM

## 2023-06-21 MED ORDER — FLUTICASONE FUROATE-VILANTEROL 200-25 MCG/ACT IN AEPB: 1.0000 | INHALATION_SPRAY | Freq: Every day | RESPIRATORY_TRACT | 0 refills | Status: DC

## 2023-06-21 MED ORDER — MELOXICAM 15 MG PO TABS: 15.0000 mg | ORAL_TABLET | Freq: Every day | ORAL | 0 refills | Status: DC

## 2023-06-21 NOTE — Telephone Encounter (Signed)
Breo refilled

## 2023-06-21 NOTE — Patient Instructions (Addendum)
Please go to [facility]  central X-[facility] (updated 09/10/2019) next week - located [address] across the [address] from [place] - in the basement - Hours: 8:30-5:00 PM M-F (with lunch from 12:30- 1 PM). You do NOT need an appointment.    Meloxicam for 10-14 days take with food and definitely take your omeprazole- increases stomach bleeding risk- stop if blood in stool or dark black stool  We have placed a referral for you today to Dr. Julien Girt. If you do not see # listed- below you should receive a mychart message or phone call within a week with the # to call directly (or you can just call him early next week)- call that as soon as you get it. If you are having issues getting scheduled reach out to [place] again.    Recommended follow up: Return for as needed for new, worsening, persistent symptoms.

## 2023-06-21 NOTE — Progress Notes (Signed)
Phone [phone] In person visit   Subjective:   Edward Lee is a 67 y.o. year old very pleasant male patient who presents for/with See problem oriented charting Chief Complaint  Patient presents with   Arthritis    Pt c/o arthritis all over his body that he has dealt with for a long time.    Past Medical History-  Patient Active Problem List   Diagnosis Date Noted   Multifocal pneumonia 08/05/2019    Priority: High   Severe persistent asthma with (acute) exacerbation 11/17/2018    Priority: High   Malignant neoplasm of prostate (HCC) 04/26/2015    Priority: High   Anosmia 08/16/2020    Priority: Medium    Aortic atherosclerosis (HCC) 08/10/2019    Priority: Medium    Coronary artery calcification 08/10/2019    Priority: Medium    Tinea cruris 08/05/2019    Priority: Medium    Chronic low back pain 01/10/2017    Priority: Medium    Urinary frequency 11/28/2011    Priority: Medium    Hyperlipidemia 03/21/2011    Priority: Medium    PTSD (post-traumatic stress disorder) 09/06/2010    Priority: Medium    Recurrent maxillary sinusitis 11/17/2018    Priority: Low   Other allergic rhinitis 11/17/2018    Priority: Low   IBS (irritable bowel syndrome)     Priority: Low   Asthma exacerbation 04/17/2021    Medications- reviewed and updated Current Outpatient Medications  Medication Sig Dispense Refill   albuterol (VENTOLIN HFA) 108 (90 Base) MCG/ACT inhaler USE 2 PUFFS EVERY 6 HOURS AS NEEDED FOR SHORTNESS OF BREATH AND WHEEZING. 18 g 5   Ascorbic Acid (VITAMIN C) 1000 MG tablet Take 1,000 mg by mouth daily.     budesonide (PULMICORT) 0.5 MG/2ML nebulizer solution Use half of saline irrigation through the nose. Then, mix respule in remainder and irrigate with that volume. Repeat twice daily.     citalopram (CELEXA) 20 MG tablet Take 1 tablet (20 mg total) by mouth daily. 30 tablet 5   fluticasone furoate-vilanterol (BREO ELLIPTA) 200-25 MCG/ACT AEPB Inhale 1 puff  into the lungs daily. NEEDS APPT FOR REFILLS 60 each 0   meloxicam (MOBIC) 15 MG tablet Take 1 tablet (15 mg total) by mouth daily. 14 tablet 0   montelukast (SINGULAIR) 10 MG tablet Take 1 tablet (10 mg total) by mouth at bedtime. 30 tablet 11   Multiple Vitamin (MULTIVITAMIN WITH MINERALS) TABS tablet Take 1 tablet by mouth daily.     omeprazole (PRILOSEC) 20 MG capsule Take 20 mg by mouth 2 (two) times daily.     simvastatin (ZOCOR) 40 MG tablet Take 1 tablet (40 mg total) by mouth at bedtime. 90 tablet 3   No current facility-administered medications for this visit.     Objective:  BP 118/80   Pulse 67   Temp 98 F (36.7 C)   Ht 6\' 2"  (1.88 m)   Wt 196 lb (88.9 kg)   SpO2 96%   BMI 25.16 kg/m  Gen: NAD, resting comfortably CV: RRR no murmurs rubs or gallops Lungs: CTAB no crackles, wheeze, rhonchi Ext: no edema Skin: warm, dry Neuro: grossly normal, moves all extremities Left hand- tender at 1st finger CMC and metacarpophalangeal joint joint, 3rd finger with pain at cmc and metacarpophalangeal joint and distal interphalangeal, 4th finger pain at metacarpophalangeal joint, 5th finger at metacarpophalangeal joint  -No midline pain along cervical, thoracic, lumbar spine-does have some paraspinous muscle tenderness  Assessment and Plan   # Arthralgias worsened after fall S:2 months ago during f3 was doing 3 man grinder- one part was to do 100 yard dash then run backwards. When he went to get up to do spring came out too low and ended sliding hard into the asphlat on his left hand and left face and shoulder- bruised up and scarred up on shoulder.   He went to [facility] on 04/25/23 after injury and from x-ray "1. Acute fracture-dislocation involving the DIP joint of the long finger,  with dislocation of the distal phalanx in the dorsal and ulnar direction,  with few adjacent tiny acute fracture fragments-minimally displaced.  2. Mild first MCP and severe CMC  degenerative changes.  3. Soft tissue edema involving the long finger. "  They reduced it in urgent care and buddy taped it - was able to heal at home- had no orthopedic follow up . Still hurts and has stiffness. Had laceration at base of 5th finger that had to heal by secondary intention   Cannot get fingers fully together after that- a lot of stiffness.   Since the fall has noted arthritis in multiple joints - upper back, neck, shoulders- similar areas from past just more prominent than usual. No morning joint stiffness and pain not worse in morning- is worse with laying on it and has to turn over in bed several times at night  Takes ibuprofen 1 tablet per day and helpful.  A/P: Patient with increased arthralgias above baseline after fall - Long-term history of issues in upper back, neck (known degenerative disc disease based on imaging 03/11/2017 concerning for flare of arthritis), bilateral shoulders per patient-will refer to Dr. Clyda Hurdle with physical therapy -Short-term 10 to 14 days of meloxicam to try to settle things down - For his hand with the prior fracture and ongoing pain about 2 months out offered hand surgeon referral but he would like to start with x-ray to evaluate healing and also get Dr. Orpha Bur opinion  #hyperlipidemia S: Medication:simvastatin 40 mg- felt fatigued and we opted to trial off then restart at 20 mg- he is taking half dose right now- fatigue improved  Lab Results  Component Value Date   CHOL 165 09/13/2022   HDL 63.20 09/13/2022   LDLCALC 87 09/13/2022   TRIG 74.0 09/13/2022   CHOLHDL 3 09/13/2022   A/P: he has done well with the half dose and wants to see if he can tolerate the full dose of 40 mg again- I think that's reasonable and will help with control- he can let me know if not helpful.   Recommended follow up: Return for as needed for new, worsening, persistent symptoms. Future Appointments  Date Time Provider Department Center  08/12/2023   1:15 PM Chilton Greathouse, MD [facility] None  10/07/2023 10:00 AM Shelva Majestic, MD [facility]   Lab/Order associations:   ICD-10-CM   1. Left hand pain  M79.642 Ambulatory referral to Physical Therapy    DG Hand Complete Left    2. Upper back pain  M54.9 Ambulatory referral to Physical Therapy    3. Chronic pain of both shoulders  M25.511 Ambulatory referral to Physical Therapy   G89.29    M25.512     4. Neck pain  M54.2 Ambulatory referral to Physical Therapy    5. 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 Hyperlipidemia, unspecified hyperlipidemia type  E78.5       Meds ordered this encounter  Medications   meloxicam (MOBIC) 15 MG tablet    Sig: Take 1 tablet (15 mg total) by mouth daily.    Dispense:  14 tablet    Refill:  0    Return precautions advised.  Tana Conch, MD

## 2023-06-21 NOTE — Telephone Encounter (Signed)
Patient needs a refill of Breo. He has been scheduled for the next available appointment with Dr.Mannam (January 27).  Pharmacy: [facility]

## 2023-06-24 ENCOUNTER — Ambulatory Visit (INDEPENDENT_AMBULATORY_CARE_PROVIDER_SITE_OTHER)
Admission: RE | Admit: 2023-06-24 | Discharge: 2023-06-24 | Disposition: A | Discharge status: Home / Self Care | Payer: Medicare Other | Source: Ambulatory Visit | Attending: Family Medicine | Admitting: Family Medicine

## 2023-06-24 DIAGNOSIS — M79642 Pain in left hand: Secondary | ICD-10-CM

## 2023-06-24 DIAGNOSIS — M19042 Primary osteoarthritis, left hand: Secondary | ICD-10-CM | POA: Diagnosis not present

## 2023-06-25 ENCOUNTER — Telehealth: Payer: Self-pay | Admitting: Pulmonary Disease

## 2023-06-25 NOTE — Telephone Encounter (Signed)
PT will need Breo called in to [facility] before next appt please.

## 2023-06-28 ENCOUNTER — Telehealth: Payer: Medicare Other | Admitting: Pulmonary Disease

## 2023-06-28 DIAGNOSIS — J4551 Severe persistent asthma with (acute) exacerbation: Secondary | ICD-10-CM

## 2023-06-28 DIAGNOSIS — J4550 Severe persistent asthma, uncomplicated: Secondary | ICD-10-CM

## 2023-06-28 MED ORDER — FLUTICASONE FUROATE-VILANTEROL 200-25 MCG/ACT IN AEPB: 1.0000 | INHALATION_SPRAY | Freq: Every day | RESPIRATORY_TRACT | 3 refills | Status: DC

## 2023-06-28 NOTE — Patient Instructions (Signed)
VISIT SUMMARY:  You came in for your annual follow-up and medication refill for asthma. You reported that you are doing well overall, with occasional asthma flare-ups that you manage effectively with a prednisone taper protocol. You have no new health concerns.  YOUR PLAN:  -ASTHMA: Asthma is a condition where your airways narrow and swell, producing extra mucus, which can make breathing difficult. You experience flare-ups approximately once every 1-2 months, usually due to weather changes. You manage these flare-ups with a prednisone taper (40-30-20-10mg ), which effectively returns your symptoms to baseline. We will continue with your current management strategy and send a refill for prednisone to [facility].  INSTRUCTIONS:  Please schedule a follow-up appointment in 1 year, or sooner if needed.

## 2023-06-28 NOTE — Progress Notes (Signed)
BENTZION SAXBY    [id number]    1956-02-02  Primary Care Physician:Hunter, Aldine Contes, MD  Referring Physician: Shelva Majestic, MD [address] [place],  [place] [address]  Virtual Visit via Video Note  I connected with Edward Lee on 06/28/23 at  2:00 PM EST by a video enabled telemedicine application and verified that I am speaking with the correct person using two identifiers.  Location: Patient: Home Provider: Office   I discussed the limitations of evaluation and management by telemedicine and the availability of in person appointments. The patient expressed understanding and agreed to proceed.  Chief complaint:  Follow up for asthma  HPI: 67 y.o. with history of allergies, hyperlipidemia, irritable bowel syndrome, prostate cancer.  Referred from recurrent exacerbations throughout 2019 Inhalers changed to Breo and Spiriva. He has history of seasonal allergies, denies acid reflux.  There is a strong history of asthma in the family.   Has hospitalized for community-acquired pneumonia at the end of January 2021. He tested negative for COVID 19 and was treated with azithromycin, Augmentin with improvement in symptoms Follow-up chest x-ray showed improvement in pulmonary infiltrates Sees Dr. Jenne Pane, ENT for recurrent sinusitis and nasal polyps. Patient underwent nasal polyp surgery in April 2022 with Dr. Jenne Pane   Pets: Dogs, no cats, birds Occupation: Works for [employer].  Works from home Exposures: No known exposures.  No mold at home.  Hardwood, carpet at home.  Forced air heating Smoking history: Never smoker  Travel History: Traveled to [place], [place] recently.  No other significant travel.  Interim History: Discussed the use of AI scribe software for clinical note transcription with the patient, who gave verbal consent to proceed.  The patient, with a history of asthma, presents for an annual follow-up and medication refill. He reports  doing well overall, with occasional asthma flare-ups occurring approximately once a month or once every six weeks, often triggered by weather changes. During these flare-ups, she follows a prednisone taper protocol (40mg , 30mg , 20mg , 10mg ) which effectively returns her to her baseline health. She estimates needing to use this protocol approximately once every six months. She is satisfied with this management strategy and has no new health concerns.       Outpatient Encounter Medications as of 06/28/2023  Medication Sig   albuterol (VENTOLIN HFA) 108 (90 Base) MCG/ACT inhaler USE 2 PUFFS EVERY 6 HOURS AS NEEDED FOR SHORTNESS OF BREATH AND WHEEZING.   Ascorbic Acid (VITAMIN C) 1000 MG tablet Take 1,000 mg by mouth daily.   budesonide (PULMICORT) 0.5 MG/2ML nebulizer solution Use half of saline irrigation through the nose. Then, mix respule in remainder and irrigate with that volume. Repeat twice daily.   citalopram (CELEXA) 20 MG tablet Take 1 tablet (20 mg total) by mouth daily.   fluticasone furoate-vilanterol (BREO ELLIPTA) 200-25 MCG/ACT AEPB Inhale 1 puff into the lungs daily. NEEDS APPT FOR REFILLS   meloxicam (MOBIC) 15 MG tablet Take 1 tablet (15 mg total) by mouth daily.   montelukast (SINGULAIR) 10 MG tablet Take 1 tablet (10 mg total) by mouth at bedtime.   Multiple Vitamin (MULTIVITAMIN WITH MINERALS) TABS tablet Take 1 tablet by mouth daily.   omeprazole (PRILOSEC) 20 MG capsule Take 20 mg by mouth 2 (two) times daily.   simvastatin (ZOCOR) 40 MG tablet Take 1 tablet (40 mg total) by mouth at bedtime.   No facility-administered encounter medications on file as of 06/28/2023.  Physical Exam: Tele  Data Reviewed: Imaging: Chest x-ray 08/04/2019-bilateral groundglass opacities, consolidation. CTA 08/05/2019-no pulmonary embolism, extensive upper lobe predominant bilateral consolidation groundglass with air bronchograms.  Mediastinal lymphadenopathy Chest x-ray 09/22/19-provement in lung  opacities with streaky residual opacities in the upper lobe. Chest x-ray 08/30/21 - prominent bronchial markings. No consolidation of pleural effusion. I have reviewed the images personally.  PFTs: 08/27/17 FVC 6.18 [117%], FEV1 5.09 [128%], F/F 82, TLC 112%, DLCO 94% Normal study.  FENO  07/25/17- 54 08/27/2017-85 11/22/2017-99 11/28/2017-157 02/27/2018-55  ACQ 7 02/12/2019- 0.43  ACT score  09/25/2019-23 02/08/2021-11  Labs: CBC 12/14/14-WBC count 6.6, absolute eosinophil count 400  CBC 07/25/17-WBC count 9.1, absolute eosinophil count 500 Blood allergy profile 07/25/17-IgE 58, RAST panel is negative.  Assessment:  Severe persistent asthma Symptoms are consistent with asthma with elevated FENO and CBCs showing eosinophilia. PFTs reviewed which do not show any obstruction.  There is improvement in mid flow rates post albuterol indicating small airways disease  Continues on Breo and Singulair He was previously on Spiriva but self discontinued We had repeated discussions regarding biologics which would be a good option for him due to recurrent exacerbations. He was approved for Dupixent due to recurrent exacerbations but he prefers to hold off for now  Flare-ups approximately once every 1-2 months, usually weather-related. Patient self-manages with prednisone taper (40-30-20-10mg ) which effectively returns symptoms to baseline. Patient prefers current management strategy over biologic therapy.  -Continue current management strategy.  Give a supply of prednisone to keep at hand in case of any exacerbations  Plan/Recommendations: - Continue Breo, singular  Follow-up in 12 months  Chilton Greathouse MD [facility] 06/28/2023, 2:00 PM  CC: Shelva Majestic, MD   I discussed the assessment and treatment plan with the patient. The patient was provided an opportunity to ask questions and all were answered. The patient agreed with the plan and demonstrated an  understanding of the instructions.   The patient was advised to call back or seek an in-person evaluation if the symptoms worsen or if the condition fails to improve as anticipated.

## 2023-06-29 NOTE — Telephone Encounter (Signed)
Pt had OV 12/13 and breo was refilled.

## 2023-07-02 ENCOUNTER — Telehealth: Payer: Self-pay

## 2023-07-02 ENCOUNTER — Other Ambulatory Visit (HOSPITAL_COMMUNITY): Payer: Self-pay

## 2023-07-02 NOTE — Telephone Encounter (Signed)
*  Pulm  Pharmacy Patient Advocate Encounter   Received notification from CoverMyMeds that prior authorization for Fluticasone Furoate-Vilanterol 200-25MCG/ACT aerosol powder  is required/requested.   Insurance verification completed.   The patient is insured through [facility] .   Per test claim: Refill too soon. PA is not needed at this time. Medication was filled 06/11/2023. Next eligible fill date is 07/04/2023.

## 2023-07-06 DIAGNOSIS — M79642 Pain in left hand: Secondary | ICD-10-CM | POA: Diagnosis not present

## 2023-07-06 DIAGNOSIS — M542 Cervicalgia: Secondary | ICD-10-CM | POA: Diagnosis not present

## 2023-07-06 DIAGNOSIS — M5459 Other low back pain: Secondary | ICD-10-CM | POA: Diagnosis not present

## 2023-07-24 ENCOUNTER — Telehealth: Payer: Self-pay

## 2023-07-24 NOTE — Telephone Encounter (Signed)
 Please see separate phone note.  Copied from CRM [id number]. Topic: General - Other >> Jul 23, 2023  3:20 PM Elizebeth Brooking wrote: Reason for CRM: Patient calling in stating returning from nurse is requesting a callback

## 2023-07-31 DIAGNOSIS — M79642 Pain in left hand: Secondary | ICD-10-CM | POA: Diagnosis not present

## 2023-07-31 DIAGNOSIS — M5459 Other low back pain: Secondary | ICD-10-CM | POA: Diagnosis not present

## 2023-07-31 DIAGNOSIS — M542 Cervicalgia: Secondary | ICD-10-CM | POA: Diagnosis not present

## 2023-08-12 ENCOUNTER — Ambulatory Visit: Payer: Medicare Other | Admitting: Pulmonary Disease

## 2023-08-13 DIAGNOSIS — M542 Cervicalgia: Secondary | ICD-10-CM | POA: Diagnosis not present

## 2023-08-13 DIAGNOSIS — M79642 Pain in left hand: Secondary | ICD-10-CM | POA: Diagnosis not present

## 2023-08-13 DIAGNOSIS — M5459 Other low back pain: Secondary | ICD-10-CM | POA: Diagnosis not present

## 2023-08-15 ENCOUNTER — Ambulatory Visit: Payer: Medicare Other | Admitting: Primary Care

## 2023-08-17 ENCOUNTER — Other Ambulatory Visit: Payer: Self-pay | Admitting: Family Medicine

## 2023-09-12 ENCOUNTER — Ambulatory Visit (INDEPENDENT_AMBULATORY_CARE_PROVIDER_SITE_OTHER): Payer: Medicare Other | Admitting: Internal Medicine

## 2023-09-12 ENCOUNTER — Ambulatory Visit (INDEPENDENT_AMBULATORY_CARE_PROVIDER_SITE_OTHER)
Admission: RE | Admit: 2023-09-12 | Discharge: 2023-09-12 | Disposition: A | Discharge status: Home / Self Care | Payer: Medicare Other | Source: Ambulatory Visit | Attending: Internal Medicine | Admitting: Internal Medicine

## 2023-09-12 ENCOUNTER — Encounter: Payer: Self-pay | Admitting: Internal Medicine

## 2023-09-12 ENCOUNTER — Ambulatory Visit: Payer: Self-pay | Admitting: Family Medicine

## 2023-09-12 VITALS — BP 110/70 | HR 83 | Temp 98.8°F | Ht 74.0 in | Wt 190.8 lb

## 2023-09-12 DIAGNOSIS — U071 COVID-19: Secondary | ICD-10-CM

## 2023-09-12 DIAGNOSIS — R059 Cough, unspecified: Secondary | ICD-10-CM | POA: Diagnosis not present

## 2023-09-12 DIAGNOSIS — R0602 Shortness of breath: Secondary | ICD-10-CM | POA: Diagnosis not present

## 2023-09-12 LAB — POCT INFLUENZA A/B
Influenza A, POC: NEGATIVE
Influenza B, POC: NEGATIVE

## 2023-09-12 LAB — POC COVID19 BINAXNOW: SARS Coronavirus 2 Ag: POSITIVE — AB

## 2023-09-12 MED ORDER — PROMETHAZINE-DM 6.25-15 MG/5ML PO SYRP
5.0000 mL | ORAL_SOLUTION | Freq: Four times a day (QID) | ORAL | 0 refills | Status: DC | PRN
Start: 2023-09-12 — End: 2023-09-18

## 2023-09-12 MED ORDER — AZITHROMYCIN 250 MG PO TABS
ORAL_TABLET | ORAL | 0 refills | Status: AC
Start: 2023-09-12 — End: 2023-09-17

## 2023-09-12 NOTE — Telephone Encounter (Signed)
    Chief Complaint: Pt. Had flu last week per wife. Still coughing with wheezing. Fatigue. He "just doesn't feel well. Worried about having pneumonia." Symptoms: Above Frequency: Last week Pertinent Negatives: Patient denies fever Disposition: [] ED /[] Urgent Care (no appt availability in office) / [x] Appointment(In office/virtual)/ []  [facility]/ [] Home Care/ [] Refused Recommended Disposition /[] [facility]/ []  Follow-up with PCP Additional Notes: Agrees with appointment.  Reason for Disposition  [1] MILD difficulty breathing (e.g., minimal/no SOB at rest, SOB with walking, pulse <100) AND [2] still present when not coughing  Protocols used: Cough - Acute Non-Productive-A-AH

## 2023-09-12 NOTE — Telephone Encounter (Signed)
 Red Word that prompted transfer to Nurse Triage: flu and afraid it may turn to pneumonia. Hard to breathe, doesn't look too good.  Answer Assessment - Initial Assessment Questions 1. ONSET: "When did the cough begin?"      Last week 2. SEVERITY: "How bad is the cough today?"      Severe 3. SPUTUM: "Describe the color of your sputum" (none, dry cough; clear, white, yellow, green)     None 4. HEMOPTYSIS: "Are you coughing up any blood?" If so ask: "How much?" (flecks, streaks, tablespoons, etc.)     No 5. DIFFICULTY BREATHING: "Are you having difficulty breathing?" If Yes, ask: "How bad is it?" (e.g., mild, moderate, severe)    - MILD: No SOB at rest, mild SOB with walking, speaks normally in sentences, can lie down, no retractions, pulse < 100.    - MODERATE: SOB at rest, SOB with minimal exertion and prefers to sit, cannot lie down flat, speaks in phrases, mild retractions, audible wheezing, pulse 100-120.    - SEVERE: Very SOB at rest, speaks in single words, struggling to breathe, sitting hunched forward, retractions, pulse > 120      Mild-moderate 6. FEVER: "Do you have a fever?" If Yes, ask: "What is your temperature, how was it measured, and when did it start?"     No 7. CARDIAC HISTORY: "Do you have any history of heart disease?" (e.g., heart attack, congestive heart failure)      No 8. LUNG HISTORY: "Do you have any history of lung disease?"  (e.g., pulmonary embolus, asthma, emphysema)     Asthma 9. PE RISK FACTORS: "Do you have a history of blood clots?" (or: recent major surgery, recent prolonged travel, bedridden)     No 10. OTHER SYMPTOMS: "Do you have any other symptoms?" (e.g., runny nose, wheezing, chest pain)       Wheezing, fatigue 11. PREGNANCY: "Is there any chance you are pregnant?" "When was your last menstrual period?"       N/a 12. TRAVEL: "Have you traveled out of the country in the last month?" (e.g., travel history, exposures)       No  Protocols used: Cough -  Acute Non-Productive-A-AH

## 2023-09-12 NOTE — Patient Instructions (Signed)
 Go for chest x-ray.

## 2023-09-12 NOTE — Progress Notes (Signed)
 [facility] [address] [place] [address] Dept: [phone] Dept Fax: [phone]  Acute Care Office Visit  Subjective:   Park Beck [facility] Sep 29, 1955 09/12/2023  Chief Complaint  Patient presents with   Influenza    Flu since Saturday down turn on yesterday 2021 pnu    HPI: Discussed the use of AI scribe software for clinical note transcription with the patient, who gave verbal consent to proceed.  History of Present Illness   The patient, with a history of asthma and pneumonia, presents with flu-like symptoms that started on Saturday. He reports initially symptoms improved, but then worsened yesterday. He reports feeling short of breath, wheezing, no energy, fatigued,  no appetite, and having a persistent cough. He describes the symptoms as similar to when he had pneumonia in 2021. He has been taking prednisone taper for his asthma symptoms. Despite the flu-like symptoms, he has not had a fever. No chest pain.       The following portions of the patient's history were reviewed and updated as appropriate: past medical history, past surgical history, family history, social history, allergies, medications, and problem list.   Patient Active Problem List   Diagnosis Date Noted   Asthma exacerbation 04/17/2021   Anosmia 08/16/2020   Aortic atherosclerosis (HCC) 08/10/2019   Coronary artery calcification 08/10/2019   Multifocal pneumonia 08/05/2019   Tinea cruris 08/05/2019   Recurrent maxillary sinusitis 11/17/2018   Other allergic rhinitis 11/17/2018   Severe persistent asthma with (acute) exacerbation 11/17/2018   Chronic low back pain 01/10/2017   Malignant neoplasm of prostate (HCC) 04/26/2015   IBS (irritable bowel syndrome)    Urinary frequency 11/28/2011   Hyperlipidemia 03/21/2011   PTSD (post-traumatic stress disorder) 09/06/2010   Past Medical History:  Diagnosis Date   Allergy    Chronic lower back  pain    Herniated disc    L1   Hypercholesteremia    IBS (irritable bowel syndrome)    ? possible   Prostate cancer [facility])    Past Surgical History:  Procedure Laterality Date   CATARACT EXTRACTION     COLONOSCOPY     PILONIDAL CYST EXCISION  68 yrs old   PROSTATE BIOPSY  02/11/15   Family History  Problem Relation Age of Onset   Breast cancer Mother        survived without recurrence   Prostate cancer Father        around age 70 -surgeyr   Dementia Father        died from this at age 70   Healthy Sister    Healthy Brother    Hemochromatosis Son        none in patient   Down syndrome Son    Colon cancer Neg Hx    Esophageal cancer Neg Hx    Rectal cancer Neg Hx    Stomach cancer Neg Hx    Pancreatic cancer Neg Hx     Current Outpatient Medications:    albuterol (VENTOLIN HFA) 108 (90 Base) MCG/ACT inhaler, USE 2 PUFFS EVERY 6 HOURS AS NEEDED FOR SHORTNESS OF BREATH AND WHEEZING., Disp: 18 g, Rfl: 5   Ascorbic Acid (VITAMIN C) 1000 MG tablet, Take 1,000 mg by mouth daily., Disp: , Rfl:    azithromycin (ZITHROMAX) 250 MG tablet, Take 2 tablets on day 1, then 1 tablet daily on days 2 through 5, Disp: 6 tablet, Rfl: 0   budesonide (PULMICORT) 0.5 MG/2ML nebulizer solution, Use half  of saline irrigation through the nose. Then, mix respule in remainder and irrigate with that volume. Repeat twice daily., Disp: , Rfl:    citalopram (CELEXA) 20 MG tablet, TAKE ONE TABLET BY MOUTH DAILY, Disp: 30 tablet, Rfl: 5   fluticasone furoate-vilanterol (BREO ELLIPTA) 200-25 MCG/ACT AEPB, Inhale 1 puff into the lungs daily. NEEDS APPT FOR REFILLS, Disp: 90 each, Rfl: 3   meloxicam (MOBIC) 15 MG tablet, Take 1 tablet (15 mg total) by mouth daily., Disp: 14 tablet, Rfl: 0   montelukast (SINGULAIR) 10 MG tablet, Take 1 tablet (10 mg total) by mouth at bedtime., Disp: 30 tablet, Rfl: 11   Multiple Vitamin (MULTIVITAMIN WITH MINERALS) TABS tablet, Take 1 tablet by mouth daily., Disp: , Rfl:     omeprazole (PRILOSEC) 20 MG capsule, Take 20 mg by mouth daily., Disp: , Rfl:    promethazine-dextromethorphan (PROMETHAZINE-DM) 6.25-15 MG/5ML syrup, Take 5 mLs by mouth 4 (four) times daily as needed for cough., Disp: 180 mL, Rfl: 0   simvastatin (ZOCOR) 40 MG tablet, Take 1 tablet (40 mg total) by mouth at bedtime., Disp: 90 tablet, Rfl: 3 Allergies  Allergen Reactions   Codeine     REACTION: nausea vomiting Other reaction(s): Unknown     ROS: A complete ROS was performed with pertinent positives/negatives noted in the HPI. The remainder of the ROS are negative.    Objective:   Today's Vitals   09/12/23 1027  BP: 110/70  Pulse: 83  Temp: 98.8 F (37.1 C)  TempSrc: Temporal  SpO2: 93%  Weight: 190 lb 12.8 oz (86.5 kg)  Height: 6\' 2"  (1.88 m)    GENERAL: Well-appearing, in NAD. Well nourished.  SKIN: Pink, warm and dry. No rash.  HEENT:    HEAD: Normocephalic, non-traumatic.  EYES: Conjunctive pink without exudate. PERRL.  EARS: External ear w/o redness, swelling, masses, or lesions. EAC clear. TM's intact, translucent w/o bulging, appropriate landmarks visualized.  NOSE: Septum midline w/o deformity. Nares patent, mucosa pink and non-inflamed w/o drainage. No sinus tenderness.  THROAT: Uvula midline. Oropharynx clear. Tonsils non-inflamed w/o exudate. Mucus membranes pink and moist.  NECK: Trachea midline. Full ROM w/o pain or tenderness. No lymphadenopathy.  RESPIRATORY: Chest wall symmetrical. Respirations even and non-labored. Expiratory wheezing RUL, rhonchi in LLL, diminished lower lobes bilaterally.  CARDIAC: S1, S2 present, regular rate and rhythm. Peripheral pulses 2+ bilaterally.  EXTREMITIES: Without clubbing, cyanosis, or edema.  NEUROLOGIC: Steady, even gait.  PSYCH/MENTAL STATUS: Alert, oriented x 3. Cooperative, appropriate mood and affect.    Results for orders placed or performed in visit on 09/12/23  POCT Influenza A/B  Result Value Ref Range    Influenza A, POC Negative Negative   Influenza B, POC Negative Negative  POC COVID-19 BinaxNow  Result Value Ref Range   SARS Coronavirus 2 Ag Positive (A) Negative      Assessment & Plan:  Assessment and Plan    COVID-19 with suspected pneumonia Positive rapid COVID-19 test. History of pneumonia in 2021. Current symptoms include shortness of breath, fatigue, cough, and wheezing. No fever reported. -Order chest x-ray to assess for pneumonia. -Start Azithromycin pack. -Prescribe Phenergan DM cough syrup, take as needed for cough. -Advise to monitor oxygen level with home pulse oximeter. If it drops below 90%, proceed to the emergency room or call 911. -Continue prednisone taper. -Continue using albuterol inhaler as needed. -Continue Breo as prescribed.       Meds ordered this encounter  Medications   azithromycin (ZITHROMAX) 250 MG tablet  Sig: Take 2 tablets on day 1, then 1 tablet daily on days 2 through 5    Dispense:  6 tablet    Refill:  0    Supervising Provider:   Garnette Gunner [[id number]]   promethazine-dextromethorphan (PROMETHAZINE-DM) 6.25-15 MG/5ML syrup    Sig: Take 5 mLs by mouth 4 (four) times daily as needed for cough.    Dispense:  180 mL    Refill:  0    Supervising Provider:   Garnette Gunner [[id number]]   Orders Placed This Encounter  Procedures   DG Chest 2 View    Standing Status:   Future    Expiration Date:   09/11/2024    Preferred imaging location?:   [facility]    Reason for exam::   cough, SHOB. + COVID. history of pneumonia. Concern for new onset pneumonia    Release to patient:   Immediate   POCT Influenza A/B   POC COVID-19 BinaxNow    Previously tested for COVID-19:   No    Resident in a congregate (group) care setting:   No    Employed in healthcare setting:   No   Lab Orders         POCT Influenza A/B         POC COVID-19 BinaxNow     No images are attached to the encounter or orders placed in the  encounter.  Return if symptoms worsen or fail to improve.   Salvatore Decent, FNP

## 2023-09-13 ENCOUNTER — Telehealth: Payer: Self-pay | Admitting: Family Medicine

## 2023-09-13 ENCOUNTER — Ambulatory Visit: Payer: Medicare Other | Admitting: Family Medicine

## 2023-09-13 NOTE — Telephone Encounter (Signed)
 Copied from CRM [id number]. 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

## 2023-09-16 NOTE — Telephone Encounter (Signed)
 Original letter was not written by you, ok to re write?

## 2023-09-16 NOTE — Telephone Encounter (Signed)
 To Whom It May Concern:  Due to COVID-19 illness (confirmed by lab), it is in my medical opinion that Edward Lee not attend his upcoming cruise ship vacation due to the severity of his symptoms and risks to others onboard.   If you have any questions or concerns, please don't hesitate to call.  Sincerely, Tana Conch, MD

## 2023-09-17 ENCOUNTER — Encounter: Payer: Self-pay | Admitting: Family Medicine

## 2023-09-17 NOTE — Telephone Encounter (Signed)
 I have made pt aware via mychart that new letter has been written and visible via mychart.

## 2023-09-18 ENCOUNTER — Encounter: Payer: Self-pay | Admitting: Family Medicine

## 2023-09-18 ENCOUNTER — Ambulatory Visit: Admitting: Family Medicine

## 2023-09-18 VITALS — BP 100/67 | HR 58 | Temp 97.0°F | Resp 18 | Ht 74.0 in | Wt 183.2 lb

## 2023-09-18 DIAGNOSIS — J40 Bronchitis, not specified as acute or chronic: Secondary | ICD-10-CM

## 2023-09-18 MED ORDER — CEFDINIR 300 MG PO CAPS: 300.0000 mg | ORAL_CAPSULE | Freq: Two times a day (BID) | ORAL | 0 refills | Status: DC

## 2023-09-18 NOTE — Patient Instructions (Signed)
Omnicef sent to pharmacy

## 2023-09-18 NOTE — Progress Notes (Signed)
 Subjective:     Patient ID: Edward Lee, male    DOB: 06/16/1956, 68 y.o.   MRN: [id number]  Chief Complaint  Patient presents with   Fever    Fever mostly at night, around 100, ranges from 95 to 101, normally feels bad again around 5 pm   Generalized Body Aches    Dx with flu 2 weeks ago and Covid 1 week ago, still having sx    Cough    Productive cough   Fatigue    Seen last Thursday, completed zpak on Monday    HPI Discussed the use of AI scribe software for clinical note transcription with the patient, who gave verbal consent to proceed.  History of Present Illness   Edward Lee is a 68 year old male with asthma who presents with persistent symptoms following COVID-19 and influenza infections.  He experienced persistent symptoms following recent influenza and COVID-19 infections. Initially, he contracted the flu two weeks ago, followed by a COVID-19 diagnosis a week ago. Despite recovering from the acute phase, he continues to experience malaise, fatigue, and intermittent fevers ranging from 95 to 101 degrees Fahrenheit, particularly worsening in the evenings. He feels strong enough during the day to perform some household activities but becomes significantly fatigued by evening. He also reports tiredness, weakness, and body aches, particularly in the legs and hips, which he attributes to the lingering effects of COVID-19.  He has a history of asthma and uses Breo and albuterol inhalers, though he has not needed albuterol for several days. He also takes montelukast regularly and is on a prednisone taper of 40, 30, 20, 10 mg for asthma management. He was prescribed a Z-Pak as a precaution against pneumonia and has a history of stable scarring on chest x-rays. No recent use of the albuterol inhaler is reported.  He lives with his wife and son who has special needs.       Health Maintenance Due  Topic Date Due   Medicare Annual Wellness (AWV)  10/22/2023   Pneumonia  Vaccine 65+ Years old (3 of 3 - PPSV23 or PCV20) 12/04/2023    Past Medical History:  Diagnosis Date   Allergy    Asthma    Chronic lower back pain    Herniated disc    L1   Hypercholesteremia    IBS (irritable bowel syndrome)    ? possible   Prostate cancer [facility])     Past Surgical History:  Procedure Laterality Date   CATARACT EXTRACTION     COLONOSCOPY     PILONIDAL CYST EXCISION  68 yrs old   PROSTATE BIOPSY  02/11/15     Current Outpatient Medications:    albuterol (VENTOLIN HFA) 108 (90 Base) MCG/ACT inhaler, USE 2 PUFFS EVERY 6 HOURS AS NEEDED FOR SHORTNESS OF BREATH AND WHEEZING., Disp: 18 g, Rfl: 5   Ascorbic Acid (VITAMIN C) 1000 MG tablet, Take 1,000 mg by mouth daily., Disp: , Rfl:    budesonide (PULMICORT) 0.5 MG/2ML nebulizer solution, Use half of saline irrigation through the nose. Then, mix respule in remainder and irrigate with that volume. Repeat twice daily., Disp: , Rfl:    cefdinir (OMNICEF) 300 MG capsule, Take 1 capsule (300 mg total) by mouth 2 (two) times daily., Disp: 14 capsule, Rfl: 0   citalopram (CELEXA) 20 MG tablet, TAKE ONE TABLET BY MOUTH DAILY, Disp: 30 tablet, Rfl: 5   fluticasone furoate-vilanterol (BREO ELLIPTA) 200-25 MCG/ACT AEPB, Inhale 1 puff into the lungs  daily. NEEDS APPT FOR REFILLS, Disp: 90 each, Rfl: 3   ipratropium (ATROVENT) 0.06 % nasal spray, SMARTSIG:2 Spray(s) Both Nares 4 Times Daily PRN, Disp: , Rfl:    montelukast (SINGULAIR) 10 MG tablet, Take 1 tablet (10 mg total) by mouth at bedtime., Disp: 30 tablet, Rfl: 11   Multiple Vitamin (MULTIVITAMIN WITH MINERALS) TABS tablet, Take 1 tablet by mouth daily., Disp: , Rfl:    omeprazole (PRILOSEC) 20 MG capsule, Take 20 mg by mouth daily., Disp: , Rfl:    simvastatin (ZOCOR) 40 MG tablet, Take 1 tablet (40 mg total) by mouth at bedtime., Disp: 90 tablet, Rfl: 3   meloxicam (MOBIC) 15 MG tablet, Take 1 tablet (15 mg total) by mouth daily. (Patient not taking: Reported on 09/18/2023),  Disp: 14 tablet, Rfl: 0  Allergies  Allergen Reactions   Codeine     REACTION: nausea vomiting Other reaction(s): Unknown   ROS neg/noncontributory except as noted HPI/below      Objective:     BP 100/67   Pulse (!) 58   Temp (!) 97 F (36.1 C) (Temporal)   Resp 18   Ht 6\' 2"  (1.88 m)   Wt 183 lb 4 oz (83.1 kg)   SpO2 100%   BMI 23.53 kg/m  Wt Readings from Last 3 Encounters:  09/18/23 183 lb 4 oz (83.1 kg)  09/12/23 190 lb 12.8 oz (86.5 kg)  06/21/23 196 lb (88.9 kg)    Physical Exam   Gen: WDWN NAD HEENT: NCAT, conjunctiva not injected, sclera nonicteric TM WNL B-tube still in L TM, OP moist, no exudates  NECK:  supple, no thyromegaly, no nodes,  CARDIAC: RRR, S1S2+, no murmur.  LUNGS: CTAB. Few faint, exp wheezes. EXT:  no edema MSK: no gross abnormalities.  NEURO: A&O x3.  CN II-XII intact.  PSYCH: normal mood. Good eye contact     Assessment & Plan:  Bronchitis  Other orders -     Cefdinir; Take 1 capsule (300 mg total) by mouth 2 (two) times daily.  Dispense: 14 capsule; Refill: 0  Assessment and Plan    Bronchitis Persistent symptoms following flu and COVID-19 infections include fever, malaise, and shortness of breath, with wheezing noted. Differential diagnosis includes viral versus bacterial bronchitis. Previous treatments included a Z-Pak and prednisone taper. A chest x-ray shows stable scarring. Due to potential bacterial bronchitis, cefdinir is prescribed for 7 days. Risks such as gastrointestinal upset and benefits of targeting bacterial infection were discussed. Monitor symptoms and seek medical attention if the condition worsens. Continue using inhalers as needed. Maintain good hygiene and avoid close contact with family members for a few more days. also advised that immune system is down and hit w/2 viruses in a row so can expect a longer recovery.  Asthma Asthma is managed with Breo, albuterol, and montelukast. A recent exacerbation was  managed with a prednisone taper. No recent albuterol use indicates improvement. The importance of having prednisone available for future exacerbations was discussed. Continue current asthma medications and ensure prednisone is available for future exacerbations.  General Health Maintenance The importance of hygiene and mask-wearing to prevent infections was discussed. Encourage frequent hand washing and surface cleaning. Advise wearing a mask at home if possible.  Follow-up Follow up if symptoms persist or worsen. Go to the ER if experiencing acute distress.        Return if symptoms worsen or fail to improve.  Angelena Sole, MD

## 2023-10-01 DIAGNOSIS — L820 Inflamed seborrheic keratosis: Secondary | ICD-10-CM | POA: Diagnosis not present

## 2023-10-07 ENCOUNTER — Encounter: Payer: Medicare Other | Admitting: Family Medicine

## 2023-10-31 DIAGNOSIS — H93292 Other abnormal auditory perceptions, left ear: Secondary | ICD-10-CM | POA: Diagnosis not present

## 2023-10-31 DIAGNOSIS — Z9622 Myringotomy tube(s) status: Secondary | ICD-10-CM | POA: Diagnosis not present

## 2023-10-31 DIAGNOSIS — H6993 Unspecified Eustachian tube disorder, bilateral: Secondary | ICD-10-CM | POA: Diagnosis not present

## 2023-11-11 DIAGNOSIS — M180 Bilateral primary osteoarthritis of first carpometacarpal joints: Secondary | ICD-10-CM | POA: Diagnosis not present

## 2023-11-11 DIAGNOSIS — S63253S Unspecified dislocation of left middle finger, sequela: Secondary | ICD-10-CM | POA: Diagnosis not present

## 2023-11-11 DIAGNOSIS — M720 Palmar fascial fibromatosis [Dupuytren]: Secondary | ICD-10-CM | POA: Diagnosis not present

## 2023-11-11 DIAGNOSIS — M79642 Pain in left hand: Secondary | ICD-10-CM | POA: Diagnosis not present

## 2023-11-20 ENCOUNTER — Encounter (HOSPITAL_COMMUNITY): Payer: Self-pay

## 2023-11-27 ENCOUNTER — Ambulatory Visit

## 2023-11-29 ENCOUNTER — Encounter: Payer: Self-pay | Admitting: Family Medicine

## 2023-11-29 MED ORDER — PREDNISONE 20 MG PO TABS
ORAL_TABLET | ORAL | 0 refills | Status: DC
Start: 2023-11-29 — End: 2023-12-05

## 2023-12-02 ENCOUNTER — Other Ambulatory Visit: Payer: Self-pay | Admitting: Pulmonary Disease

## 2023-12-02 ENCOUNTER — Other Ambulatory Visit: Payer: Self-pay | Admitting: Family Medicine

## 2023-12-02 NOTE — Telephone Encounter (Signed)
 Dr. Waylan Haggard, please advise if okay to refill. Thanks

## 2023-12-05 ENCOUNTER — Ambulatory Visit (INDEPENDENT_AMBULATORY_CARE_PROVIDER_SITE_OTHER)

## 2023-12-05 VITALS — Ht 74.0 in | Wt 183.0 lb

## 2023-12-05 DIAGNOSIS — Z0000 Encounter for general adult medical examination without abnormal findings: Secondary | ICD-10-CM | POA: Diagnosis not present

## 2023-12-05 NOTE — Progress Notes (Signed)
 Subjective:   Edward Lee is a 68 y.o. who presents for a Medicare Wellness preventive visit.  As a reminder, Annual Wellness Visits don't include a physical exam, and some assessments may be limited, especially if this visit is performed virtually. We may recommend an in-person follow-up visit with your provider if needed.  Visit Complete: Virtual I connected with  Edward Lee on 12/05/23 by a audio enabled telemedicine application and verified that I am speaking with the correct person using two identifiers.  Patient Location: Home  Provider Location: Office/Clinic  I discussed the limitations of evaluation and management by telemedicine. The patient expressed understanding and agreed to proceed.  Vital Signs: Because this visit was a virtual/telehealth visit, some criteria may be missing or patient reported. Any vitals not documented were not able to be obtained and vitals that have been documented are patient reported.  VideoDeclined- This patient declined Librarian, academic. Therefore the visit was completed with audio only.  Persons Participating in Visit: Patient.  AWV Questionnaire: No: Patient Medicare AWV questionnaire was not completed prior to this visit.  Cardiac Risk Factors include: advanced age (>28men, >14 women);dyslipidemia;male gender     Objective:     Today's Vitals   12/05/23 1143  Weight: 183 lb (83 kg)  Height: 6\' 2"  (1.88 m)   Body mass index is 23.5 kg/m.     12/05/2023   11:50 AM 10/22/2022   11:06 AM 04/17/2021    8:04 AM 04/17/2021    4:41 AM 08/04/2019   10:47 PM 04/26/2015    7:46 AM 04/26/2015    7:39 AM  Advanced Directives  Does Patient Have a Medical Advance Directive? Yes Yes No No No Yes No  Type of Estate agent of [place];Living will Healthcare Power of [place];Living will    Living will   Copy of Healthcare Power of Attorney in Chart? No - copy requested No - copy requested        Would patient like information on creating a medical advance directive?   No - Patient declined  Yes (ED - Information included in AVS) No - patient declined information No - patient declined information    Current Medications (verified) Outpatient Encounter Medications as of 12/05/2023  Medication Sig   Ascorbic Acid (VITAMIN C) 1000 MG tablet Take 1,000 mg by mouth daily.   citalopram  (CELEXA ) 20 MG tablet TAKE ONE TABLET BY MOUTH DAILY   fluticasone  furoate-vilanterol (BREO ELLIPTA ) 200-25 MCG/ACT AEPB Inhale 1 puff into the lungs daily. NEEDS APPT FOR REFILLS   ipratropium (ATROVENT ) 0.06 % nasal spray SMARTSIG:2 Spray(s) Both Nares 4 Times Daily PRN   Multiple Vitamin (MULTIVITAMIN WITH MINERALS) TABS tablet Take 1 tablet by mouth daily.   omeprazole (PRILOSEC) 20 MG capsule Take 20 mg by mouth daily.   simvastatin  (ZOCOR ) 40 MG tablet Take 1 tablet (40 mg total) by mouth at bedtime.   VENTOLIN  HFA 108 (90 Base) MCG/ACT inhaler USE 2 PUFFS EVERY 6 HOURS AS NEEDED FOR SHORTNESS OF BREATH AND WHEEZING.   [DISCONTINUED] budesonide (PULMICORT) 0.5 MG/2ML nebulizer solution Use half of saline irrigation through the nose. Then, mix respule in remainder and irrigate with that volume. Repeat twice daily.   [DISCONTINUED] cefdinir  (OMNICEF ) 300 MG capsule Take 1 capsule (300 mg total) by mouth 2 (two) times daily.   [DISCONTINUED] meloxicam  (MOBIC ) 15 MG tablet Take 1 tablet (15 mg total) by mouth daily. (Patient not taking: Reported on 09/18/2023)   [DISCONTINUED]  montelukast  (SINGULAIR ) 10 MG tablet Take 1 tablet (10 mg total) by mouth at bedtime.   [DISCONTINUED] predniSONE  (DELTASONE ) 20 MG tablet Take 2 pills for 3 days, 1 pill for 4 days   No facility-administered encounter medications on file as of 12/05/2023.    Allergies (verified) Codeine   History: Past Medical History:  Diagnosis Date   Allergy     Asthma    Chronic lower back pain    Herniated disc    L1    Hypercholesteremia    IBS (irritable bowel syndrome)    ? possible   Prostate cancer [facility])    Past Surgical History:  Procedure Laterality Date   CATARACT EXTRACTION     COLONOSCOPY     PILONIDAL CYST EXCISION  68 yrs old   PROSTATE BIOPSY  02/11/15   Family History  Problem Relation Age of Onset   Breast cancer Mother        survived without recurrence   Prostate cancer Father        around age 52 -surgeyr   Dementia Father        died from this at age 14   Healthy Sister    Healthy Brother    Hemochromatosis Son        none in patient   Down syndrome Son    Colon cancer Neg Hx    Esophageal cancer Neg Hx    Rectal cancer Neg Hx    Stomach cancer Neg Hx    Pancreatic cancer Neg Hx    Social History   Socioeconomic History   Marital status: Married    Spouse name: Not on file   Number of children: 2   Years of education: Not on file   Highest education level: Not on file  Occupational History   Occupation: Emergency planning/management officer  Tobacco Use   Smoking status: Never    Passive exposure: Never   Smokeless tobacco: Never  Vaping Use   Vaping status: Never Used  Substance and Sexual Activity   Alcohol use: Yes    Alcohol/week: 14.0 - 15.0 standard drinks of alcohol    Types: 7 - 8 Glasses of wine, 7 Standard drinks or equivalent per week    Comment: occ   Drug use: No   Sexual activity: Yes  Other Topics Concern   Not on file  Social History Narrative   Married 75. Son with downs syndrome and daughter is deaf.       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       Hobbies: enjoys walking/exercise, painting, gardening.    Social Drivers of Corporate investment banker Strain: Low Risk  (12/05/2023)   Overall Financial Resource Strain (CARDIA)    Difficulty of Paying Living Expenses: Not hard at all  Food Insecurity: No Food Insecurity (12/05/2023)   Hunger Vital Sign    Worried About Running Out of Food in the Last Year: Never true    Ran  Out of Food in the Last Year: Never true  Transportation Needs: No Transportation Needs (12/05/2023)   PRAPARE - Administrator, Civil Service (Medical): No    Lack of Transportation (Non-Medical): No  Physical Activity: Sufficiently Active (12/05/2023)   Exercise Vital Sign    Days of Exercise per Week: 6 days    Minutes of Exercise per Session: 90 min  Stress: No Stress Concern Present (12/05/2023)   [employer] of Occupational Health - Occupational Stress Questionnaire  Feeling of Stress : Not at all  Social Connections: Socially Integrated (12/05/2023)   Social Connection and Isolation Panel [NHANES]    Frequency of Communication with Friends and Family: More than three times a week    Frequency of Social Gatherings with Friends and Family: More than three times a week    Attends Religious Services: More than 4 times per year    Active Member of [employer] or Organizations: Yes    Attends Banker Meetings: 1 to 4 times per year    Marital Status: Married    Tobacco Counseling Counseling given: Not Answered    Clinical Intake:  Pre-visit preparation completed: Yes  Pain : No/denies pain     BMI - recorded: 23.5 Nutritional Status: BMI of 19-24  Normal Diabetes: No  No results found for: "HGBA1C"   How often do you need to have someone help you when you read instructions, pamphlets, or other written materials from your doctor or pharmacy?: 1 - Never  Interpreter Needed?: No  Information entered by :: Lamont Pilsner, LPN   Activities of Daily Living     12/05/2023   11:44 AM  In your present state of health, do you have any difficulty performing the following activities:  Hearing? 0  Vision? 0  Difficulty concentrating or making decisions? 0  Walking or climbing stairs? 0  Dressing or bathing? 0  Doing errands, shopping? 0  Preparing Food and eating ? N  Using the Toilet? N  In the past six months, have you accidently leaked urine? N   Do you have problems with loss of bowel control? N  Managing your Medications? N  Managing your Finances? N  Housekeeping or managing your Housekeeping? N    Patient Care Team: Almira Jaeger, MD as PCP - General (Family Medicine)  Indicate any recent Medical Services you may have received from other than [facility] providers in the past year (date may be approximate).     Assessment:    This is a routine wellness examination for Fredrich.  Hearing/Vision screen Hearing Screening - Comments:: Pt denies any hearing issues  Vision Screening - Comments:: Wears rx glasses - up to date with routine eye exams with Dr Candi Chafe     Goals Addressed             This Visit's Progress    Patient Stated       Maintain health and activity        Depression Screen     12/05/2023   11:47 AM 09/12/2023   10:27 AM 10/22/2022   11:05 AM 09/18/2022    2:01 PM 08/29/2022    9:00 AM 08/16/2022   11:26 AM 02/23/2022    4:33 PM  PHQ 2/9 Scores  PHQ - 2 Score 0 0 0 0 0 0 0  PHQ- 9 Score   0 0 0 0     Fall Risk     12/05/2023   11:48 AM 09/12/2023   10:27 AM 10/22/2022   11:07 AM 09/18/2022    2:01 PM 08/29/2022    9:00 AM  Fall Risk   Falls in the past year? 0 0 0 0 0  Number falls in past yr: 0 0 0 0 0  Injury with Fall? 0 0 0 0 0  Risk for fall due to : No Fall Risks No Fall Risks Impaired vision No Fall Risks No Fall Risks  Follow up  Falls prevention discussed Falls prevention discussed Falls  evaluation completed     MEDICARE RISK AT HOME:  Medicare Risk at Home Any stairs in or around the home?: No If so, are there any without handrails?: No Home free of loose throw rugs in walkways, pet beds, electrical cords, etc?: Yes Adequate lighting in your home to reduce risk of falls?: Yes Life alert?: No Use of a cane, walker or w/c?: No Grab bars in the bathroom?: No Shower chair or bench in shower?: No Elevated toilet seat or a handicapped toilet?: No  TIMED UP AND GO:  Was the test  performed?  No  Cognitive Function: 6CIT completed        12/05/2023   11:49 AM 10/22/2022   11:08 AM  6CIT Screen  What Year? 0 points 0 points  What month? 0 points 0 points  What time? 0 points 0 points  Count back from 20 0 points 0 points  Months in reverse 0 points 0 points  Repeat phrase 0 points 0 points  Total Score 0 points 0 points    Immunizations Immunization History  Administered Date(s) Administered   Moderna Covid-19 Fall Seasonal Vaccine 24yrs & older 06/14/2022   PFIZER(Purple Top)SARS-COV-2 Vaccination 08/20/2019, 09/10/2019, 05/10/2020   Pneumococcal Conjugate-13 05/24/2020   Pneumococcal Polysaccharide-23 12/04/2018   Td 01/14/1997, 01/20/2008   Tdap 06/17/2018    Screening Tests Health Maintenance  Topic Date Due   COVID-19 Vaccine (5 - 2024-25 season) 03/17/2023   Hepatitis C Screening  07/12/2098 (Originally 05/06/1974)   INFLUENZA VACCINE  02/14/2024   Medicare Annual Wellness (AWV)  12/04/2024   Pneumonia Vaccine 28+ Years old (3 of 3 - PCV20 or PCV21) 05/24/2025   Colonoscopy  05/09/2026   DTaP/Tdap/Td (4 - Td or Tdap) 06/17/2028   HPV VACCINES  Aged Out   Meningococcal B Vaccine  Aged Out   Zoster Vaccines- Shingrix  Discontinued    Health Maintenance  Health Maintenance Due  Topic Date Due   COVID-19 Vaccine (5 - 2024-25 season) 03/17/2023   Health Maintenance Items Addressed: See Nurse Notes  Additional Screening:  Vision Screening: Recommended annual ophthalmology exams for early detection of glaucoma and other disorders of the eye.  Dental Screening: Recommended annual dental exams for proper oral hygiene  Community Resource Referral / Chronic Care Management: CRR required this visit?  No   CCM required this visit?  No   Plan:    I have personally reviewed and noted the following in the patient's chart:   Medical and social history Use of alcohol, tobacco or illicit drugs  Current medications and supplements  including opioid prescriptions. Patient is not currently taking opioid prescriptions. Functional ability and status Nutritional status Physical activity Advanced directives List of other physicians Hospitalizations, surgeries, and ER visits in previous 12 months Vitals Screenings to include cognitive, depression, and falls Referrals and appointments  In addition, I have reviewed and discussed with patient certain preventive protocols, quality metrics, and best practice recommendations. A written personalized care plan for preventive services as well as general preventive health recommendations were provided to patient.   Bruno Capri, LPN   9/60/4540   After Visit Summary: (MyChart) Due to this being a telephonic visit, the after visit summary with patients personalized plan was offered to patient via MyChart   Notes: Nothing significant to report at this time.

## 2023-12-05 NOTE — Patient Instructions (Signed)
 Mr. Edward Lee , Thank you for taking time out of your busy schedule to complete your Annual Wellness Visit with me. I enjoyed our conversation and look forward to speaking with you again next year. I, as well as your care team,  appreciate your ongoing commitment to your health goals. Please review the following plan we discussed and let me know if I can assist you in the future. Your Game plan/ To Do List    Referrals: If you haven't heard from the office you've been referred to, please reach out to them at the phone provided.   Follow up Visits: Next Medicare AWV with our clinical staff: 12/17/24   Have you seen your provider in the last 6 months (3 months if uncontrolled diabetes)? No Next Office Visit with your provider: 01/28/24  Clinician Recommendations:  Aim for 30 minutes of exercise or brisk walking, 6-8 glasses of water, and 5 servings of fruits and vegetables each day.       This is a list of the screening recommended for you and due dates:  Health Maintenance  Topic Date Due   COVID-19 Vaccine (5 - 2024-25 season) 03/17/2023   Hepatitis C Screening  07/12/2098*   Flu Shot  02/14/2024   Medicare Annual Wellness Visit  12/04/2024   Pneumonia Vaccine (3 of 3 - PCV20 or PCV21) 05/24/2025   Colon Cancer Screening  05/09/2026   DTaP/Tdap/Td vaccine (4 - Td or Tdap) 06/17/2028   HPV Vaccine  Aged Out   Meningitis B Vaccine  Aged Out   Zoster (Shingles) Vaccine  Discontinued  *Topic was postponed. The date shown is not the original due date.    Advanced directives: (Copy Requested) Please bring a copy of your health care power of attorney and living will to the office to be added to your chart at your convenience. You can mail to [facility] [address] [place], [place] [address] or email to [email or web address] Advance Care Planning is important because it:  [x]  Makes sure you receive the medical care that is consistent with your values, goals, and  preferences  [x]  It provides guidance to your family and loved ones and reduces their decisional burden about whether or not they are making the right decisions based on your wishes.  Follow the link provided in your after visit summary or read over the paperwork we have mailed to you to help you started getting your Advance Directives in place. If you need assistance in completing these, please reach out to us  so that we can help you!  See attachments for Preventive Care and Fall Prevention Tips.

## 2023-12-10 ENCOUNTER — Other Ambulatory Visit (HOSPITAL_COMMUNITY): Payer: Self-pay | Admitting: Urology

## 2023-12-10 DIAGNOSIS — C61 Malignant neoplasm of prostate: Secondary | ICD-10-CM

## 2023-12-16 DIAGNOSIS — K080 Exfoliation of teeth due to systemic causes: Secondary | ICD-10-CM | POA: Diagnosis not present

## 2023-12-19 ENCOUNTER — Ambulatory Visit (HOSPITAL_COMMUNITY)
Admission: RE | Admit: 2023-12-19 | Discharge: 2023-12-19 | Disposition: A | Discharge status: Home / Self Care | Source: Ambulatory Visit | Attending: Urology | Admitting: Urology

## 2023-12-19 DIAGNOSIS — C61 Malignant neoplasm of prostate: Secondary | ICD-10-CM | POA: Insufficient documentation

## 2023-12-19 DIAGNOSIS — N400 Enlarged prostate without lower urinary tract symptoms: Secondary | ICD-10-CM | POA: Diagnosis not present

## 2023-12-19 MED ORDER — GADOBUTROL 1 MMOL/ML IV SOLN
8.0000 mL | Freq: Once | INTRAVENOUS | Status: AC | PRN
  Administered 2023-12-19: 8 mL via INTRAVENOUS

## 2024-01-28 ENCOUNTER — Ambulatory Visit (INDEPENDENT_AMBULATORY_CARE_PROVIDER_SITE_OTHER): Payer: Medicare Other | Admitting: Family Medicine

## 2024-01-28 ENCOUNTER — Ambulatory Visit: Payer: Self-pay | Admitting: Family Medicine

## 2024-01-28 ENCOUNTER — Encounter: Payer: Self-pay | Admitting: Family Medicine

## 2024-01-28 VITALS — BP 100/64 | HR 71 | Temp 97.6°F | Ht 74.0 in | Wt 183.0 lb

## 2024-01-28 DIAGNOSIS — E785 Hyperlipidemia, unspecified: Secondary | ICD-10-CM

## 2024-01-28 DIAGNOSIS — Z0000 Encounter for general adult medical examination without abnormal findings: Secondary | ICD-10-CM

## 2024-01-28 DIAGNOSIS — J4550 Severe persistent asthma, uncomplicated: Secondary | ICD-10-CM

## 2024-01-28 DIAGNOSIS — I700 Atherosclerosis of aorta: Secondary | ICD-10-CM | POA: Diagnosis not present

## 2024-01-28 LAB — CBC WITH DIFFERENTIAL/PLATELET
Basophils Absolute: 0 K/uL (ref 0.0–0.1)
Basophils Relative: 0.5 % (ref 0.0–3.0)
Eosinophils Absolute: 0.8 K/uL — ABNORMAL HIGH (ref 0.0–0.7)
Eosinophils Relative: 9.4 % — ABNORMAL HIGH (ref 0.0–5.0)
HCT: 42.8 % (ref 39.0–52.0)
Hemoglobin: 14.4 g/dL (ref 13.0–17.0)
Lymphocytes Relative: 17.6 % (ref 12.0–46.0)
Lymphs Abs: 1.4 K/uL (ref 0.7–4.0)
MCHC: 33.6 g/dL (ref 30.0–36.0)
MCV: 90.9 fl (ref 78.0–100.0)
Monocytes Absolute: 0.4 K/uL (ref 0.1–1.0)
Monocytes Relative: 4.7 % (ref 3.0–12.0)
Neutro Abs: 5.5 K/uL (ref 1.4–7.7)
Neutrophils Relative %: 67.8 % (ref 43.0–77.0)
Platelets: 213 K/uL (ref 150.0–400.0)
RBC: 4.71 Mil/uL (ref 4.22–5.81)
RDW: 13.5 % (ref 11.5–15.5)
WBC: 8.2 K/uL (ref 4.0–10.5)

## 2024-01-28 LAB — COMPREHENSIVE METABOLIC PANEL WITH GFR
ALT: 16 U/L (ref 0–53)
AST: 23 U/L (ref 0–37)
Albumin: 4.2 g/dL (ref 3.5–5.2)
Alkaline Phosphatase: 34 U/L — ABNORMAL LOW (ref 39–117)
BUN: 22 mg/dL (ref 6–23)
CO2: 30 meq/L (ref 19–32)
Calcium: 9.4 mg/dL (ref 8.4–10.5)
Chloride: 102 meq/L (ref 96–112)
Creatinine, Ser: 1.16 mg/dL (ref 0.40–1.50)
GFR: 64.17 mL/min (ref >60.00)
Glucose, Bld: 101 mg/dL — ABNORMAL HIGH (ref 70–99)
Potassium: 4.1 meq/L (ref 3.5–5.1)
Sodium: 138 meq/L (ref 135–145)
Total Bilirubin: 0.6 mg/dL (ref 0.2–1.2)
Total Protein: 7.2 g/dL (ref 6.0–8.3)

## 2024-01-28 LAB — LIPID PANEL
Cholesterol: 152 mg/dL (ref 0–200)
HDL: 73.9 mg/dL (ref >39.00)
LDL Cholesterol: 59 mg/dL (ref 0–99)
NonHDL: 77.93
Total CHOL/HDL Ratio: 2
Triglycerides: 96 mg/dL (ref 0.0–149.0)
VLDL: 19.2 mg/dL (ref 0.0–40.0)

## 2024-01-28 MED ORDER — OMEPRAZOLE 20 MG PO CPDR: 20.0000 mg | DELAYED_RELEASE_CAPSULE | Freq: Every day | ORAL | 3 refills | Status: DC

## 2024-01-28 MED ORDER — CITALOPRAM HYDROBROMIDE 20 MG PO TABS: 20.0000 mg | ORAL_TABLET | Freq: Every day | ORAL | 3 refills | Status: AC

## 2024-01-28 NOTE — Patient Instructions (Addendum)
 Please stop by lab before you go If you have mychart- we will send your results within 3 business days of us  receiving them.  If you do not have mychart- we will call you about results within 5 business days of us  receiving them.  *please also note that you will see labs on mychart as soon as they post. I will later go in and write notes on them- will say notes from Dr. Katrinka   No changes - glad you have had a good stretch  Recommended follow up: Return in about 1 year (around 01/27/2025) for physical or sooner if needed.Schedule b4 you leave.

## 2024-01-28 NOTE — Progress Notes (Signed)
 Phone: [phone]   Subjective:  Patient presents today for their annual physical. Chief complaint-noted.   See problem oriented charting- ROS- full  review of systems was completed and negative  Per full ROS sheet completed by patient except for topics noted under acute/chronic concerns  The following were reviewed and entered/updated in epic: Past Medical History:  Diagnosis Date   Allergy     Asthma    Chronic lower back pain    Herniated disc    L1   Hypercholesteremia    IBS (irritable bowel syndrome)    ? possible   Prostate cancer [facility])    Patient Active Problem List   Diagnosis Date Noted   Multifocal pneumonia 08/05/2019    Priority: High   Severe persistent asthma 11/17/2018    Priority: High   Malignant neoplasm of prostate (HCC) 04/26/2015    Priority: High   Anosmia 08/16/2020    Priority: Medium    Aortic atherosclerosis (HCC) 08/10/2019    Priority: Medium    Coronary artery calcification 08/10/2019    Priority: Medium    Tinea cruris 08/05/2019    Priority: Medium    Chronic low back pain 01/10/2017    Priority: Medium    Urinary frequency 11/28/2011    Priority: Medium    Hyperlipidemia 03/21/2011    Priority: Medium    PTSD (post-traumatic stress disorder) 09/06/2010    Priority: Medium    Recurrent maxillary sinusitis 11/17/2018    Priority: Low   Other allergic rhinitis 11/17/2018    Priority: Low   IBS (irritable bowel syndrome)     Priority: Low   Asthma exacerbation 04/17/2021   Past Surgical History:  Procedure Laterality Date   CATARACT EXTRACTION     COLONOSCOPY     PILONIDAL CYST EXCISION  68 yrs old   PROSTATE BIOPSY  02/11/15    Family History  Problem Relation Age of Onset   Breast cancer Mother        survived without recurrence   Prostate cancer Father        around age 82 -surgeyr   Dementia Father        died from this at age 29   Healthy Sister    Healthy Brother    Hemochromatosis Son        none in patient    Down syndrome Son    Colon cancer Neg Hx    Esophageal cancer Neg Hx    Rectal cancer Neg Hx    Stomach cancer Neg Hx    Pancreatic cancer Neg Hx     Medications- reviewed and updated Current Outpatient Medications  Medication Sig Dispense Refill   Ascorbic Acid (VITAMIN C) 1000 MG tablet Take 1,000 mg by mouth daily.     fluticasone  furoate-vilanterol (BREO ELLIPTA ) 200-25 MCG/ACT AEPB Inhale 1 puff into the lungs daily. NEEDS APPT FOR REFILLS 90 each 3   ipratropium (ATROVENT ) 0.06 % nasal spray SMARTSIG:2 Spray(s) Both Nares 4 Times Daily PRN     Multiple Vitamin (MULTIVITAMIN WITH MINERALS) TABS tablet Take 1 tablet by mouth daily.     simvastatin  (ZOCOR ) 40 MG tablet Take 1 tablet (40 mg total) by mouth at bedtime. 90 tablet 3   VENTOLIN  HFA 108 (90 Base) MCG/ACT inhaler USE 2 PUFFS EVERY 6 HOURS AS NEEDED FOR SHORTNESS OF BREATH AND WHEEZING. 18 g 5   citalopram  (CELEXA ) 20 MG tablet Take 1 tablet (20 mg total) by mouth daily. 90 tablet 3   omeprazole  (PRILOSEC)  20 MG capsule Take 1 capsule (20 mg total) by mouth daily. 90 capsule 3   No current facility-administered medications for this visit.    Allergies-reviewed and updated Allergies  Allergen Reactions   Codeine     REACTION: nausea vomiting Other reaction(s): Unknown    Social History   Social History Narrative   Married 75. Son with downs syndrome and daughter is deaf.       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       Hobbies: enjoys walking/exercise, painting, gardening.    Objective  Objective:  BP 100/64   Pulse 71   Temp 97.6 F (36.4 C)   Ht 6' 2 (1.88 m)   Wt 183 lb (83 kg)   SpO2 95%   BMI 23.50 kg/m  Gen: NAD, resting comfortably HEENT: Mucous membranes are moist. Oropharynx normal Neck: no thyromegaly CV: RRR no murmurs rubs or gallops Lungs: CTAB no crackles, wheeze, rhonchi Abdomen: soft/nontender/nondistended/normal bowel sounds. No rebound or guarding.  Ext:  no edema Skin: warm, dry Neuro: grossly normal, moves all extremities, PERRLA   Assessment and Plan  68 y.o. male presenting for annual physical.  Health Maintenance counseling: 1. Anticipatory guidance: Patient counseled regarding regular dental exams -q6 months, eye exams -yearly,  avoiding smoking and second hand smoke , limiting alcohol to 2 beverages per day - 1-2 glasses per night, no illicit drugs .   2. Risk factor reduction:  Advised patient of need for regular exercise and diet rich and fruits and vegetables to reduce risk of heart attack and stroke.  Exercise- walking 5 miles a day consistently.  Diet/weight management-healthy weight with mediterranean diet.  Wt Readings from Last 3 Encounters:  01/28/24 183 lb (83 kg)  12/05/23 183 lb (83 kg)  09/18/23 183 lb 4 oz (83.1 kg)  3. Immunizations/screenings/ancillary studies- up to date but holding off on further COVID shots Immunization History  Administered Date(s) Administered   Moderna Covid-19 Fall Seasonal Vaccine 25yrs & older 06/14/2022   PFIZER(Purple Top)SARS-COV-2 Vaccination 08/20/2019, 09/10/2019, 05/10/2020   Pneumococcal Conjugate-13 05/24/2020   Pneumococcal Polysaccharide-23 12/04/2018   Td 01/14/1997, 01/20/2008   Tdap 06/17/2018  4. Prostate cancer screening- see below  5. Colon cancer screening - 05/09/16 with 10 year repeat 6. Skin cancer screening- Dr. Tricia as needed- just seen a few months agoadvised regular sunscreen use. Denies worrisome, changing, or new skin lesions.  7. Smoking associated screening (lung cancer screening, AAA screen 65-75, UA)- never smoker 8. STD screening - only active with wife  Status of chronic or acute concerns   # Low grade prostate cancer-under active surveillance with urology with PSA every 6 months with Dr. Devere with MRI 12/19/2023 most recently - just had PSA with them and has upcoming visit  # Severe persistent asthma-follows with Dr. Theophilus S:Medication: Ranell,  nasal rinse with budesonide to reduce polyp reformation .  Medications include albuterol  -very sick in February- COVID and bronchitis A/P: thankfully 2 months without flares- hopefully will persist- continue current medications  - follow up with Dr. Theophilus  #hyperlipidemia # Aortic atherosclerosis/coronary calcifications S: Medication: simvastatin  up to 40mg  march 2024- tried 20 mg due to fatigue then went back up but had recurrent fatigue - but later retrialed and tolearting Lab Results  Component Value Date   CHOL 165 09/13/2022   HDL 63.20 09/13/2022   LDLCALC 87 09/13/2022   TRIG 74.0 09/13/2022   CHOLHDL 3 09/13/2022  A/P: aortic atherosclerosis (presumed stable)-  LDL goal ideally <70 - hopefully stable even though slightly high- max tolerable dose likely at 40 mg  # GERD S:Medication: Omeprazole  20 mg-actually helped breathing issues as well. 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 Future Appointments  Date Time Provider Department Center  12/17/2024 10:40 AM [facility] ANNUAL WELLNESS VISIT 1 [facility]   Lab/Order associations:NOT fasting   ICD-10-CM   1. Preventative health care  Z00.00     2. Hyperlipidemia, unspecified hyperlipidemia type  E78.5 Comprehensive metabolic panel with GFR    CBC with Differential/Platelet    Lipid panel    3. Aortic atherosclerosis (HCC)  I70.0     4. Severe persistent asthma without complication  J45.50       Meds ordered this encounter  Medications   citalopram  (CELEXA ) 20 MG tablet    Sig: Take 1 tablet (20 mg total) by  mouth daily.    Dispense:  90 tablet    Refill:  3   omeprazole  (PRILOSEC) 20 MG capsule    Sig: Take 1 capsule (20 mg total) by mouth daily.    Dispense:  90 capsule    Refill:  3    Return precautions advised.  Garnette Lukes, MD

## 2024-01-30 DIAGNOSIS — N5201 Erectile dysfunction due to arterial insufficiency: Secondary | ICD-10-CM | POA: Diagnosis not present

## 2024-01-30 DIAGNOSIS — C61 Malignant neoplasm of prostate: Secondary | ICD-10-CM | POA: Diagnosis not present

## 2024-01-30 DIAGNOSIS — R350 Frequency of micturition: Secondary | ICD-10-CM | POA: Diagnosis not present

## 2024-01-30 DIAGNOSIS — N401 Enlarged prostate with lower urinary tract symptoms: Secondary | ICD-10-CM | POA: Diagnosis not present

## 2024-02-17 DIAGNOSIS — Z961 Presence of intraocular lens: Secondary | ICD-10-CM | POA: Diagnosis not present

## 2024-02-17 DIAGNOSIS — H43813 Vitreous degeneration, bilateral: Secondary | ICD-10-CM | POA: Diagnosis not present

## 2024-02-17 DIAGNOSIS — H35411 Lattice degeneration of retina, right eye: Secondary | ICD-10-CM | POA: Diagnosis not present

## 2024-03-17 ENCOUNTER — Other Ambulatory Visit: Payer: Self-pay | Admitting: Pulmonary Disease

## 2024-04-07 DIAGNOSIS — K080 Exfoliation of teeth due to systemic causes: Secondary | ICD-10-CM | POA: Diagnosis not present

## 2024-04-23 ENCOUNTER — Other Ambulatory Visit: Payer: Self-pay | Admitting: Pulmonary Disease

## 2024-04-28 ENCOUNTER — Other Ambulatory Visit (HOSPITAL_COMMUNITY): Payer: Self-pay

## 2024-04-28 MED ORDER — FLUTICASONE FUROATE-VILANTEROL 200-25 MCG/ACT IN AEPB
1.0000 | INHALATION_SPRAY | Freq: Every day | RESPIRATORY_TRACT | 0 refills | Status: DC
  Filled 2024-04-28: qty 60, 30d supply, fill #0

## 2024-05-05 ENCOUNTER — Other Ambulatory Visit (HOSPITAL_COMMUNITY): Payer: Self-pay

## 2024-05-28 ENCOUNTER — Other Ambulatory Visit (HOSPITAL_COMMUNITY): Payer: Self-pay

## 2024-05-28 ENCOUNTER — Other Ambulatory Visit: Payer: Self-pay | Admitting: Pulmonary Disease

## 2024-05-28 MED ORDER — FLUTICASONE FUROATE-VILANTEROL 200-25 MCG/ACT IN AEPB
1.0000 | INHALATION_SPRAY | Freq: Every day | RESPIRATORY_TRACT | 0 refills | Status: DC
  Filled 2024-05-28: qty 60, 30d supply, fill #0

## 2024-05-29 ENCOUNTER — Telehealth (INDEPENDENT_AMBULATORY_CARE_PROVIDER_SITE_OTHER): Admitting: Pulmonary Disease

## 2024-05-29 ENCOUNTER — Other Ambulatory Visit (HOSPITAL_COMMUNITY): Payer: Self-pay

## 2024-05-29 ENCOUNTER — Encounter: Payer: Self-pay | Admitting: Pulmonary Disease

## 2024-05-29 VITALS — Ht 72.0 in | Wt 190.0 lb

## 2024-05-29 DIAGNOSIS — J4551 Severe persistent asthma with (acute) exacerbation: Secondary | ICD-10-CM | POA: Diagnosis not present

## 2024-05-29 MED ORDER — PREDNISONE 10 MG PO TABS: 40.0000 mg | ORAL_TABLET | Freq: Every day | ORAL | 0 refills | Status: AC

## 2024-05-29 NOTE — Progress Notes (Signed)
 Edward Lee    [id number]    12-02-1955  Primary Care Physician:Hunter, Garnette KIDD, MD  Referring Physician: Katrinka Garnette KIDD, MD [address] [place],  [place] [address]  Virtual Visit via Video Note  I connected with Edward Lee on 06/28/23 at  2:00 PM EST by a video enabled telemedicine application and verified that I am speaking with the correct person using two identifiers.  Location: Patient: Home Provider: Office   I discussed the limitations of evaluation and management by telemedicine and the availability of in person appointments. The patient expressed understanding and agreed to proceed.  Chief complaint:  Follow up for asthma  HPI: 68 y.o. with history of allergies, hyperlipidemia, irritable bowel syndrome, prostate cancer.  Referred from recurrent exacerbations throughout 2019 Inhalers changed to [facility] and Spiriva . He has history of seasonal allergies, denies acid reflux.  There is a strong history of asthma in the family.   Has hospitalized for community-acquired pneumonia at the end of January 2021. He tested negative for COVID 19 and was treated with azithromycin , Augmentin  with improvement in symptoms Follow-up chest x-ray showed improvement in pulmonary infiltrates Sees Dr. Carlie, ENT for recurrent sinusitis and nasal polyps. Patient underwent nasal polyp surgery in April 2022 with Dr. Carlie   Interim History: Discussed the use of AI scribe software for clinical note transcription with the patient, who gave verbal consent to proceed.  History of Present Illness Edward Lee is a 68 year old male with asthma who presents for routine follow-up.  Asthma control and exacerbation history - Asthma remains well-controlled with no exacerbations over the summer, deviating from his usual pattern of exacerbations every three months. - Effectively utilizes a prednisone  taper protocol as needed for exacerbations. - Last prednisone  use occurred at  the end of summer or early September.  Respiratory infections - Contracted both influenza and COVID-19 in February without experiencing asthma attacks.  Medication management - Requests new prescriptions for Breo and prednisone . - Prefers 60 tablets of 10 mg prednisone , which he uses sparingly.   Relevant pulmonary history Pets: Dogs, no cats, birds Occupation: Works for the [employer].  Works from home Exposures: No known exposures.  No mold at home.  Hardwood, carpet at home.  Forced air heating Smoking history: Never smoker  Travel History: Traveled to [place] , [place] recently.  No other significant travel.  Outpatient Encounter Medications as of 05/29/2024  Medication Sig   Ascorbic Acid (VITAMIN C) 1000 MG tablet Take 1,000 mg by mouth daily.   citalopram  (CELEXA ) 20 MG tablet Take 1 tablet (20 mg total) by mouth daily.   fluticasone  furoate-vilanterol (BREO ELLIPTA ) 200-25 MCG/ACT AEPB Inhale 1 puff into the lungs daily.   Multiple Vitamin (MULTIVITAMIN WITH MINERALS) TABS tablet Take 1 tablet by mouth daily.   omeprazole  (PRILOSEC) 20 MG capsule Take 1 capsule (20 mg total) by mouth daily.   simvastatin  (ZOCOR ) 40 MG tablet Take 1 tablet (40 mg total) by mouth at bedtime.   VENTOLIN  HFA 108 (90 Base) MCG/ACT inhaler USE 2 PUFFS EVERY 6 HOURS AS NEEDED FOR SHORTNESS OF BREATH AND WHEEZING.   BREO ELLIPTA  200-25 MCG/ACT AEPB USE ONE PUFF into lungs DAILY **NEED OFFICE VISIT** (Patient not taking: Reported on 05/29/2024)   ipratropium (ATROVENT ) 0.06 % nasal spray SMARTSIG:2 Spray(s) Both Nares 4 Times Daily PRN (Patient not taking: Reported on 05/29/2024)   [DISCONTINUED] fluticasone  furoate-vilanterol (BREO ELLIPTA ) 200-25 MCG/ACT AEPB Inhale 1 puff  into the lungs daily.   No facility-administered encounter medications on file as of 05/29/2024.   Physical Exam: Tele  Data Reviewed: Imaging: Chest x-ray 08/04/2019-bilateral groundglass opacities, consolidation. CTA  08/05/2019-no pulmonary embolism, extensive upper lobe predominant bilateral consolidation groundglass with air bronchograms.  Mediastinal lymphadenopathy Chest x-ray 09/22/19-provement in lung opacities with streaky residual opacities in the upper lobe. Chest x-ray 08/30/21 - prominent bronchial markings. No consolidation of pleural effusion. I have reviewed the images personally.  PFTs: 08/27/17 FVC 6.18 [117%], FEV1 5.09 [128%], F/F 82, TLC 112%, DLCO 94% Normal study.  FENO  07/25/17- 54 08/27/2017-85 11/22/2017-99 11/28/2017-157 02/27/2018-55  ACQ 7 02/12/2019- 0.43  ACT score  09/25/2019-23 02/08/2021-11  Labs: CBC 12/14/14-WBC count 6.6, absolute eosinophil count 400  CBC 07/25/17-WBC count 9.1, absolute eosinophil count 500 Blood allergy  profile 07/25/17-IgE 58, RAST panel is negative.  Assessment:  Severe persistent asthma Symptoms are consistent with asthma with elevated FENO and CBCs showing eosinophilia. PFTs reviewed which do not show any obstruction.  There is improvement in mid flow rates post albuterol  indicating small airways disease  Continues on Breo and Singulair  He was previously on Spiriva  but self discontinued We had repeated discussions regarding biologics which would be a good option for him due to recurrent exacerbations. He was approved for Dupixent due to recurrent exacerbations but he prefers to hold off for now  Flare-ups approximately once every 4 to 5 months, usually weather-related. Patient self-manages with prednisone  taper (40-30-20-10mg ) which effectively returns symptoms to baseline. Patient prefers current management strategy over biologic therapy which was discussed with him and recommended in the past.  -Continue current management strategy.  Give a supply of prednisone  to keep at hand in case of any exacerbations  Plan/Recommendations: - Continue Breo, singular  Follow-up in 12 months  Lonna Coder MD [facility] 05/29/2024, 8:38 AM  CC: Katrinka Garnette KIDD, MD   I discussed the assessment and treatment plan with the patient. The patient was provided an opportunity to ask questions and all were answered. The patient agreed with the plan and demonstrated an understanding of the instructions.   The patient was advised to call back or seek an in-person evaluation if the symptoms worsen or if the condition fails to improve as anticipated.

## 2024-06-24 ENCOUNTER — Other Ambulatory Visit (HOSPITAL_COMMUNITY): Payer: Self-pay

## 2024-06-24 ENCOUNTER — Other Ambulatory Visit: Payer: Self-pay | Admitting: Pulmonary Disease

## 2024-06-24 MED ORDER — FLUTICASONE FUROATE-VILANTEROL 200-25 MCG/ACT IN AEPB
1.0000 | INHALATION_SPRAY | Freq: Every day | RESPIRATORY_TRACT | 3 refills | Status: AC
  Filled 2024-06-24: qty 60, 30d supply, fill #0
  Filled 2024-07-24: qty 60, 30d supply, fill #1

## 2024-07-24 ENCOUNTER — Other Ambulatory Visit: Payer: Self-pay

## 2024-07-25 ENCOUNTER — Encounter: Payer: Self-pay | Admitting: Family Medicine

## 2024-07-31 NOTE — Telephone Encounter (Signed)
 Copied from CRM [id number]. Topic: Appointments - Scheduling Inquiry for Clinic >> Jul 31, 2024 10:56 AM Mercedes MATSU wrote: Reason for CRM: Patient called in stating that he wanted to schedule a follow up virtual appointment with Dr. Katrinka. He does not have anything available until 08/19/2024 which the patient declined. Patient is requesting a sooner appointment and can be reached at [phone].

## 2024-07-31 NOTE — Telephone Encounter (Signed)
 Please advise on working this patient into your schedule.

## 2024-07-31 NOTE — Telephone Encounter (Signed)
 Pt stated he cannot wait until next available appt. Pt is asking for Katrinka to work him in somewhere. Please advise

## 2024-08-03 ENCOUNTER — Telehealth: Admitting: Family Medicine

## 2024-08-03 ENCOUNTER — Encounter: Payer: Self-pay | Admitting: Family Medicine

## 2024-08-03 VITALS — Ht 72.0 in

## 2024-08-03 DIAGNOSIS — K219 Gastro-esophageal reflux disease without esophagitis: Secondary | ICD-10-CM

## 2024-08-03 MED ORDER — OMEPRAZOLE 40 MG PO CPDR: 40.0000 mg | DELAYED_RELEASE_CAPSULE | Freq: Every day | ORAL | 1 refills | Status: AC

## 2024-08-03 NOTE — Patient Instructions (Addendum)
 Patient with history of GERD primarily presenting with cough now with some breakthrough symptoms/worsening control that are somewhat unique and the improvement with eating typically and with worsening symptoms after not eating for prolonged period-this actually makes me concerned for duodenal ulcer - We opted to increase his omeprazole  to 40 mg daily before breakfast -stop ibuprofen  which he has been taking once daily-he thinks he can tolerate being off of this and we discussed this could definitely be a trigger - Up to 2 months of treatment planned but he is not making improvement within 2 to 3 weeks I like to refer him to GI or certainly sooner if symptoms worsen-he agrees to reach out -Could also consider testing for H. pylori but more challenging on proton pump inhibitor (PPI) stomach acid reducer -In the long run he would like to get off medicine if possible -No exertional element or increased chest pain or shortness of breath and I think cardiac causes highly unlikely though we discussed if new or worsening symptoms to come in for in person evaluation or seek care immediately if drastically worsens  Recommended follow up: Return for as needed for new, worsening, persistent symptoms.

## 2024-08-03 NOTE — Progress Notes (Signed)
 " Phone [phone] Virtual visit via Video note   Subjective:  Chief complaint: Chief Complaint  Patient presents with   Heartburn    Pt would like to discuss chronic heartburn;     Our team/I connected with Edward Lee at  4:20 PM EST by a video enabled telemedicine application (caregility through epic) and verified that I am speaking with the correct person using two identifiers. Our team/I discussed the limitations of evaluation and management by telemedicine and the availability of in person appointments.No physical exam was performed (except for noted visual exam or audio findings with Telehealth visits).   Location patient: Home-[place] Location provider: [facility], office Persons participating in the virtual visit:  patient  Past Medical History-  Patient Active Problem List   Diagnosis Date Noted   Multifocal pneumonia 08/05/2019    Priority: High   Severe persistent asthma (HCC) 11/17/2018    Priority: High   Malignant neoplasm of prostate (HCC) 04/26/2015    Priority: High   Anosmia 08/16/2020    Priority: Medium    Aortic atherosclerosis 08/10/2019    Priority: Medium    Coronary artery calcification 08/10/2019    Priority: Medium    Tinea cruris 08/05/2019    Priority: Medium    Chronic low back pain 01/10/2017    Priority: Medium    Urinary frequency 11/28/2011    Priority: Medium    Hyperlipidemia 03/21/2011    Priority: Medium    PTSD (post-traumatic stress disorder) 09/06/2010    Priority: Medium    Recurrent maxillary sinusitis 11/17/2018    Priority: Low   Other allergic rhinitis 11/17/2018    Priority: Low   IBS (irritable bowel syndrome)     Priority: Low   Asthma exacerbation 04/17/2021    Medications- reviewed and updated Current Outpatient Medications  Medication Sig Dispense Refill   Ascorbic Acid (VITAMIN C) 1000 MG tablet Take 1,000 mg by mouth daily.     citalopram  (CELEXA ) 20 MG tablet Take 1 tablet (20 mg total) by mouth daily. 90  tablet 3   fluticasone  furoate-vilanterol (BREO ELLIPTA ) 200-25 MCG/ACT AEPB Inhale 1 puff into the lungs daily. 60 each 3   Multiple Vitamin (MULTIVITAMIN WITH MINERALS) TABS tablet Take 1 tablet by mouth daily.     omeprazole  (PRILOSEC) 40 MG capsule Take 1 capsule (40 mg total) by mouth daily. 30 capsule 1   simvastatin  (ZOCOR ) 40 MG tablet Take 1 tablet (40 mg total) by mouth at bedtime. 90 tablet 3   VENTOLIN  HFA 108 (90 Base) MCG/ACT inhaler USE 2 PUFFS EVERY 6 HOURS AS NEEDED FOR SHORTNESS OF BREATH AND WHEEZING. 18 g 5   BREO ELLIPTA  200-25 MCG/ACT AEPB USE ONE PUFF into lungs DAILY **NEED OFFICE VISIT** (Patient not taking: Reported on 08/03/2024) 90 each 0   ipratropium (ATROVENT ) 0.06 % nasal spray SMARTSIG:2 Spray(s) Both Nares 4 Times Daily PRN (Patient not taking: Reported on 08/03/2024)     No current facility-administered medications for this visit.     Objective:  Ht 6' (1.829 m)   BMI 25.77 kg/m  self reported vitals Gen: NAD, resting comfortably Lungs: nonlabored, normal respiratory rate  Skin: appears dry, no obvious rash     Assessment and Plan   # GERD/concern for ulcer S:Medication: Omeprazole  20 mg-actually helped breathing issues as well. On this since around 2023. Still taking after breakfast  Typically avoids spicy food. A few days before Christmas had mildly spicy dish (but not bad spicy) but started with severe  heart burn that evening bad enough that he was considering going to ER. He tried Tums and Prilosec.   Since then has had heartburn either severe or mild almost everyday. No specific pattern. Can have coffee or alcohol actually seems to get better. No particular food trigger. Irritation is from throat down to stomach. If stomach is empty it gets worse- 5 am in morning bothers him. Sometimes eating makes it better. Milkshakes or smoothies help a lot.   One tablet ibuprofen  daily. Has tried a supplement called slippery elm that helps slightly.   No  exertional element. No shortness of breath specifically with this outside of baseline asthma. No left arm or neck pain or shoulder pain. No dizziness or lightheadeness.  A/P: Patient with history of GERD primarily presenting with cough now with some breakthrough symptoms/worsening control that are somewhat unique and the improvement with eating typically and with worsening symptoms after not eating for prolonged period-this actually makes me concerned for duodenal ulcer - We opted to increase his omeprazole  to 40 mg daily before breakfast -stop ibuprofen  which he has been taking once daily-he thinks he can tolerate being off of this and we discussed this could definitely be a trigger - Up to 2 months of treatment planned but he is not making improvement within 2 to 3 weeks I like to refer him to GI or certainly sooner if symptoms worsen-he agrees to reach out -Could also consider testing for H. pylori but more challenging on proton pump inhibitor (PPI) stomach acid reducer -In the long run he would like to get off medicine if possible -No exertional element or increased chest pain or shortness of breath and I think cardiac causes highly unlikely though we discussed if new or worsening symptoms to come in for in person evaluation or seek care immediately if drastically worsens  Recommended follow up: Return for as needed for new, worsening, persistent symptoms. Future Appointments  Date Time Provider Department Center  12/17/2024 10:40 AM [facility] ANNUAL WELLNESS VISIT 1 [facility] Edward Lee  02/01/2025  1:00 PM Katrinka Garnette KIDD, MD [facility] Edward Lee    Lab/Order associations:   ICD-10-CM   1. Gastroesophageal reflux disease, unspecified whether esophagitis present  K21.9       Meds ordered this encounter  Medications   omeprazole  (PRILOSEC) 40 MG capsule    Sig: Take 1 capsule (40 mg total) by mouth daily.    Dispense:  30 capsule    Refill:  1    Return precautions advised.   Garnette Katrinka, MD  "

## 2024-08-03 NOTE — Telephone Encounter (Signed)
 LVM to schedule 4:20 virtual with Hunter. Please transfer to Direct[employer]

## 2024-12-17 ENCOUNTER — Ambulatory Visit

## 2025-02-01 ENCOUNTER — Encounter: Admitting: Family Medicine
